# Patient Record
Sex: Female | Born: 1955 | Hispanic: No | Marital: Single | Smoking: Never smoker
Health system: Southern US, Community
[De-identification: ages and names within clinical notes are randomized; demographics above are authoritative.]

## PROBLEM LIST (undated history)

## (undated) DIAGNOSIS — E785 Hyperlipidemia, unspecified: Secondary | ICD-10-CM

## (undated) DIAGNOSIS — C189 Malignant neoplasm of colon, unspecified: Secondary | ICD-10-CM

## (undated) DIAGNOSIS — I639 Cerebral infarction, unspecified: Secondary | ICD-10-CM

## (undated) DIAGNOSIS — K219 Gastro-esophageal reflux disease without esophagitis: Secondary | ICD-10-CM

## (undated) DIAGNOSIS — A159 Respiratory tuberculosis unspecified: Secondary | ICD-10-CM

## (undated) DIAGNOSIS — D649 Anemia, unspecified: Secondary | ICD-10-CM

## (undated) HISTORY — DX: Respiratory tuberculosis unspecified: A15.9

## (undated) HISTORY — DX: Anemia, unspecified: D64.9

## (undated) HISTORY — PX: COLONOSCOPY: SHX174

## (undated) HISTORY — PX: CHOLECYSTECTOMY: SHX55

## (undated) HISTORY — PX: COLON SURGERY: SHX602

## (undated) HISTORY — DX: Gastro-esophageal reflux disease without esophagitis: K21.9

## (undated) HISTORY — PX: PARTIAL HYSTERECTOMY: SHX80

## (undated) HISTORY — PX: UPPER GASTROINTESTINAL ENDOSCOPY: SHX188

## (undated) HISTORY — DX: Hyperlipidemia, unspecified: E78.5

---

## 1997-11-30 ENCOUNTER — Inpatient Hospital Stay (HOSPITAL_COMMUNITY): Admission: AD | Admit: 1997-11-30 | Discharge: 1997-11-30 | Payer: Self-pay | Admitting: Emergency Medicine

## 1997-12-07 ENCOUNTER — Other Ambulatory Visit: Admission: RE | Admit: 1997-12-07 | Discharge: 1997-12-07 | Payer: Self-pay | Admitting: Obstetrics

## 1997-12-07 ENCOUNTER — Ambulatory Visit (HOSPITAL_COMMUNITY): Admission: RE | Admit: 1997-12-07 | Discharge: 1997-12-07 | Payer: Self-pay | Admitting: Obstetrics

## 1998-08-20 ENCOUNTER — Inpatient Hospital Stay (HOSPITAL_COMMUNITY): Admission: AD | Admit: 1998-08-20 | Discharge: 1998-08-20 | Payer: Self-pay | Admitting: Obstetrics

## 1999-01-29 ENCOUNTER — Emergency Department (HOSPITAL_COMMUNITY): Admission: EM | Admit: 1999-01-29 | Discharge: 1999-01-29 | Payer: Self-pay | Admitting: Emergency Medicine

## 1999-03-25 ENCOUNTER — Encounter: Payer: Self-pay | Admitting: Emergency Medicine

## 1999-03-25 ENCOUNTER — Emergency Department (HOSPITAL_COMMUNITY): Admission: EM | Admit: 1999-03-25 | Discharge: 1999-03-25 | Payer: Self-pay | Admitting: Emergency Medicine

## 1999-09-01 DIAGNOSIS — E1165 Type 2 diabetes mellitus with hyperglycemia: Secondary | ICD-10-CM

## 1999-10-21 ENCOUNTER — Emergency Department (HOSPITAL_COMMUNITY): Admission: EM | Admit: 1999-10-21 | Discharge: 1999-10-22 | Payer: Self-pay | Admitting: *Deleted

## 1999-12-12 ENCOUNTER — Emergency Department (HOSPITAL_COMMUNITY): Admission: EM | Admit: 1999-12-12 | Discharge: 1999-12-12 | Payer: Self-pay | Admitting: Emergency Medicine

## 1999-12-26 ENCOUNTER — Emergency Department (HOSPITAL_COMMUNITY): Admission: EM | Admit: 1999-12-26 | Discharge: 1999-12-26 | Payer: Self-pay | Admitting: Emergency Medicine

## 1999-12-31 ENCOUNTER — Emergency Department (HOSPITAL_COMMUNITY): Admission: EM | Admit: 1999-12-31 | Discharge: 1999-12-31 | Payer: Self-pay | Admitting: Emergency Medicine

## 2000-02-06 ENCOUNTER — Encounter: Admission: RE | Admit: 2000-02-06 | Discharge: 2000-02-06 | Payer: Self-pay | Admitting: Family Medicine

## 2000-02-13 ENCOUNTER — Encounter: Payer: Self-pay | Admitting: *Deleted

## 2000-02-13 ENCOUNTER — Inpatient Hospital Stay (HOSPITAL_COMMUNITY): Admission: EM | Admit: 2000-02-13 | Discharge: 2000-02-17 | Payer: Self-pay | Admitting: *Deleted

## 2000-02-13 ENCOUNTER — Encounter: Payer: Self-pay | Admitting: Neurology

## 2000-02-14 ENCOUNTER — Encounter: Payer: Self-pay | Admitting: Neurology

## 2000-02-21 ENCOUNTER — Encounter: Admission: RE | Admit: 2000-02-21 | Discharge: 2000-05-21 | Payer: Self-pay | Admitting: Neurology

## 2000-04-13 ENCOUNTER — Encounter: Admission: RE | Admit: 2000-04-13 | Discharge: 2000-04-13 | Payer: Self-pay | Admitting: Family Medicine

## 2000-05-11 ENCOUNTER — Encounter: Admission: RE | Admit: 2000-05-11 | Discharge: 2000-08-09 | Payer: Self-pay | Admitting: *Deleted

## 2000-05-12 ENCOUNTER — Encounter: Admission: RE | Admit: 2000-05-12 | Discharge: 2000-05-12 | Payer: Self-pay | Admitting: Family Medicine

## 2000-05-26 ENCOUNTER — Emergency Department (HOSPITAL_COMMUNITY): Admission: EM | Admit: 2000-05-26 | Discharge: 2000-05-26 | Payer: Self-pay | Admitting: Emergency Medicine

## 2000-07-08 ENCOUNTER — Encounter: Payer: Self-pay | Admitting: Neurology

## 2000-07-08 ENCOUNTER — Inpatient Hospital Stay (HOSPITAL_COMMUNITY): Admission: EM | Admit: 2000-07-08 | Discharge: 2000-07-13 | Payer: Self-pay | Admitting: *Deleted

## 2000-07-08 ENCOUNTER — Encounter: Payer: Self-pay | Admitting: *Deleted

## 2000-07-13 ENCOUNTER — Encounter: Payer: Self-pay | Admitting: Physical Medicine & Rehabilitation

## 2000-07-13 ENCOUNTER — Inpatient Hospital Stay (HOSPITAL_COMMUNITY)
Admission: RE | Admit: 2000-07-13 | Discharge: 2000-07-29 | Payer: Self-pay | Admitting: Physical Medicine & Rehabilitation

## 2000-07-31 ENCOUNTER — Encounter: Admission: RE | Admit: 2000-07-31 | Discharge: 2000-07-31 | Payer: Self-pay | Admitting: Family Medicine

## 2000-08-03 ENCOUNTER — Encounter
Admission: RE | Admit: 2000-08-03 | Discharge: 2000-10-07 | Payer: Self-pay | Admitting: Physical Medicine & Rehabilitation

## 2000-08-07 ENCOUNTER — Encounter: Admission: RE | Admit: 2000-08-07 | Discharge: 2000-08-07 | Payer: Self-pay | Admitting: Family Medicine

## 2000-09-30 ENCOUNTER — Encounter: Admission: RE | Admit: 2000-09-30 | Discharge: 2000-09-30 | Payer: Self-pay | Admitting: Family Medicine

## 2000-10-08 ENCOUNTER — Encounter: Admission: RE | Admit: 2000-10-08 | Discharge: 2000-10-08 | Payer: Self-pay | Admitting: Family Medicine

## 2000-11-05 ENCOUNTER — Encounter: Admission: RE | Admit: 2000-11-05 | Discharge: 2000-11-05 | Payer: Self-pay | Admitting: Family Medicine

## 2000-11-06 ENCOUNTER — Encounter: Admission: RE | Admit: 2000-11-06 | Discharge: 2000-11-06 | Payer: Self-pay | Admitting: Family Medicine

## 2000-11-13 ENCOUNTER — Encounter: Admission: RE | Admit: 2000-11-13 | Discharge: 2000-11-13 | Payer: Self-pay | Admitting: Family Medicine

## 2000-11-20 ENCOUNTER — Encounter: Admission: RE | Admit: 2000-11-20 | Discharge: 2000-11-20 | Payer: Self-pay | Admitting: Family Medicine

## 2000-11-24 ENCOUNTER — Encounter: Admission: RE | Admit: 2000-11-24 | Discharge: 2000-11-24 | Payer: Self-pay | Admitting: Family Medicine

## 2000-12-02 ENCOUNTER — Encounter: Admission: RE | Admit: 2000-12-02 | Discharge: 2000-12-02 | Payer: Self-pay | Admitting: Family Medicine

## 2000-12-16 ENCOUNTER — Encounter: Admission: RE | Admit: 2000-12-16 | Discharge: 2000-12-16 | Payer: Self-pay | Admitting: Family Medicine

## 2000-12-29 ENCOUNTER — Encounter: Admission: RE | Admit: 2000-12-29 | Discharge: 2000-12-29 | Payer: Self-pay | Admitting: Family Medicine

## 2001-01-13 ENCOUNTER — Encounter: Admission: RE | Admit: 2001-01-13 | Discharge: 2001-03-29 | Payer: Self-pay | Admitting: Sports Medicine

## 2001-01-20 ENCOUNTER — Encounter: Admission: RE | Admit: 2001-01-20 | Discharge: 2001-01-20 | Payer: Self-pay | Admitting: Family Medicine

## 2001-02-04 ENCOUNTER — Encounter: Admission: RE | Admit: 2001-02-04 | Discharge: 2001-02-04 | Payer: Self-pay | Admitting: Family Medicine

## 2001-03-04 ENCOUNTER — Encounter: Admission: RE | Admit: 2001-03-04 | Discharge: 2001-03-04 | Payer: Self-pay | Admitting: Family Medicine

## 2001-03-09 ENCOUNTER — Encounter: Admission: RE | Admit: 2001-03-09 | Discharge: 2001-03-09 | Payer: Self-pay | Admitting: Family Medicine

## 2001-04-09 ENCOUNTER — Encounter: Admission: RE | Admit: 2001-04-09 | Discharge: 2001-04-09 | Payer: Self-pay | Admitting: Family Medicine

## 2001-04-20 ENCOUNTER — Encounter: Admission: RE | Admit: 2001-04-20 | Discharge: 2001-04-20 | Payer: Self-pay | Admitting: Family Medicine

## 2001-04-21 ENCOUNTER — Inpatient Hospital Stay (HOSPITAL_COMMUNITY): Admission: AD | Admit: 2001-04-21 | Discharge: 2001-05-09 | Payer: Self-pay | Admitting: Family Medicine

## 2001-04-21 ENCOUNTER — Encounter: Admission: RE | Admit: 2001-04-21 | Discharge: 2001-04-21 | Payer: Self-pay | Admitting: Family Medicine

## 2001-04-21 ENCOUNTER — Encounter (INDEPENDENT_AMBULATORY_CARE_PROVIDER_SITE_OTHER): Payer: Self-pay | Admitting: *Deleted

## 2001-04-21 ENCOUNTER — Encounter (INDEPENDENT_AMBULATORY_CARE_PROVIDER_SITE_OTHER): Payer: Self-pay | Admitting: Specialist

## 2001-04-21 DIAGNOSIS — Q211 Atrial septal defect: Secondary | ICD-10-CM

## 2001-04-26 ENCOUNTER — Encounter: Payer: Self-pay | Admitting: Internal Medicine

## 2001-04-27 ENCOUNTER — Encounter (INDEPENDENT_AMBULATORY_CARE_PROVIDER_SITE_OTHER): Payer: Self-pay | Admitting: *Deleted

## 2001-04-27 ENCOUNTER — Encounter: Payer: Self-pay | Admitting: Family Medicine

## 2001-04-28 ENCOUNTER — Encounter: Payer: Self-pay | Admitting: Internal Medicine

## 2001-04-30 ENCOUNTER — Encounter (INDEPENDENT_AMBULATORY_CARE_PROVIDER_SITE_OTHER): Payer: Self-pay | Admitting: *Deleted

## 2001-05-04 ENCOUNTER — Encounter: Payer: Self-pay | Admitting: Family Medicine

## 2001-05-05 ENCOUNTER — Encounter (INDEPENDENT_AMBULATORY_CARE_PROVIDER_SITE_OTHER): Payer: Self-pay | Admitting: *Deleted

## 2001-05-07 ENCOUNTER — Encounter: Payer: Self-pay | Admitting: General Surgery

## 2001-05-07 ENCOUNTER — Encounter: Payer: Self-pay | Admitting: Family Medicine

## 2001-05-10 ENCOUNTER — Encounter: Admission: RE | Admit: 2001-05-10 | Discharge: 2001-05-10 | Payer: Self-pay | Admitting: Family Medicine

## 2001-05-14 ENCOUNTER — Encounter: Admission: RE | Admit: 2001-05-14 | Discharge: 2001-05-14 | Payer: Self-pay | Admitting: Family Medicine

## 2001-06-02 DIAGNOSIS — I6350 Cerebral infarction due to unspecified occlusion or stenosis of unspecified cerebral artery: Secondary | ICD-10-CM | POA: Insufficient documentation

## 2001-06-18 ENCOUNTER — Encounter: Admission: RE | Admit: 2001-06-18 | Discharge: 2001-06-18 | Payer: Self-pay | Admitting: Family Medicine

## 2001-07-09 ENCOUNTER — Encounter: Admission: RE | Admit: 2001-07-09 | Discharge: 2001-07-09 | Payer: Self-pay | Admitting: Family Medicine

## 2001-08-03 ENCOUNTER — Encounter: Admission: RE | Admit: 2001-08-03 | Discharge: 2001-08-03 | Payer: Self-pay | Admitting: Sports Medicine

## 2001-08-10 ENCOUNTER — Encounter: Admission: RE | Admit: 2001-08-10 | Discharge: 2001-08-10 | Payer: Self-pay | Admitting: Sports Medicine

## 2001-08-11 ENCOUNTER — Encounter: Admission: RE | Admit: 2001-08-11 | Discharge: 2001-08-11 | Payer: Self-pay | Admitting: Family Medicine

## 2001-08-13 ENCOUNTER — Encounter: Admission: RE | Admit: 2001-08-13 | Discharge: 2001-08-13 | Payer: Self-pay | Admitting: Family Medicine

## 2001-08-16 ENCOUNTER — Encounter: Payer: Self-pay | Admitting: Family Medicine

## 2001-08-16 ENCOUNTER — Encounter: Admission: RE | Admit: 2001-08-16 | Discharge: 2001-08-16 | Payer: Self-pay | Admitting: Family Medicine

## 2001-08-16 ENCOUNTER — Encounter (INDEPENDENT_AMBULATORY_CARE_PROVIDER_SITE_OTHER): Payer: Self-pay | Admitting: Specialist

## 2001-08-17 ENCOUNTER — Encounter (INDEPENDENT_AMBULATORY_CARE_PROVIDER_SITE_OTHER): Payer: Self-pay | Admitting: *Deleted

## 2001-08-17 ENCOUNTER — Encounter: Payer: Self-pay | Admitting: Family Medicine

## 2001-08-17 ENCOUNTER — Inpatient Hospital Stay (HOSPITAL_COMMUNITY): Admission: AD | Admit: 2001-08-17 | Discharge: 2001-08-25 | Payer: Self-pay | Admitting: Emergency Medicine

## 2001-08-20 ENCOUNTER — Encounter: Payer: Self-pay | Admitting: Family Medicine

## 2001-08-20 ENCOUNTER — Encounter (INDEPENDENT_AMBULATORY_CARE_PROVIDER_SITE_OTHER): Payer: Self-pay | Admitting: *Deleted

## 2001-08-20 ENCOUNTER — Encounter: Admission: RE | Admit: 2001-08-20 | Discharge: 2001-08-20 | Payer: Self-pay | Admitting: Family Medicine

## 2001-08-21 ENCOUNTER — Encounter: Payer: Self-pay | Admitting: Family Medicine

## 2001-08-24 ENCOUNTER — Encounter: Payer: Self-pay | Admitting: Family Medicine

## 2001-09-01 ENCOUNTER — Encounter: Admission: RE | Admit: 2001-09-01 | Discharge: 2001-09-01 | Payer: Self-pay | Admitting: Family Medicine

## 2001-09-08 ENCOUNTER — Encounter: Admission: RE | Admit: 2001-09-08 | Discharge: 2001-09-08 | Payer: Self-pay | Admitting: Family Medicine

## 2001-10-29 ENCOUNTER — Encounter: Admission: RE | Admit: 2001-10-29 | Discharge: 2001-10-29 | Payer: Self-pay | Admitting: Family Medicine

## 2001-11-08 ENCOUNTER — Encounter: Admission: RE | Admit: 2001-11-08 | Discharge: 2001-11-08 | Payer: Self-pay | Admitting: Family Medicine

## 2001-11-18 ENCOUNTER — Encounter: Admission: RE | Admit: 2001-11-18 | Discharge: 2001-11-18 | Payer: Self-pay | Admitting: Family Medicine

## 2001-11-29 ENCOUNTER — Encounter: Admission: RE | Admit: 2001-11-29 | Discharge: 2001-11-29 | Payer: Self-pay | Admitting: Family Medicine

## 2001-12-23 ENCOUNTER — Emergency Department (HOSPITAL_COMMUNITY): Admission: EM | Admit: 2001-12-23 | Discharge: 2001-12-24 | Payer: Self-pay | Admitting: Emergency Medicine

## 2001-12-24 ENCOUNTER — Encounter: Payer: Self-pay | Admitting: Emergency Medicine

## 2002-02-02 ENCOUNTER — Encounter: Payer: Self-pay | Admitting: Internal Medicine

## 2002-04-21 ENCOUNTER — Observation Stay (HOSPITAL_COMMUNITY): Admission: EM | Admit: 2002-04-21 | Discharge: 2002-04-22 | Payer: Self-pay | Admitting: Emergency Medicine

## 2002-12-01 ENCOUNTER — Emergency Department (HOSPITAL_COMMUNITY): Admission: EM | Admit: 2002-12-01 | Discharge: 2002-12-01 | Payer: Self-pay | Admitting: Emergency Medicine

## 2002-12-02 ENCOUNTER — Encounter (INDEPENDENT_AMBULATORY_CARE_PROVIDER_SITE_OTHER): Payer: Self-pay | Admitting: *Deleted

## 2002-12-02 ENCOUNTER — Encounter: Payer: Self-pay | Admitting: Emergency Medicine

## 2003-02-17 ENCOUNTER — Emergency Department (HOSPITAL_COMMUNITY): Admission: EM | Admit: 2003-02-17 | Discharge: 2003-02-18 | Payer: Self-pay | Admitting: Emergency Medicine

## 2003-02-18 ENCOUNTER — Encounter: Payer: Self-pay | Admitting: Emergency Medicine

## 2003-10-03 ENCOUNTER — Other Ambulatory Visit: Admission: RE | Admit: 2003-10-03 | Discharge: 2003-10-03 | Payer: Self-pay | Admitting: Obstetrics and Gynecology

## 2003-10-07 ENCOUNTER — Emergency Department (HOSPITAL_COMMUNITY): Admission: EM | Admit: 2003-10-07 | Discharge: 2003-10-07 | Payer: Self-pay | Admitting: Emergency Medicine

## 2004-02-26 ENCOUNTER — Emergency Department (HOSPITAL_COMMUNITY): Admission: EM | Admit: 2004-02-26 | Discharge: 2004-02-27 | Payer: Self-pay | Admitting: Emergency Medicine

## 2004-06-28 ENCOUNTER — Ambulatory Visit: Payer: Self-pay | Admitting: Family Medicine

## 2004-07-10 ENCOUNTER — Encounter: Admission: RE | Admit: 2004-07-10 | Discharge: 2004-10-08 | Payer: Self-pay | Admitting: Family Medicine

## 2004-07-12 ENCOUNTER — Ambulatory Visit: Payer: Self-pay | Admitting: Family Medicine

## 2004-08-10 ENCOUNTER — Emergency Department (HOSPITAL_COMMUNITY): Admission: EM | Admit: 2004-08-10 | Discharge: 2004-08-10 | Payer: Self-pay | Admitting: Emergency Medicine

## 2004-08-13 ENCOUNTER — Ambulatory Visit: Payer: Self-pay | Admitting: Family Medicine

## 2004-09-04 ENCOUNTER — Ambulatory Visit: Payer: Self-pay | Admitting: Family Medicine

## 2004-09-20 ENCOUNTER — Emergency Department (HOSPITAL_COMMUNITY): Admission: EM | Admit: 2004-09-20 | Discharge: 2004-09-20 | Payer: Self-pay | Admitting: Emergency Medicine

## 2004-10-14 ENCOUNTER — Ambulatory Visit: Payer: Self-pay | Admitting: Family Medicine

## 2004-10-16 ENCOUNTER — Ambulatory Visit: Payer: Self-pay | Admitting: Family Medicine

## 2004-11-08 ENCOUNTER — Ambulatory Visit: Payer: Self-pay | Admitting: Family Medicine

## 2004-11-12 ENCOUNTER — Ambulatory Visit: Payer: Self-pay | Admitting: Family Medicine

## 2004-11-26 ENCOUNTER — Ambulatory Visit: Payer: Self-pay | Admitting: Family Medicine

## 2004-11-28 ENCOUNTER — Encounter (INDEPENDENT_AMBULATORY_CARE_PROVIDER_SITE_OTHER): Payer: Self-pay | Admitting: *Deleted

## 2004-11-28 ENCOUNTER — Observation Stay (HOSPITAL_COMMUNITY): Admission: RE | Admit: 2004-11-28 | Discharge: 2004-11-29 | Payer: Self-pay | Admitting: Obstetrics and Gynecology

## 2004-12-09 ENCOUNTER — Emergency Department (HOSPITAL_COMMUNITY): Admission: EM | Admit: 2004-12-09 | Discharge: 2004-12-09 | Payer: Self-pay | Admitting: Emergency Medicine

## 2004-12-27 ENCOUNTER — Ambulatory Visit: Payer: Self-pay | Admitting: Family Medicine

## 2005-01-17 ENCOUNTER — Ambulatory Visit: Payer: Self-pay | Admitting: Family Medicine

## 2005-02-12 ENCOUNTER — Ambulatory Visit: Payer: Self-pay | Admitting: Family Medicine

## 2005-03-06 ENCOUNTER — Ambulatory Visit: Payer: Self-pay | Admitting: Family Medicine

## 2005-03-21 ENCOUNTER — Ambulatory Visit: Payer: Self-pay | Admitting: Family Medicine

## 2005-04-11 ENCOUNTER — Ambulatory Visit: Payer: Self-pay | Admitting: Family Medicine

## 2005-04-21 ENCOUNTER — Ambulatory Visit: Payer: Self-pay | Admitting: Family Medicine

## 2005-05-16 ENCOUNTER — Ambulatory Visit: Payer: Self-pay | Admitting: Family Medicine

## 2005-06-06 ENCOUNTER — Ambulatory Visit: Payer: Self-pay | Admitting: Family Medicine

## 2005-06-13 ENCOUNTER — Ambulatory Visit: Payer: Self-pay | Admitting: Family Medicine

## 2005-06-18 ENCOUNTER — Emergency Department (HOSPITAL_COMMUNITY): Admission: EM | Admit: 2005-06-18 | Discharge: 2005-06-18 | Payer: Self-pay | Admitting: Emergency Medicine

## 2005-06-30 ENCOUNTER — Ambulatory Visit: Payer: Self-pay | Admitting: Family Medicine

## 2005-07-02 ENCOUNTER — Ambulatory Visit: Payer: Self-pay | Admitting: Family Medicine

## 2005-08-14 ENCOUNTER — Emergency Department (HOSPITAL_COMMUNITY): Admission: EM | Admit: 2005-08-14 | Discharge: 2005-08-14 | Payer: Self-pay | Admitting: Emergency Medicine

## 2005-08-15 ENCOUNTER — Ambulatory Visit: Payer: Self-pay | Admitting: Internal Medicine

## 2005-09-16 ENCOUNTER — Ambulatory Visit: Payer: Self-pay | Admitting: Family Medicine

## 2005-10-10 ENCOUNTER — Ambulatory Visit: Payer: Self-pay | Admitting: Family Medicine

## 2005-11-17 ENCOUNTER — Ambulatory Visit: Payer: Self-pay | Admitting: Family Medicine

## 2005-12-02 ENCOUNTER — Ambulatory Visit: Payer: Self-pay | Admitting: Family Medicine

## 2005-12-04 ENCOUNTER — Ambulatory Visit: Payer: Self-pay | Admitting: Internal Medicine

## 2005-12-27 ENCOUNTER — Emergency Department (HOSPITAL_COMMUNITY): Admission: EM | Admit: 2005-12-27 | Discharge: 2005-12-27 | Payer: Self-pay | Admitting: Emergency Medicine

## 2005-12-29 ENCOUNTER — Ambulatory Visit: Payer: Self-pay | Admitting: Family Medicine

## 2006-02-06 ENCOUNTER — Ambulatory Visit: Payer: Self-pay | Admitting: Family Medicine

## 2006-02-13 ENCOUNTER — Ambulatory Visit: Payer: Self-pay | Admitting: Family Medicine

## 2006-02-27 ENCOUNTER — Ambulatory Visit: Payer: Self-pay | Admitting: Family Medicine

## 2006-03-09 ENCOUNTER — Ambulatory Visit: Payer: Self-pay | Admitting: Family Medicine

## 2006-03-20 ENCOUNTER — Ambulatory Visit: Payer: Self-pay | Admitting: Family Medicine

## 2006-04-10 ENCOUNTER — Ambulatory Visit: Payer: Self-pay | Admitting: Family Medicine

## 2006-05-08 ENCOUNTER — Ambulatory Visit: Payer: Self-pay | Admitting: Family Medicine

## 2006-05-20 ENCOUNTER — Ambulatory Visit: Payer: Self-pay | Admitting: Family Medicine

## 2006-06-03 ENCOUNTER — Ambulatory Visit: Payer: Self-pay | Admitting: Family Medicine

## 2006-06-09 ENCOUNTER — Ambulatory Visit: Payer: Self-pay | Admitting: Family Medicine

## 2006-07-01 ENCOUNTER — Ambulatory Visit: Payer: Self-pay | Admitting: Family Medicine

## 2006-07-03 ENCOUNTER — Ambulatory Visit: Payer: Self-pay | Admitting: Family Medicine

## 2006-07-20 ENCOUNTER — Emergency Department (HOSPITAL_COMMUNITY): Admission: EM | Admit: 2006-07-20 | Discharge: 2006-07-20 | Payer: Self-pay | Admitting: Emergency Medicine

## 2006-07-22 ENCOUNTER — Inpatient Hospital Stay (HOSPITAL_COMMUNITY): Admission: EM | Admit: 2006-07-22 | Discharge: 2006-07-24 | Payer: Self-pay | Admitting: Emergency Medicine

## 2006-07-22 ENCOUNTER — Ambulatory Visit: Payer: Self-pay | Admitting: Family Medicine

## 2006-07-23 ENCOUNTER — Ambulatory Visit: Payer: Self-pay | Admitting: Internal Medicine

## 2006-07-28 ENCOUNTER — Ambulatory Visit: Payer: Self-pay | Admitting: Family Medicine

## 2006-08-11 ENCOUNTER — Ambulatory Visit: Payer: Self-pay | Admitting: Family Medicine

## 2006-08-11 ENCOUNTER — Ambulatory Visit: Payer: Self-pay | Admitting: Endocrinology

## 2006-08-12 ENCOUNTER — Emergency Department (HOSPITAL_COMMUNITY): Admission: EM | Admit: 2006-08-12 | Discharge: 2006-08-12 | Payer: Self-pay | Admitting: Emergency Medicine

## 2006-09-03 ENCOUNTER — Encounter: Admission: RE | Admit: 2006-09-03 | Discharge: 2006-09-03 | Payer: Self-pay | Admitting: Occupational Medicine

## 2006-11-10 ENCOUNTER — Ambulatory Visit: Payer: Self-pay | Admitting: Family Medicine

## 2006-11-10 DIAGNOSIS — E785 Hyperlipidemia, unspecified: Secondary | ICD-10-CM

## 2006-11-10 DIAGNOSIS — I10 Essential (primary) hypertension: Secondary | ICD-10-CM | POA: Insufficient documentation

## 2006-11-25 ENCOUNTER — Ambulatory Visit: Payer: Self-pay | Admitting: Family Medicine

## 2006-12-27 ENCOUNTER — Emergency Department (HOSPITAL_COMMUNITY): Admission: EM | Admit: 2006-12-27 | Discharge: 2006-12-27 | Payer: Self-pay | Admitting: Emergency Medicine

## 2007-01-19 ENCOUNTER — Ambulatory Visit: Payer: Self-pay | Admitting: Family Medicine

## 2007-01-19 DIAGNOSIS — J019 Acute sinusitis, unspecified: Secondary | ICD-10-CM

## 2007-01-19 DIAGNOSIS — D518 Other vitamin B12 deficiency anemias: Secondary | ICD-10-CM

## 2007-02-09 ENCOUNTER — Encounter: Payer: Self-pay | Admitting: Endocrinology

## 2007-02-09 DIAGNOSIS — Z860100 Personal history of colon polyps, unspecified: Secondary | ICD-10-CM | POA: Insufficient documentation

## 2007-04-02 ENCOUNTER — Ambulatory Visit: Payer: Self-pay | Admitting: Family Medicine

## 2007-04-02 LAB — CONVERTED CEMR LAB

## 2007-05-07 ENCOUNTER — Ambulatory Visit: Payer: Self-pay | Admitting: Family Medicine

## 2007-05-07 LAB — CONVERTED CEMR LAB: Prothrombin Time: 14.3 s

## 2007-06-09 ENCOUNTER — Ambulatory Visit: Payer: Self-pay | Admitting: Family Medicine

## 2007-06-09 LAB — CONVERTED CEMR LAB
Bilirubin Urine: NEGATIVE
INR: 3.9
Prothrombin Time: 23.8 s
Specific Gravity, Urine: 1.01
WBC Urine, dipstick: NEGATIVE
pH: 5.5

## 2007-06-14 ENCOUNTER — Telehealth: Payer: Self-pay | Admitting: Family Medicine

## 2007-06-14 LAB — CONVERTED CEMR LAB
ALT: 16 units/L (ref 0–35)
AST: 19 units/L (ref 0–37)
Alkaline Phosphatase: 101 units/L (ref 39–117)
BUN: 13 mg/dL (ref 6–23)
Basophils Relative: 0 % (ref 0.0–1.0)
Bilirubin, Direct: 0.1 mg/dL (ref 0.0–0.3)
CO2: 23 meq/L (ref 19–32)
Calcium: 8.6 mg/dL (ref 8.4–10.5)
Chloride: 98 meq/L (ref 96–112)
Eosinophils Relative: 2.3 % (ref 0.0–5.0)
GFR calc Af Amer: 98 mL/min
GFR calc non Af Amer: 81 mL/min
Glucose, Bld: 367 mg/dL — ABNORMAL HIGH (ref 70–99)
Monocytes Relative: 6.6 % (ref 3.0–11.0)
Platelets: 227 10*3/uL (ref 150–400)
RBC: 4.95 M/uL (ref 3.87–5.11)
WBC: 5.8 10*3/uL (ref 4.5–10.5)

## 2007-06-23 ENCOUNTER — Telehealth: Payer: Self-pay | Admitting: Family Medicine

## 2007-06-24 ENCOUNTER — Ambulatory Visit: Payer: Self-pay | Admitting: Family Medicine

## 2007-07-01 ENCOUNTER — Telehealth: Payer: Self-pay | Admitting: Family Medicine

## 2007-07-04 ENCOUNTER — Emergency Department (HOSPITAL_COMMUNITY): Admission: EM | Admit: 2007-07-04 | Discharge: 2007-07-05 | Payer: Self-pay | Admitting: Emergency Medicine

## 2007-08-05 ENCOUNTER — Telehealth: Payer: Self-pay | Admitting: Family Medicine

## 2007-08-16 ENCOUNTER — Ambulatory Visit: Payer: Self-pay | Admitting: Family Medicine

## 2007-08-17 ENCOUNTER — Encounter: Payer: Self-pay | Admitting: Family Medicine

## 2007-08-20 ENCOUNTER — Ambulatory Visit: Payer: Self-pay | Admitting: Family Medicine

## 2007-08-20 LAB — CONVERTED CEMR LAB
INR: 1.1
Prothrombin Time: 12.7 s

## 2007-09-20 ENCOUNTER — Ambulatory Visit: Payer: Self-pay | Admitting: Family Medicine

## 2007-09-20 LAB — CONVERTED CEMR LAB: INR: 4.1

## 2007-09-27 ENCOUNTER — Telehealth: Payer: Self-pay | Admitting: Family Medicine

## 2007-10-07 ENCOUNTER — Ambulatory Visit: Payer: Self-pay | Admitting: Family Medicine

## 2007-10-07 DIAGNOSIS — R040 Epistaxis: Secondary | ICD-10-CM | POA: Insufficient documentation

## 2007-10-07 LAB — CONVERTED CEMR LAB
INR: 5
Prothrombin Time: 27.3 s

## 2007-11-19 ENCOUNTER — Ambulatory Visit: Payer: Self-pay | Admitting: Family Medicine

## 2007-11-19 LAB — CONVERTED CEMR LAB: Prothrombin Time: 15.1 s

## 2007-12-06 ENCOUNTER — Encounter: Payer: Self-pay | Admitting: Family Medicine

## 2007-12-06 ENCOUNTER — Encounter: Admission: RE | Admit: 2007-12-06 | Discharge: 2007-12-06 | Payer: Self-pay | Admitting: Family Medicine

## 2008-01-26 ENCOUNTER — Telehealth: Payer: Self-pay | Admitting: Family Medicine

## 2008-01-28 ENCOUNTER — Ambulatory Visit: Payer: Self-pay | Admitting: Family Medicine

## 2008-01-31 ENCOUNTER — Encounter: Payer: Self-pay | Admitting: Family Medicine

## 2008-04-30 ENCOUNTER — Emergency Department (HOSPITAL_COMMUNITY): Admission: EM | Admit: 2008-04-30 | Discharge: 2008-04-30 | Payer: Self-pay | Admitting: Emergency Medicine

## 2008-04-30 ENCOUNTER — Encounter (INDEPENDENT_AMBULATORY_CARE_PROVIDER_SITE_OTHER): Payer: Self-pay | Admitting: *Deleted

## 2008-05-05 ENCOUNTER — Ambulatory Visit: Payer: Self-pay | Admitting: Family Medicine

## 2008-05-05 DIAGNOSIS — R079 Chest pain, unspecified: Secondary | ICD-10-CM

## 2008-05-05 LAB — CONVERTED CEMR LAB
INR: 0.9
Prothrombin Time: 11.8 s

## 2008-06-19 ENCOUNTER — Ambulatory Visit: Payer: Self-pay | Admitting: Family Medicine

## 2008-06-19 LAB — CONVERTED CEMR LAB
INR: 1.5
Prothrombin Time: 15.1 s

## 2008-07-13 ENCOUNTER — Ambulatory Visit: Payer: Self-pay | Admitting: Internal Medicine

## 2008-07-13 LAB — CONVERTED CEMR LAB
Blood Glucose, Fingerstick: 188
Hgb A1c MFr Bld: 9.1 %

## 2008-07-14 ENCOUNTER — Telehealth (INDEPENDENT_AMBULATORY_CARE_PROVIDER_SITE_OTHER): Payer: Self-pay | Admitting: Internal Medicine

## 2008-07-15 LAB — CONVERTED CEMR LAB
LDL Cholesterol: 109 mg/dL — ABNORMAL HIGH (ref 0–99)
Microalb, Ur: 0.55 mg/dL (ref 0.00–1.89)
Total CHOL/HDL Ratio: 3.3
VLDL: 36 mg/dL (ref 0–40)

## 2008-07-20 ENCOUNTER — Ambulatory Visit: Payer: Self-pay | Admitting: *Deleted

## 2008-07-20 ENCOUNTER — Ambulatory Visit: Payer: Self-pay | Admitting: Internal Medicine

## 2008-07-26 ENCOUNTER — Ambulatory Visit: Payer: Self-pay | Admitting: Family Medicine

## 2008-07-26 DIAGNOSIS — H60399 Other infective otitis externa, unspecified ear: Secondary | ICD-10-CM | POA: Insufficient documentation

## 2008-08-08 ENCOUNTER — Ambulatory Visit: Payer: Self-pay | Admitting: Internal Medicine

## 2008-08-10 ENCOUNTER — Ambulatory Visit: Payer: Self-pay | Admitting: Internal Medicine

## 2008-08-24 ENCOUNTER — Ambulatory Visit: Payer: Self-pay | Admitting: Internal Medicine

## 2008-08-24 LAB — CONVERTED CEMR LAB: Blood Glucose, Fingerstick: 156

## 2008-08-31 LAB — CONVERTED CEMR LAB: Prothrombin Time: 17.3 s — ABNORMAL HIGH (ref 11.6–15.2)

## 2008-10-06 ENCOUNTER — Ambulatory Visit: Payer: Self-pay | Admitting: Internal Medicine

## 2008-10-16 LAB — CONVERTED CEMR LAB
AST: 14 units/L (ref 0–37)
Albumin: 3.9 g/dL (ref 3.5–5.2)
Bilirubin, Direct: 0.1 mg/dL (ref 0.0–0.3)
HDL: 49 mg/dL (ref >39)
INR: 1.8 — ABNORMAL HIGH (ref 0.0–1.5)
LDL Cholesterol: 37 mg/dL (ref 0–99)
Total Bilirubin: 0.3 mg/dL (ref 0.3–1.2)
Total CHOL/HDL Ratio: 2.1
VLDL: 19 mg/dL (ref 0–40)

## 2008-11-06 ENCOUNTER — Ambulatory Visit: Payer: Self-pay | Admitting: Family Medicine

## 2008-11-06 DIAGNOSIS — B373 Candidiasis of vulva and vagina: Secondary | ICD-10-CM

## 2008-11-06 LAB — CONVERTED CEMR LAB
Blood Glucose, Fingerstick: 254
Hgb A1c MFr Bld: 8.5 %
Whiff Test: NEGATIVE

## 2008-12-08 ENCOUNTER — Ambulatory Visit: Payer: Self-pay | Admitting: Internal Medicine

## 2008-12-17 LAB — CONVERTED CEMR LAB: Vitamin B-12: >2000 pg/mL — ABNORMAL HIGH (ref 211–911)

## 2008-12-19 ENCOUNTER — Encounter (INDEPENDENT_AMBULATORY_CARE_PROVIDER_SITE_OTHER): Payer: Self-pay | Admitting: Internal Medicine

## 2008-12-25 ENCOUNTER — Encounter (INDEPENDENT_AMBULATORY_CARE_PROVIDER_SITE_OTHER): Payer: Self-pay | Admitting: Internal Medicine

## 2009-01-21 LAB — CONVERTED CEMR LAB: Prothrombin Time: 32.7 s — ABNORMAL HIGH (ref 11.6–15.2)

## 2009-01-25 ENCOUNTER — Ambulatory Visit: Payer: Self-pay | Admitting: Internal Medicine

## 2009-01-25 DIAGNOSIS — R32 Unspecified urinary incontinence: Secondary | ICD-10-CM

## 2009-01-25 DIAGNOSIS — L293 Anogenital pruritus, unspecified: Secondary | ICD-10-CM | POA: Insufficient documentation

## 2009-01-30 ENCOUNTER — Ambulatory Visit (HOSPITAL_COMMUNITY): Admission: RE | Admit: 2009-01-30 | Discharge: 2009-01-30 | Payer: Self-pay | Admitting: Internal Medicine

## 2009-02-09 ENCOUNTER — Encounter (INDEPENDENT_AMBULATORY_CARE_PROVIDER_SITE_OTHER): Payer: Self-pay | Admitting: Internal Medicine

## 2009-03-05 ENCOUNTER — Emergency Department (HOSPITAL_COMMUNITY): Admission: EM | Admit: 2009-03-05 | Discharge: 2009-03-05 | Payer: Self-pay | Admitting: Emergency Medicine

## 2009-03-08 LAB — CONVERTED CEMR LAB
BUN: 13 mg/dL (ref 6–23)
Calcium: 8.7 mg/dL (ref 8.4–10.5)
Chlamydia, DNA Probe: NEGATIVE
GC Probe Amp, Genital: NEGATIVE
Glucose, Bld: 219 mg/dL — ABNORMAL HIGH (ref 70–99)
Prothrombin Time: 25.8 s — ABNORMAL HIGH (ref 11.6–15.2)
Sodium: 141 meq/L (ref 135–145)

## 2009-03-09 ENCOUNTER — Telehealth (INDEPENDENT_AMBULATORY_CARE_PROVIDER_SITE_OTHER): Payer: Self-pay | Admitting: Internal Medicine

## 2009-03-09 ENCOUNTER — Ambulatory Visit: Payer: Self-pay | Admitting: Internal Medicine

## 2009-03-09 LAB — CONVERTED CEMR LAB
Glucose, Urine, Semiquant: >=1000
Ketones, urine, test strip: NEGATIVE
Nitrite: NEGATIVE
WBC Urine, dipstick: NEGATIVE
pH: 5.5

## 2009-03-19 LAB — CONVERTED CEMR LAB
INR: 2 — ABNORMAL HIGH (ref 0.0–1.5)
Prothrombin Time: 22.2 s — ABNORMAL HIGH (ref 11.6–15.2)

## 2009-03-20 ENCOUNTER — Encounter (INDEPENDENT_AMBULATORY_CARE_PROVIDER_SITE_OTHER): Payer: Self-pay | Admitting: Internal Medicine

## 2009-03-21 ENCOUNTER — Encounter (INDEPENDENT_AMBULATORY_CARE_PROVIDER_SITE_OTHER): Payer: Self-pay | Admitting: Internal Medicine

## 2009-04-03 ENCOUNTER — Encounter (INDEPENDENT_AMBULATORY_CARE_PROVIDER_SITE_OTHER): Payer: Self-pay | Admitting: Internal Medicine

## 2009-06-19 ENCOUNTER — Ambulatory Visit: Payer: Self-pay | Admitting: Internal Medicine

## 2009-06-20 ENCOUNTER — Telehealth (INDEPENDENT_AMBULATORY_CARE_PROVIDER_SITE_OTHER): Payer: Self-pay | Admitting: Internal Medicine

## 2009-06-26 ENCOUNTER — Ambulatory Visit: Payer: Self-pay | Admitting: Internal Medicine

## 2009-07-05 LAB — CONVERTED CEMR LAB: INR: 2.43 — ABNORMAL HIGH (ref <1.50)

## 2009-07-06 ENCOUNTER — Ambulatory Visit: Payer: Self-pay | Admitting: Physician Assistant

## 2009-07-06 DIAGNOSIS — J069 Acute upper respiratory infection, unspecified: Secondary | ICD-10-CM | POA: Insufficient documentation

## 2009-07-06 DIAGNOSIS — B86 Scabies: Secondary | ICD-10-CM

## 2009-07-06 LAB — CONVERTED CEMR LAB: Blood Glucose, Fingerstick: 182

## 2009-08-07 ENCOUNTER — Ambulatory Visit: Payer: Self-pay | Admitting: Internal Medicine

## 2009-08-08 LAB — CONVERTED CEMR LAB
INR: 2.19 — ABNORMAL HIGH (ref <1.50)
Prothrombin Time: 24.2 s — ABNORMAL HIGH (ref 11.6–15.2)

## 2009-09-12 ENCOUNTER — Ambulatory Visit: Payer: Self-pay | Admitting: Internal Medicine

## 2009-09-17 LAB — CONVERTED CEMR LAB: Prothrombin Time: 28.3 s — ABNORMAL HIGH (ref 11.6–15.2)

## 2009-12-08 ENCOUNTER — Telehealth (INDEPENDENT_AMBULATORY_CARE_PROVIDER_SITE_OTHER): Payer: Self-pay | Admitting: Internal Medicine

## 2009-12-19 ENCOUNTER — Telehealth: Payer: Self-pay | Admitting: Internal Medicine

## 2010-01-17 ENCOUNTER — Encounter (INDEPENDENT_AMBULATORY_CARE_PROVIDER_SITE_OTHER): Payer: Self-pay | Admitting: Internal Medicine

## 2010-01-24 ENCOUNTER — Ambulatory Visit: Payer: Self-pay | Admitting: Internal Medicine

## 2010-02-13 ENCOUNTER — Encounter (INDEPENDENT_AMBULATORY_CARE_PROVIDER_SITE_OTHER): Payer: Self-pay | Admitting: *Deleted

## 2010-02-20 ENCOUNTER — Ambulatory Visit: Payer: Self-pay | Admitting: Internal Medicine

## 2010-02-28 LAB — CONVERTED CEMR LAB
AST: 17 units/L (ref 0–37)
BUN: 13 mg/dL (ref 6–23)
Basophils Absolute: 0 10*3/uL (ref 0.0–0.1)
Basophils Relative: 0 % (ref 0–1)
Calcium: 9.1 mg/dL (ref 8.4–10.5)
Chloride: 108 meq/L (ref 96–112)
Creatinine, Ser: 0.67 mg/dL (ref 0.40–1.20)
Eosinophils Absolute: 0.2 10*3/uL (ref 0.0–0.7)
Eosinophils Relative: 4 % (ref 0–5)
HCT: 35 % — ABNORMAL LOW (ref 36.0–46.0)
HDL: 60 mg/dL (ref >39)
Lymphs Abs: 1.6 10*3/uL (ref 0.7–4.0)
MCHC: 29.7 g/dL — ABNORMAL LOW (ref 30.0–36.0)
MCV: 78.3 fL (ref 78.0–100.0)
Platelets: 200 10*3/uL (ref 150–400)
Prothrombin Time: 27.7 s — ABNORMAL HIGH (ref 11.6–15.2)
RDW: 16.5 % — ABNORMAL HIGH (ref 11.5–15.5)
Total CHOL/HDL Ratio: 1.8

## 2010-03-04 ENCOUNTER — Telehealth (INDEPENDENT_AMBULATORY_CARE_PROVIDER_SITE_OTHER): Payer: Self-pay | Admitting: Internal Medicine

## 2010-03-11 ENCOUNTER — Ambulatory Visit: Payer: Self-pay | Admitting: Internal Medicine

## 2010-03-11 LAB — CONVERTED CEMR LAB
Folate: 15.8 ng/mL
Iron: 94 ug/dL (ref 42–145)
Vitamin B-12: 784 pg/mL (ref 211–911)

## 2010-03-21 ENCOUNTER — Encounter (INDEPENDENT_AMBULATORY_CARE_PROVIDER_SITE_OTHER): Payer: Self-pay | Admitting: *Deleted

## 2010-04-02 ENCOUNTER — Ambulatory Visit: Payer: Self-pay | Admitting: Internal Medicine

## 2010-04-02 ENCOUNTER — Encounter: Payer: Self-pay | Admitting: Internal Medicine

## 2010-04-02 ENCOUNTER — Telehealth: Payer: Self-pay | Admitting: Internal Medicine

## 2010-04-02 DIAGNOSIS — D649 Anemia, unspecified: Secondary | ICD-10-CM | POA: Insufficient documentation

## 2010-04-02 LAB — CONVERTED CEMR LAB
Bilirubin Urine: NEGATIVE
Glucose, Urine, Semiquant: NEGATIVE
Protein, U semiquant: NEGATIVE
Rapid HIV Screen: NEGATIVE
Specific Gravity, Urine: >=1.03
pH: 5

## 2010-04-05 ENCOUNTER — Telehealth (INDEPENDENT_AMBULATORY_CARE_PROVIDER_SITE_OTHER): Payer: Self-pay | Admitting: *Deleted

## 2010-04-05 DIAGNOSIS — K633 Ulcer of intestine: Secondary | ICD-10-CM

## 2010-04-06 LAB — CONVERTED CEMR LAB
Basophils Absolute: 0 10*3/uL (ref 0.0–0.1)
Chlamydia, Swab/Urine, PCR: NEGATIVE
GC Probe Amp, Urine: NEGATIVE
Hgb A1c MFr Bld: 6.5 % — ABNORMAL HIGH (ref <5.7)
Lymphocytes Relative: 25 % (ref 12–46)
Neutro Abs: 4.3 10*3/uL (ref 1.7–7.7)
Neutrophils Relative %: 66 % (ref 43–77)
Platelets: 240 10*3/uL (ref 150–400)
Prothrombin Time: 23.1 s — ABNORMAL HIGH (ref 11.6–15.2)
RDW: 17.4 % — ABNORMAL HIGH (ref 11.5–15.5)

## 2010-04-17 ENCOUNTER — Ambulatory Visit: Payer: Self-pay | Admitting: Internal Medicine

## 2010-04-24 ENCOUNTER — Ambulatory Visit (HOSPITAL_COMMUNITY): Admission: RE | Admit: 2010-04-24 | Discharge: 2010-04-24 | Payer: Self-pay | Admitting: Internal Medicine

## 2010-05-02 ENCOUNTER — Ambulatory Visit: Payer: Self-pay | Admitting: Internal Medicine

## 2010-05-17 LAB — CONVERTED CEMR LAB
INR: 2.74 — ABNORMAL HIGH (ref <1.50)
Prothrombin Time: 29.1 s — ABNORMAL HIGH (ref 11.6–15.2)

## 2010-06-10 ENCOUNTER — Encounter: Payer: Self-pay | Admitting: Internal Medicine

## 2010-06-10 ENCOUNTER — Other Ambulatory Visit: Payer: Self-pay | Admitting: Internal Medicine

## 2010-06-10 ENCOUNTER — Ambulatory Visit
Admission: RE | Admit: 2010-06-10 | Discharge: 2010-06-10 | Payer: Self-pay | Source: Home / Self Care | Attending: Internal Medicine | Admitting: Internal Medicine

## 2010-06-10 LAB — CBC WITH DIFFERENTIAL/PLATELET
Basophils Absolute: 0 10*3/uL (ref 0.0–0.1)
Basophils Relative: 0.1 % (ref 0.0–3.0)
Eosinophils Absolute: 0.2 10*3/uL (ref 0.0–0.7)
Eosinophils Relative: 3 % (ref 0.0–5.0)
HCT: 35.8 % — ABNORMAL LOW (ref 36.0–46.0)
Hemoglobin: 11.4 g/dL — ABNORMAL LOW (ref 12.0–15.0)
Lymphocytes Relative: 29.4 % (ref 12.0–46.0)
Lymphs Abs: 1.9 10*3/uL (ref 0.7–4.0)
MCHC: 31.9 g/dL (ref 30.0–36.0)
MCV: 77.7 fl — ABNORMAL LOW (ref 78.0–100.0)
Monocytes Absolute: 0.4 10*3/uL (ref 0.1–1.0)
Monocytes Relative: 6.6 % (ref 3.0–12.0)
Neutro Abs: 3.9 10*3/uL (ref 1.4–7.7)
Neutrophils Relative %: 60.9 % (ref 43.0–77.0)
Platelets: 215 10*3/uL (ref 150.0–400.0)
RBC: 4.61 Mil/uL (ref 3.87–5.11)
RDW: 15.4 % — ABNORMAL HIGH (ref 11.5–14.6)
WBC: 6.5 10*3/uL (ref 4.5–10.5)

## 2010-06-10 LAB — IRON: Iron: 187 ug/dL — ABNORMAL HIGH (ref 42–145)

## 2010-06-10 LAB — IBC PANEL
Iron: 187 ug/dL — ABNORMAL HIGH (ref 42–145)
Saturation Ratios: 38.4 % (ref 20.0–50.0)
Transferrin: 348 mg/dL (ref 212.0–360.0)

## 2010-06-10 LAB — VITAMIN B12: Vitamin B-12: 640 pg/mL (ref 211–911)

## 2010-06-10 LAB — FOLATE: Folate: 7.9 ng/mL

## 2010-06-10 LAB — PROTIME-INR
INR: 1.1 ratio — ABNORMAL HIGH (ref 0.8–1.0)
Prothrombin Time: 12.5 s — ABNORMAL HIGH (ref 10.2–12.4)

## 2010-06-10 LAB — FERRITIN: Ferritin: 6.8 ng/mL — ABNORMAL LOW (ref 10.0–291.0)

## 2010-06-11 LAB — CONVERTED CEMR LAB: CEA: 3.7 ng/mL (ref 0.0–5.0)

## 2010-06-13 ENCOUNTER — Telehealth (INDEPENDENT_AMBULATORY_CARE_PROVIDER_SITE_OTHER): Payer: Self-pay | Admitting: *Deleted

## 2010-06-14 ENCOUNTER — Other Ambulatory Visit: Payer: Self-pay | Admitting: Internal Medicine

## 2010-06-14 ENCOUNTER — Ambulatory Visit
Admission: RE | Admit: 2010-06-14 | Discharge: 2010-06-14 | Payer: Self-pay | Source: Home / Self Care | Attending: Internal Medicine | Admitting: Internal Medicine

## 2010-06-14 LAB — BUN: BUN: 10 mg/dL (ref 6–23)

## 2010-06-14 LAB — CREATININE, SERUM: Creatinine, Ser: 0.6 mg/dL (ref 0.4–1.2)

## 2010-06-17 ENCOUNTER — Telehealth (INDEPENDENT_AMBULATORY_CARE_PROVIDER_SITE_OTHER): Payer: Self-pay

## 2010-06-17 LAB — GLUCOSE, CAPILLARY
Glucose-Capillary: 137 mg/dL — ABNORMAL HIGH (ref 70–99)
Glucose-Capillary: 122 mg/dL — ABNORMAL HIGH (ref 70–99)

## 2010-06-18 ENCOUNTER — Encounter: Payer: Self-pay | Admitting: Internal Medicine

## 2010-06-19 ENCOUNTER — Ambulatory Visit: Payer: Self-pay | Admitting: Internal Medicine

## 2010-06-20 ENCOUNTER — Encounter: Payer: Self-pay | Admitting: Nurse Practitioner

## 2010-06-20 ENCOUNTER — Telehealth (INDEPENDENT_AMBULATORY_CARE_PROVIDER_SITE_OTHER): Payer: Self-pay | Admitting: *Deleted

## 2010-06-20 ENCOUNTER — Emergency Department (HOSPITAL_COMMUNITY)
Admission: EM | Admit: 2010-06-20 | Discharge: 2010-06-21 | Payer: Self-pay | Source: Home / Self Care | Admitting: Emergency Medicine

## 2010-06-20 ENCOUNTER — Ambulatory Visit
Admission: RE | Admit: 2010-06-20 | Discharge: 2010-06-20 | Payer: Self-pay | Source: Home / Self Care | Attending: Internal Medicine | Admitting: Internal Medicine

## 2010-06-20 ENCOUNTER — Telehealth: Payer: Self-pay | Admitting: Internal Medicine

## 2010-06-20 ENCOUNTER — Encounter (INDEPENDENT_AMBULATORY_CARE_PROVIDER_SITE_OTHER): Payer: Self-pay | Admitting: Internal Medicine

## 2010-06-20 DIAGNOSIS — T782XXA Anaphylactic shock, unspecified, initial encounter: Secondary | ICD-10-CM | POA: Insufficient documentation

## 2010-06-20 LAB — CONVERTED CEMR LAB: Blood Glucose, Fingerstick: 126

## 2010-06-23 ENCOUNTER — Encounter: Payer: Self-pay | Admitting: Family Medicine

## 2010-06-23 ENCOUNTER — Inpatient Hospital Stay (HOSPITAL_COMMUNITY)
Admission: EM | Admit: 2010-06-23 | Discharge: 2010-06-24 | Payer: Self-pay | Source: Home / Self Care | Attending: Internal Medicine | Admitting: Internal Medicine

## 2010-06-24 LAB — GLUCOSE, CAPILLARY

## 2010-06-25 LAB — COMPREHENSIVE METABOLIC PANEL
Albumin: 3.2 g/dL — ABNORMAL LOW (ref 3.5–5.2)
Alkaline Phosphatase: 68 U/L (ref 39–117)
CO2: 24 mEq/L (ref 19–32)
Chloride: 105 mEq/L (ref 96–112)
Creatinine, Ser: 0.69 mg/dL (ref 0.4–1.2)
GFR calc non Af Amer: >60 mL/min (ref >60)

## 2010-06-25 LAB — GLUCOSE, CAPILLARY
Glucose-Capillary: 330 mg/dL — ABNORMAL HIGH (ref 70–99)
Glucose-Capillary: 224 mg/dL — ABNORMAL HIGH (ref 70–99)
Glucose-Capillary: 240 mg/dL — ABNORMAL HIGH (ref 70–99)
Glucose-Capillary: 259 mg/dL — ABNORMAL HIGH (ref 70–99)
Glucose-Capillary: 312 mg/dL — ABNORMAL HIGH (ref 70–99)
Glucose-Capillary: 344 mg/dL — ABNORMAL HIGH (ref 70–99)

## 2010-06-25 LAB — BASIC METABOLIC PANEL
BUN: 18 mg/dL (ref 6–23)
Chloride: 111 mEq/L (ref 96–112)
Potassium: 4.1 mEq/L (ref 3.5–5.1)
Sodium: 142 mEq/L (ref 135–145)

## 2010-06-25 LAB — CBC
HCT: 37.5 % (ref 36.0–46.0)
Hemoglobin: 12.2 g/dL (ref 12.0–15.0)
Hemoglobin: 10.5 g/dL — ABNORMAL LOW (ref 12.0–15.0)
MCH: 24.2 pg — ABNORMAL LOW (ref 26.0–34.0)
MCH: 24.2 pg — ABNORMAL LOW (ref 26.0–34.0)
MCHC: 32.5 g/dL (ref 30.0–36.0)
MCHC: 32.5 g/dL (ref 30.0–36.0)
Platelets: 165 10*3/uL (ref 150–400)
Platelets: 177 10*3/uL (ref 150–400)
RBC: 5.05 MIL/uL (ref 3.87–5.11)
RBC: 4.33 MIL/uL (ref 3.87–5.11)
RDW: 15.1 % (ref 11.5–15.5)
RDW: 14.7 % (ref 11.5–15.5)

## 2010-06-25 LAB — DIFFERENTIAL
Lymphocytes Relative: 7 % — ABNORMAL LOW (ref 12–46)
Monocytes Relative: 5 % (ref 3–12)
Neutrophils Relative %: 85 % — ABNORMAL HIGH (ref 43–77)

## 2010-06-25 LAB — SEDIMENTATION RATE: Sed Rate: 19 mm/hr (ref 0–22)

## 2010-06-25 LAB — PROTIME-INR: Prothrombin Time: 21.9 seconds — ABNORMAL HIGH (ref 11.6–15.2)

## 2010-06-28 ENCOUNTER — Ambulatory Visit: Admit: 2010-06-28 | Payer: Self-pay | Admitting: Internal Medicine

## 2010-07-01 NOTE — Discharge Summary (Signed)
NAME:  [name], DRUM NO.:  [id number]  MEDICAL RECORD NO.:  [id number]          PATIENT TYPE:  INP  LOCATION:  1310                         FACILITY:  [facility]  PHYSICIAN:  Isidor Holts, M.D.  DATE OF BIRTH:  29-Apr-1956  DATE OF ADMISSION:  06/23/2010 DATE OF DISCHARGE:  06/24/2010                              DISCHARGE SUMMARY   PRIMARY PHYSICIAN:  Marcene Duos, M.D.  PRIMARY ONCOLOGIST:  Valentino Hue. Magrinat, M.D.  DISCHARGE DIAGNOSES: 1. Allergic rash, secondary to intravenous contrast medium. 2. History of cerebrovascular disease, status post cerebrovascular     accident with left hemiparesis, in 2003. 3. Chronic anticoagulation. 4. Type 2 diabetes mellitus. 5. Dyslipidemia. 6. History of colon Lawrence, status post resection and chemotherapy. 7. Status post cholecystectomy.  DISCHARGE MEDICATIONS: 1. Hydroxyzine 25 mg p.o. p.r.n. t.i.d. for itching for 2 weeks. 2. Pepcid 20 mg p.o. q.12 h. for 7 days only. 3. Prednisone taper as follows:  20 mg p.o. t.i.d. for 5 days, then 25     mg p.o. daily for 2 days, then 30 mg p.o. daily for 2 days, then 25     mg p.o. daily for 2 days, then 20 mg p.o. daily for 2 days, then 15     mg p.o. daily for 2 days, then 10 mg p.o. daily for 2 days, then 5     mg p.o. daily for 2 days, and then stop. 4. Triamcinolone lotion 0.1% with menthol 1% applied topically t.i.d.     for 7 days. 5. Aspirin 81 mg p.o. daily. 6. Coumadin 5 mg p.o. daily. 7. Crestor 10 mg p.o. daily. 8. Iron 65 mg 1 capsule p.o. b.i.d. 9. Metformin 1000 mg p.o. b.i.d.Marland Kitchen 10.NovoLog insulin 20 units subcutaneously q.a.m. and 10 units     subcutaneously nightly. 11.Vitamin B12 OTC 1 tablet p.o. daily.  PROCEDURES:  None.  CONSULTATIONS:  Lissa Merlin, M.D., dermatologist.  ADMISSION HISTORY:  As in H and P notes of June 23, 2010, dictated by Dr. Della Lawrence. However, in brief, this is a 55 year old native [place] female,  with known history of previous CVA with left hemiparesis, 2003; chronic anticoagulation; type 2 diabetes mellitus; dyslipidemia; history of colon Lawrence, status post colon resection and chemotherapy, follows up with Dr. Ruthann Lawrence, oncologist; status post cholecystectomy, presenting with a rash affecting both lower extremities, trunk, and chest.  The patient reportedly underwent abdominal/pelvic CT scan on June 19, 2010, which showed no evidence of recurrent disease.  Later in the same evening, she developed a rash on the lower extremities, which then began extending, was raised, macropapular, and had spread to her trunk, chest and  up to her neck. She then presented to the emergency department and was subsequently admitted for further evaluation, investigation, and management.  CLINICAL COURSE: 1. Allergic rash.  The patient was started on intravenous steroids,     H2RA, and antihistamines.  Clinical response was satisfactory.     Dermatology consultation was kindly provided by Dr. Dorinda Hill     of [facility], who confirmed that this was not a     vasculitis and recommended oral prednisone taper as well as  a     topical combination of triamcinolone and menthol. By June 24, 2010, the rash had significantly subsided.  The patient had no     further pruritus and was very keen to be discharged.  2. Chronic anticoagulation.  This was managed by clinical     pharmacologist during the course of this hospitalization.  3. Type 2 diabetes mellitus.  This was understandably exacerbated by     steroid treatment, however, was managed with sliding scale insulin     coverage, as well as appropriate diet.  4. Dyslipidemia.  The patient continues on preadmission dose of     statin.  5. History of colon Lawrence.  The patient showed no evidence of disease     recurrence on abdominal/pelvic CT scan of June 19, 2010, and     will continue to follow up with her  primary oncologist.  DISPOSITION:  The patient was on June 24, 2010, sufficiently clinically recovered to be discharged and was therefore discharged accordingly.  ACTIVITY:  No restrictions.  DIET:  Heart healthy/carbohydrate modified.  FOLLOWUP INSTRUCTIONS:  The patient is to follow up with her primary physician, Dr. Julieanne Lawrence at [facility], telephone number [phone], an appointment has been scheduled for June 28, 2010, at 2:30 p.m.     Isidor Holts, M.D.     CO/MEDQ  D:  06/24/2010  T:  06/25/2010  Job:  [id number]  cc:   Marcene Duos, M.D.  Electronically Signed by Isidor Holts M.D. on 07/01/2010 14:78:29 PM

## 2010-07-02 NOTE — Consult Note (Signed)
Summary: Colon Cancer                    [place]  Patient:    [name], [name] Visit Number: [id number] MRN: [id number]          Service Type: MED Location: [id number] Attending Physician:  Sanjuana Letters Dictated by:   Lorette Ang, N.P. Proc. Date: 05/04/01 Admit Date:  04/21/2001   CC:         Adolph Pollack, M.D.  Hedwig Morton. Juanda Chance, M.D. [facility]   Consultation Report  DATE OF BIRTH: 1956/02/19  REASON FOR CONSULTATION: Newly diagnosed colon cancer.  REFERRING PHYSICIAN: Emelda Fear, M.D.  HISTORY OF PRESENT ILLNESS: [name] is a 55 year old woman, with a history of CVA (September 2001, February 2002), patent foramen ovale, and diabetes mellitus, who was admitted on April 21, 2001 with painless rectal bleeding. She underwent a colonoscopy on April 26, 2001 by Dr. Lina Sar, with findings of two ulcerated colon lesions, one at 25 cm and the other at 35 cm. Pathology [phone]) favored active Crohns disease with no dysplasia identified.  A repeat flexible sigmoidoscopy was done on April 28, 2001 with repeat biopsy of the ulcer at 35 cm, with pathology ([id number]) confirming invasive colonic adenocarcinoma.  The patient was evaluated by surgery and underwent a sigmoid colectomy by Dr. Avel Peace on April 30, 2001.  The final pathology report is pending.  Abdominal/pelvic CTs were without significant abnormalities.  An oncology consultation was requested for further evaluation of this patient with newly diagnosed colon cancer.  PAST MEDICAL HISTORY:  1. CVA (September 2001, February 2002).  2. Patent foramen ovale.  3. Diabetes mellitus, type 2.  4. Status post cholecystectomy in 1982.  CURRENT MEDICATIONS:  1. Altace 2.5 mg q.d.  2. Folic acid 1 mg q.d.  3. Procrit 40,000 units weekly.  4. Ferrous sulfate 325 mg t.i.d.  5. Guaifenesin 1200 mg b.i.d.  6. Pepcid 20 mg IV q.12h.  7. Heparin  drip.  8. Sliding-scale insulin.  ALLERGIES: IBUPROFEN.  FAMILY HISTORY: Mother deceased at age 71 with colon cancer.  Father deceased at age 37 with cirrhosis.  Sister, age 35, is healthy.  A brother, age 17, is healthy.  SOCIAL HISTORY: [name]  She is separated.  She has four sons (ages 24, 44, 11, and nine), all reported to be healthy.  She is a [place] and is originally from [place].  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  HEALTH MAINTENANCE: Primary care Ashutosh Dieguez is Dr. Janey Greaser at the outpatient clinic.  Her emergency contact person is her sister, [name] (phone number [phone]).  The patient has never had a mammogram.  She thinks that her last Pap smear/pelvic was approximately one year ago.  She reports that her cholesterol is normal.  She does not receive the flu vaccine or the Pneumovax. She denies any history of EtOH or tobacco use.  She does not have a Living Will or health care power of attorney.  REVIEW OF SYSTEMS: The patient reports weight loss over the past year.  She recently has been experiencing some anorexia.  She denies any fever or sweats. She denies any pain.  She has had no unusual headaches of vision changes.  She denies any mouth sores or throat pain.  She has had no shortness of breath or cough.  She denies any chest pain.  She has had no lower extremity edema.  She denies any change in her bowel habits.  She does report rectal bleeding over the past year, which worsened the day of admission.  She denies any melena. She has had no abdominal pain.  She denies any hematuria or dysuria.  She has had no falls or balance problems.  She does report some left ankle weakness since her second stroke.  She reports that she has had a rash since being admitted.  PHYSICAL EXAMINATION:  VITAL SIGNS: Temperature 99.4 degrees, heart rate 101, respirations 18, blood pressure  95/59.  Oxygen saturation 90% on room air.  GENERAL: Pleasant, Native American woman in no acute distress.  HEENT: Normocephalic, atraumatic.  PERRL.  EOMI.  Sclerae anicteric.  Tongue dry.  Mouth otherwise clear.  LYMPHATICS: No palpable cervical, supraclavicular, axillary, or inguinal lymph nodes.  CHEST: Rhonchi throughout lung fields bilaterally.  CARDIOVASCULAR: Regular rate and rhythm.  ABDOMEN: Midline incision, intact with staples.  EXTREMITIES: No clubbing, cyanosis, or edema.  NEUROLOGIC: Alert and oriented x 3.  Follows commands.  Slight weakness on left plantar flexion.  LABORATORY DATA: Hemoglobin 9.2, WBC 7.5, platelets 321,000; MCV 62.7.  Sodium 134, potassium 3.6, BUN less than 2, creatinine 0.6, glucose 169.  Calcium 7.8.  Ferritin 22, iron 12, TIBC 306, per cent saturation 4; B-12 208. Preoperative CEA 2.7.  Admission hemoglobin 10.5, WBC 7.9, platelets 339,000; MCV 63.  Sodium 139, potassium 3.3, BUN 14, creatinine 0.7, glucose 177.  Total bilirubin 0.6, alkaline phosphatase 79.  SGOT 19, SGPT 12.  Total protein 2.9, calcium 8.5.  RADIOLOGY:  1. CT of the abdomen, normal.  2. CT of the pelvis, no significant abnormalities.  Mildly prominent     uterine contour.  IMPRESSION/PLAN: [name] is a 55 year old [place] woman, admitted with painless hematochezia, found to have sigmoid colon lesions, with biopsy confirming invasive adenocarcinoma.  She is status post sigmoid colectomy on April 30, 2001, with final pathology pending.  She does have a positive family history of colon cancer.  CT of the abdomen and pelvis were negative for evidence of metastatic disease.  Preoperative CEA 3.1.  The patient seems to have a poor understanding of the current situation.  Dr. Darnelle Catalan will meet with the patient and her family on Thursday morning to  discuss the diagnosis, prognosis, and any needed treatment.  Unless she is a B1, she will need  adjuvant chemotherapy and will benefit from port placement.  If she has not had a chest x-ray in the past two months, would also recommend obtaining this.  The patients brother and sister need to be made aware of the need for colonoscopy.  The patient was seen and examined by Dr. Darnelle Catalan. Dictated by:   Lorette Ang, N.P. Attending Physician:  Sanjuana Letters DD:  05/05/01 TD:  05/05/01 Job: [id number]

## 2010-07-02 NOTE — Letter (Signed)
[facility]   Imported By: Arta Bruce 01/17/2010 10:06:30  _____________________________________________________________________  External Attachment:    Type:   Image     Comment:   External Document

## 2010-07-02 NOTE — Assessment & Plan Note (Signed)
Summary: CPP / NS   Vital Signs:  Patient profile:   55 year old female Menstrual status:  hysterectomy Height:      63.5 inches Weight:      170.8 pounds Temp:     96.6 degrees F oral Pulse rate:   84 / minute Pulse rhythm:   regular Resp:     20 per minute BP sitting:   120 / 70  (left arm) Cuff size:   large  Vitals Entered By: [place] Shannon (April 02, 2010 10:59 AM)  History of Present Illness: 55 yo female here for CPP.  Concerns:  1.  Anemia with history of colon cancer:  Have been unable to get pt. in for colonoscopy.  Today, she states she has not even read the letter sent out from Dr. Regino Schultze office on 02/13/10.  Pt. now states she has had some blood in her stool x2 in past month.  Pt. has also lost weight, but has really been working on exercise and eating less.  No abdominal pain.  Stools are formed.  No melena.  Does have dark stools with her iron supplement.  Pt. is also taking Coumadin.  Pt. now states she may be able to get a free colonoscopy at [facility] reservation.  2.  DM:  Pt. not taking any Novolog mix generally  in morning and 30 units at night after eating.  Takes about 20 units in morning twice weekly.  Taking 25-30 units every night--but again, after eating. Sugars in morning ranging 90-130.  Before dinner, running mid 100s.  Only once this month has her sugar been to 180. Pt. stopped using morning insulin as she would drop too low during day.  Often skips breakfast.  Allergies: 1)  ! Ibuprofen (Ibuprofen) 2)  Sulfamethoxazole (Sulfamethoxazole) 3)  Avandamet (Rosiglitazone-Metformin) 4)  Glucotrol (Glipizide) 5)  Morphine 6)  Vicodin (Hydrocodone-Acetaminophen)  Family History: Mother, died 61:  colon CA,  DM Father, died 55:  pulmonary TB--did not seek treatment, CAD, hypertension. 8 brothers and 3 sisters:  Older sister with DM. 4 sons:  44, 36, 51, 31:  all healthy  Social History: Originally from [place] Changing last name  back to [employer]. Recently divorced. 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   Tobacco Use:  none Alcohol use: none Drug use:  none  Review of Systems General:  Energy is generally fine.  Around 2 p.m., gets very tired--wants to sleep.  Not sure if snores.  Feels well rested when awakens in morning.. Eyes:  Glasses--last eyecheck was [place], thinks was last year.. ENT:  Denies decreased hearing; rare tinnitus in left ear.. CV:  Denies chest pain or discomfort and palpitations. Resp:  Denies shortness of breath. GI:  Complains of bloody stools and gas; denies abdominal pain and dark tarry stools; Rare constipation and diarrhea. GU:  Denies discharge and dysuria. MS:  Denies joint pain, joint redness, and joint swelling. Derm:  Denies lesion(s) and rash; IV port for chemotherapy still in--has been unable to afford to get out.Marland Kitchen Neuro:  Denies weakness; Numbness and tinling in right great toe.Marland Kitchen Psych:  Denies anxiety, depression, and suicidal thoughts/plans.  Physical Exam  General:  Well-developed,well-nourished,in no acute distress; alert,appropriate and cooperative throughout examination Head:  Normocephalic and atraumatic without obvious abnormalities. No apparent alopecia or balding. Eyes:  No corneal or conjunctival inflammation noted. EOMI. Perrla. Funduscopic  exam benign, without hemorrhages, exudates or papilledema. Vision grossly normal. Ears:  External ear exam shows no significant lesions or deformities.  Otoscopic examination reveals clear canals, tympanic membranes are intact bilaterally without bulging, retraction, inflammation or discharge. Hearing is grossly normal bilaterally. Nose:  External nasal examination shows no deformity or inflammation. Nasal mucosa are pink and moist without lesions or exudates. Mouth:  Oral mucosa and oropharynx without  lesions or exudates.  Teeth in good repair. Neck:  No deformities, masses, or tenderness noted. Breasts:  No mass, nodules, thickening, tenderness, bulging, retraction, inflamation, nipple discharge or skin changes noted.   Lungs:  Normal respiratory effort, chest expands symmetrically. Lungs are clear to auscultation, no crackles or wheezes. Heart:  Normal rate and regular rhythm. S1 and S2 normal without gallop, murmur, click, rub or other extra sounds. Abdomen:  Bowel sounds positive,abdomen soft and non-tender without masses, organomegaly or hernias noted.  Well healed oblique right upper quadrant scar. Rectal:  No external abnormalities noted. Normal sphincter tone. No rectal masses or tenderness.  Heme positive brown stool Genitalia:  Pelvic Exam:        External: normal female genitalia without lesions or masses        Vagina: normal without lesions or masses        Cervix: absent        Adnexa: normal bimanual exam without masses or fullness        Uterus: absent        Pap smear: not performed Msk:  No deformity or scoliosis noted of thoracic or lumbar spine.   Pulses:  R and L carotid,radial,femoral,dorsalis pedis and posterior tibial pulses are full and equal bilaterally Extremities:  No clubbing, cyanosis, edema, or deformity noted with normal full range of motion of all joints.   Neurologic:  No cranial nerve deficits noted. Station and gait are normal. Plantar reflexes are down-going bilaterally. DTRs are symmetrical throughout. Sensory, motor and coordinative functions appear intact. Skin:  tattoo left lateral calf Cervical Nodes:  No lymphadenopathy noted Axillary Nodes:  No palpable lymphadenopathy Inguinal Nodes:  No significant adenopathy Psych:  Cognition and judgment appear intact. Alert and cooperative with normal attention span and concentration. No apparent delusions, illusions, hallucinations   Impression & Recommendations:  Problem # 1:  ROUTINE GYNECOLOGICAL  EXAMINATION (ICD-V72.31)  Mammogram scheduled  Orders: UA Dipstick w/o Micro (manual) (82956) T-Chlamydia  Probe, urine ([id number]) T-GC Probe, urine [id number]) T-HIV Antibody  (Reflex) [id number]) T-Syphilis Test (RPR) (53664-40347)  Problem # 2:  ANEMIA (ICD-285.9)  New onset--end of September. Iron studies nondiagnostic, but with 2 episodes of hematochezia in past month and heme positive stool today, and the fact that pt. has not followed up with GI and Oncology for some time, .very concerned that she has a recurrence. She has not replied to Dr. Regino Schultze office letter that was sent out in September. We are currently speaking with Dr Regino Schultze office to try and get her in there ASAP--she will need to hold Coumadin prior to colonoscopy Her updated medication list for this problem includes:    Ferrous Sulfate 325 (65 Fe) Mg Tabs (Ferrous sulfate) .Marland Kitchen... Take 1 tablet by mouth twice a  day    Vitamin B-12 Cr 1000 Mcg Cr-tabs (Cyanocobalamin) .Marland Kitchen... 1 tab by mouth daily  Orders: T-CBC w/Diff ([id number])  Complete Medication List: 1)  Adult Aspirin Low Strength 81 Mg Chew (Aspirin) .... Take 1 tablet by mouth once a  day 2)  Ferrous Sulfate 325 (65 Fe) Mg  Tabs (Ferrous sulfate) .... Take 1 tablet by mouth twice a  day 3)  Metformin Hcl 1000 Mg Tabs (Metformin hcl) .... Two times a day 4)  Warfarin Sodium 5 Mg Tabs (Warfarin sodium) .Marland Kitchen.. 1 tab by mouth daily 5)  Novolog Mix 70/30 Flexpen 70-30 % Susp (Insulin aspart prot & aspart) .Marland Kitchen.. 10 units subcutaneously before breakfast and 15 units subcutaneously before dinner. 6)  Crestor 10 Mg Tabs (Rosuvastatin calcium) .Marland Kitchen.. 1 tab by mouth daily with evening meal 7)  Vitamin B-12 Cr 1000 Mcg Cr-tabs (Cyanocobalamin) .Marland Kitchen.. 1 tab by mouth daily 8)  Glucose Meter Test Strips  .... Two times a day testing 9)  Permethrin 5 % Crea (Permethrin) .... After showering or bathing, apply from base of neck all over body to the tips of your toes and  leave on for 8-14 hours and then wash off.  may repeat in 1 week if needed.  Other Orders: T-Urine Microalbumin w/creat. ratio [phone]) T- Hemoglobin A1C (19147-82956) T-Protime, Auto ([id number]) Flu Vaccine 37yrs + (69629) Admin 1st Vaccine (52841)  Patient Instructions: 1)  Lab appt. for protime in 1 month 2)  Follow up with Dr. Delrae Alfred in 4 months  Prescriptions: WARFARIN SODIUM 5 MG TABS (WARFARIN SODIUM) 1 tab by mouth daily  #30 x 6   Entered and Authorized by:   [name] MD   Signed by:   [name] MD on 04/10/2010   Method used:   Faxed to ...       [facility] (retail)       [address] [place], [place]  [address]       Ph: [phone]       Fax: [phone]   RxID:   [id number] GLUCOSE METER TEST STRIPS two times a day testing  #100 x 11   Entered and Authorized by:   [name] MD   Signed by:   [name] MD on 04/10/2010   Method used:   Faxed to ...       [facility] - [facility] (retail)       [address] [place], [place]  [address]       Ph: [phone]       Fax: [phone]   RxID:   [id number] VITAMIN B-12 CR 1000 MCG CR-TABS (CYANOCOBALAMIN) 1 tab by mouth daily  #30 x 11   Entered and Authorized by:   [name] MD   Signed by:   [name] MD on 04/10/2010   Method used:   Faxed to ...       [facility] - [facility] (retail)       [address] [place], [place]  [address]       Ph: [phone]       Fax: [phone]   RxID:   [id number] CRESTOR 10 MG TABS (ROSUVASTATIN CALCIUM) 1 tab by mouth daily with evening meal  #30 x 11   Entered and Authorized by:   [name] MD   Signed by:   [name] MD on 04/10/2010   Method used:   Faxed to ...       [facility] - [facility] (retail)       [address] [place], [place]  [address]       Ph: [phone]  Fax: [phone]   RxID:   [id number] METFORMIN HCL 1000 MG  TABS (METFORMIN HCL) two times a day  #60 x 11   Entered and Authorized by:   [name] MD   Signed by:   [name] MD on 04/10/2010   Method used:   Faxed to ...       [facility] - [facility] (retail)       [address] [place], [place]  [address]       Ph: [phone]       Fax: [phone]   RxID:   [id number] FERROUS SULFATE 325 (65 FE) MG TABS (FERROUS SULFATE) Take 1 tablet by mouth twice a  day  #60 x 3   Entered and Authorized by:   [name] MD   Signed by:   [name] MD on 04/10/2010   Method used:   Faxed to ...       [facility] - [facility] (retail)       [address] [place], [place]  [address]       Ph: [phone]       Fax: [phone]   RxID:   [id number] NOVOLOG MIX 70/30 FLEXPEN 70-30 % SUSP (INSULIN ASPART PROT & ASPART) 10 units subcutaneously before breakfast and 15 units subcutaneously before dinner.  #1 month x 11   Entered and Authorized by:   [name] MD   Signed by:   [name] MD on 04/02/2010   Method used:   Faxed to ...       [facility] - [facility] (retail)       [address] [place], [place]  [address]       Ph: [phone]       Fax: [phone]   RxID:   [id number]    Orders Added: 1)  Est. Patient age 74-64 [phone] 2)  UA Dipstick w/o Micro (manual) [81002] 3)  T-CBC w/Diff [18299-37169] 4)  T-Chlamydia  Probe, urine [id number] 5)  T-GC Probe, urine [id number] 6)  T-HIV Antibody  (Reflex) [[id number]] 7)  T-Syphilis Test (RPR) [43154-00867] 8)  T-Urine Microalbumin w/creat. ratio [[id number]] 9)  T- Hemoglobin A1C [83036-23375] 10)  T-Protime, Auto [61950-93267] 11)  Flu Vaccine 37yrs + [90658] 12)  Admin 1st Vaccine  [12458]   Immunizations Administered:  Influenza Vaccine # 1:    Vaccine Type: Fluvax 3+    Site: left deltoid    Mfr: GlaxoSmithKline    Dose: 0.5 ml    Route: IM    Given by: Hale Drone    Exp. Date: 11/30/2010    Lot #: [id number]    VIS given: 12/25/09 version given April 02, 2010.  Flu Vaccine Consent Questions:    Do you have a history of severe allergic reactions to this vaccine? no    Any prior history of allergic reactions to egg and/or gelatin? no    Do you have a sensitivity to the preservative Thimersol? no    Do you have a past history of Guillan-Barre Syndrome? no    Do you currently have an acute febrile illness? no    Have you ever had a severe reaction to latex? no    Vaccine information given and explained to patient? yes    Are you currently pregnant? no   Immunizations Administered:  Influenza Vaccine # 1:  Vaccine Type: Fluvax 3+    Site: left deltoid    Mfr: GlaxoSmithKline    Dose: 0.5 ml    Route: IM    Given by: Hale Drone    Exp. Date: 11/30/2010    Lot #: [id number]    VIS given: 12/25/09 version given April 02, 2010.   Preventive Care Screening  Prior Values:    Mammogram:  ASSESSMENT: Negative - BI-RADS 1^MM DIGITAL SCREENING (01/30/2009)    Colonoscopy:  2 colon lesions at 25 and 35 cm, ulcerated, rule out neoplasm--Dr. Juanda Chance (04/26/2001)    Last Tetanus Booster:  Tdap (07/13/2008)    Last Flu Shot:  Fluvax 3+ (06/19/2009)    Last Pneumovax:  Pneumovax (07/13/2008)     Status post vaginal hysterectomy.  Ovaries left.  No hot flashes or night sweats. Osteoprevention:  Not much in way of dairy intake daily.  Does eat 1 serving daily.  Exercising a lot currently--walks in mall --4 big laps daily. Colonoscopy:  has not had a colonoscopy since her colon cancer diagnosis in 2002.  Have not been able to get her to go.  Laboratory Results   Urine Tests    Routine Urinalysis   Glucose: negative   (Normal Range:  Negative) Bilirubin: negative   (Normal Range: Negative) Ketone: negative   (Normal Range: Negative) Spec. Gravity: >=1.030   (Normal Range: 1.003-1.035) Blood: negative   (Normal Range: Negative) pH: 5.0   (Normal Range: 5.0-8.0) Protein: negative   (Normal Range: Negative) Urobilinogen: 0.2   (Normal Range: 0-1) Nitrite: negative   (Normal Range: Negative) Leukocyte Esterace: trace   (Normal Range: Negative)    Date/Time Received: April 02, 2010 1:19 PM   Other Tests  Rapid HIV: negative

## 2010-07-02 NOTE — Letter (Signed)
Summary: Office Visit Letter  [facility]  [address] [place], [place] [address]   Phone: [phone]  Fax: [phone]      February 13, 2010 MRN: [id number]   [name] [address] [place], [place]  [address]   Dear [name],   According to our records, it is time for you to schedule a follow-up office visit with [place] in November.   At your convenience, please call [phone] (option #2)to schedule an office visit. If you have any questions, concerns, or feel that this letter is in error, we would appreciate your call.   Sincerely,  Hedwig Morton. Juanda Chance, M.D.  [facility] [phone]

## 2010-07-02 NOTE — Progress Notes (Signed)
Summary: GI (DR BRODIE )   Phone Note Outgoing Call   Summary of Call: Tiffany--could you call Dr. Regino Schultze office (GI) and ask for pt's last colonscopy--was it prior to her colon cancer surgery?  I am unable to find any follow up in the [facility] system and did not receive any info from releases last winter. Also, please see if pt. went for surgical referral for broviac removal. Also, she needs a monthly protime. Initial call taken by: [name] MD,  December 08, 2009 3:21 PM  Follow-up for Phone Call        Dr. Delia Chimes office to fax over colonoscopy from 2002.  Left msg with her nurse to find out if before or after colon cancer surgery and left msg on pt's voicemail to call with regards to other msg. Follow-up by: Vesta Mixer CMA,  December 19, 2009 11:40 AM  Additional Follow-up for Phone Call Additional follow up Details #1::        Call back from Dr. Delia Chimes office.  They have only done one colonoscopy on pt and that was on Nov. 20, 2002.  They did the resection of her colon on the same day.  They have not seen her since. Additional Follow-up by: Vesta Mixer CMA,  December 19, 2009 4:22 PM    Additional Follow-up for Phone Call Additional follow up Details #2::    Spoke with pt.--she did not go for surgical removal of indwelling venous catheter as she states she could not afford the charges. Encouraged her to get in for a protime and OV  Nora--could you set her back up with Dr. Juanda Chance for surveillance colonoscopy?  Order for referral written.  Also, Tiffany, could you call Dr. Darrall Dears office and see when she was last seen there and if they recommend she still be followed by them  [name] MD  December 29, 2009 4:34 PM   Additional Follow-up for Phone Call Additional follow up Details #3:: Details for Additional Follow-up Action Taken: [facility] [phone] they were unable to find a listing for [employer]. I contacted the pt. she states she last saw Dr. Darnelle Catalan  2003-04. She was to follow-up but had gotten busy & did not go back. 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 Called [facility] again &  left message on answer machine for them to check last name Awolowo & call us back. Gaylyn Cheers RN  January 01, 2010 10:14 AM  Call back from Dr. Darrall Dears office, they have purged pt from there system for multiple no shows.  If you feel like she still needs to be seen by them then she can return as a new pt.  They will fax over her old records.......... Elmarie Shiley McCoy CMA  January 02, 2010 10:03 AM   Saw records--let's just start with getting her back to Dr. Rubye Oaks, has that been initiated?  [name] MD  January 17, 2010 4:17 AM   I call [facility]  Dr Patterson Hammersmith office they said that she is out of town and the next available appt is until November but they don't have the schedule . They will call the pt for a F/U and Iwill call in the middle of October for an appt as well.Cheryll Dessert  January 17, 2010 9:50 AM

## 2010-07-02 NOTE — Progress Notes (Signed)
Summary: HAS UNEMPLOYMENT HEARING 10/12  Phone Note Call from Patient   Summary of Call: pt is requesting a phone call. pt does not wish to schedule an appt at this time as she does not have the co pay. 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 Follow-up by: Leodis Rains,  March 06, 2010 12:13 PM  Additional Follow-up for Phone Call Additional follow up Details #1::        PT IS CALLING AGAIN TO TRY TO GET THAT LETTER FOR UNEMPLOYMENT SHE SAID IT IS VERY IMPORTANT THAT SHE GETS IT FOR COURT AND IT NEEDS TO STATE WHAT HER CONDITION IS IS, HOW LONG SHE HAS HAD THE CONDITION, WHAT ARE THE SYMPTOMS, WHAT ARE THE CAUSES-SLEEPY TIRED ETC,MEDICATION GIVEN, AND PREFESSIONAL OPINION Additional Follow-up by: Oscar La,  March 08, 2010 11:35 AM    Additional Follow-up for Phone Call Additional follow up Details #2::    forward to Jolisa Intriago... [place] Shannon  March 08, 2010 1:49 PM  Letter written.   PLease print off from the office today.  She may pick up.  Her anemia is mild to moderate.  I am more concerned with why she has it rather than the level of its severity at this point--with her history of colon cancer and we really need to get her back to have another colonoscopy ASAP--her gastroenterologist is waiting to hear from her.  [name] MD  March 11, 2010 9:43 AM   Please send this on to [facility] subsequently--I am trying to get her back to her GI physician for a colonoscopy--Dr. Lina Sar.  Arna Medici, could you please see  what the charge would be for her to undergo a colonoscopy with Dr. Juanda Chance?  If it is something pt. cannot afford, can you see if Dr. Corinda Gubler can fit her in his schedule?  They need to know she will need to come off Coumadin for a time before the procedure.  [name] MD  March 11, 2010 9:58 AM   Follow-up by: [name] MD,  March 11, 2010 9:58 AM  Additional Follow-up for Phone Call Additional follow up Details #3:: Details for Additional Follow-up Action Taken: Left message on answering machine for pt to call back.Marland KitchenMarland KitchenMarland KitchenArmenia Shannon  March 11, 2010 3:18 PM spoke with pt and she is aware of letter...pt is aware of scheduling for GI just need the appt date and time.... [place] Shannon  March 11, 2010 3:34 PM  I callDr Lina Sar on 01-17-10 @ 9;45am she is out of town and her next available schedule is in November but her schedule is not set up jet. I call 03-13-10 @ 9:27am and they told me to call back in a couple of weeks to shedule her an appt .Marland KitchenCheryll Dessert  March 13, 2010 1:20 PM

## 2010-07-02 NOTE — Discharge Summary (Signed)
Summary: Colon Adenocarcinoma                    [place]. [facility]  Patient:    [name], [name] Visit Number: [id number] MRN: [id number]          Service Type: MED Location: [id number] Attending Physician:  Sanjuana Letters Dictated by:   Emelda Fear, M.D. Admit Date:  04/21/2001 Discharge Date: 05/09/2001   CC:         Lyndee Leo. Janey Greaser, M.D., [facility]             Adolph Pollack, M.D., general surgery             Valentino Hue. Magrinat, M.D., hematology/oncology             Hedwig Morton. Juanda Chance, M.D. [facility], gastroenterology                           Discharge Summary  DATE OF BIRTH:  07-21-1955.  CONSULTS: 1. Gastroenterology. 2. General surgery. 3. Hematology/oncology.  PROCEDURES: 1. Colonoscopy x2. 2. Sigmoid colectomy with end colostomy. 3. Port-A-Cath placement.  DISCHARGE DIAGNOSES: 1. 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 2. Anemia secondary to acute blood loss and iron deficiency. 3. Diabetes mellitus type 2. 4. Patent foramen ovale. 5. History of cerebrovascular accidents x2 requiring lifelong Coumadin    therapy. 6. Postoperative pneumonia versus atelectasis. 7. Allergic reaction with pyritic maculopapular rash secondary to morphine    therapy. 8. Diarrhea. 9. Status post Port-A-Cath placement.  DISCHARGE MEDICATIONS:  1. Iron sulfate 325 mg p.o. t.i.d.  2. Colace 200 mg p.o. b.i.d.  3. Folvite one p.o. q.d.  4. Altace 2.5 mg p.o. q.d.  5. Coumadin 5 mg p.o. q.d.  6. Lovenox 80 mg subcu b.i.d. x2 days.  7. Phenergan 12.5 mg p.o. q.6h. p.r.n. nausea.  8. Glucophage 1000 mg p.o. b.i.d.  9. Aspirin 81 mg p.o. q.d. 10. Celebrex 100 mg p.o. b.i.d.  HISTORY OF PRESENT ILLNESS:  [name] is a 55 year old Native American woman presenting secondary to bright red blood per rectum.  Patient with a history of colonic polyps that were not followed up in the  past suspicious for colon cancer.  GI consult was obtained and the patient underwent colonoscopy which revealed mucosal colitis with granuloma tissue and fibrinous exudate with abscesses suggestive of Crohns disease.  Due to high suspicion of malignancy, gastroenterology performed a second colonoscopy for additional biopsies. Repeat biopsies were consistent with sigmoid colonic adenocarcinoma. Subsequently, general surgery was consulted for resection of malignancy in sigmoid colon.  The patient underwent sigmoid colectomy with end colostomy. With final diagnoses, a tissue resection, the patient was diagnosed with stage III adenocarcinoma.  Hematology/oncology consult was obtained secondary to malignancy.  Per recommendations of heme/onc, the patient underwent Port-A-Cath placement, chemotherapy initiation in the upcoming two weeks upon discharge.  The patient was discharged to home in stable condition.  The patient will follow up in two days for evaluation of PT and INR at the family practice center.  Chemotherapy will be initiated on May 19, 2001.  PROBLEM LIST:  #1 - STAGE III COLONIC ADENOCARCINOMA:  The patient is status post sigmoid colectomy with end colostomy. The patient underwent two colonoscopies as well as surgical resection of lesion.  The patient will initiate chemotherapy two weeks after discharge.  #2 -  ANEMIA SECONDARY TO ACUTE BLOOD LOSS AND IRON DEFICIENCY:  The patient presented with bright red blood per rectum.  The patient was Hemoccult positive on presentation.  Iron studies were performed during her admission which revealed iron deficiency.  The patient will be discharged on iron therapy for the upcoming three months. The patient did not require blood transfusions during her admission.  Discharge hemoglobin was 8.4.  #3 -  DIABETES MELLITUS TYPE 2:  The patient was discharged on glucophage therapy.  CBGs remained stable throughout her admission.  #4 -  PATENT FORAMEN OVALE:  Remained stable throughout admission.  The patient was continued on Coumadin therapy as appropriate.  #5 - HISTORY OF CEREBROVASCULAR ACCIDENTS X2:  The patient was continued on Coumadin therapy and aspirin as appropriate.  The patient is a lifelong Coumadin candidate secondary to patent foramen ovale as well as recurrent CVAs.  #6 - POSTOPERATIVE PNEUMONIA VERSUS ATELECTASIS:  On postoperative day #2, the patient started complaining of cough with sputum production. The patient spiked fever during admission.  The patient was initiated on antibiotic therapy of Maxipime for total of five days of therapy.  The patient was afebrile on discharge and was not discharged home on any antibiotic therapy.  #7 - ALLERGIC REACTION WITH MACULOPAPULAR RASH SECONDARY TO MORPHINE THERAPY: Recommended to the patient that she no longer take morphine or other narcotic therapy secondary to allergic reaction. The patient was in agreement with this.  #8 - DIARRHEA:  The patient developed diarrhea last two days prior to discharge.  Emphasized to the patient that diarrhea is common status post GI surgery.  C. diff. and studies were pending upon discharge. The patient recommended to use Imodium p.r.n. for diarrhea.  #9 - STATUS POST PORT-A-CATH PLACEMENT:  Chemotherapy will be initiated two weeks after discharge approximately May 19, 2001, by Valentino Hue. Magrinat, M.D.  DISCHARGE LABS:  WBC 6.4, hemoglobin 8.4, hematocrit 26.1, platelets 300.  PT 16.8, INR 1.5.  C. difficile pending.  Sodium 142, potassium 3.2, chloride 111, CO2 25, BUN 5, creatinine 0.7, glucose 157, calcium 7.9.  Folate 919. MCV low at 62. Total bilirubin 0.6, alkaline phosphatase 79, SGOT 19, SGPT 12, total protein 6.6, albumin 2.9.  Ferritin 22, total iron binding capacity 30%, percent saturation 4, vitamin B12 208.  Colonic polyp biopsies, invasive colonic adenocarcinoma.  Chest x-ray revealed right middle  lobe and right upper lobe infiltrate consistent with postoperative atelectasis versus pneumonia.   DISCHARGE INSTRUCTIONS: 1. Activity:  No heavy lifting for the next four to six weeks. 2. Diet:  Low salt, low sugar diet. 3. Wound care:  Keep wound clean and dry.  Do not allow water to run over    incision yet.  No washing. 4. Special instructions:  The patient is to call the family practice office    for development of fever, vomiting, or other concerns. 5. Follow-up:  The patient has an appointment at the [facility] on Monday, May 10, 2001, at 8:30 for blood work. 6. The patient has an appointment with [facility] on    Wednesday, May 12, 2001, at 2:10, with her primary care physician,    Lyndee Leo. Janey Greaser, M.D. Dictated by:   Emelda Fear, M.D. Attending Physician:  Sanjuana Letters DD:  06/18/01 TD:  06/21/01 Job: [id number]

## 2010-07-02 NOTE — Letter (Signed)
Summary: New Patient letter  [name]  [address] [place], [place] [address]   Phone: [phone]  Fax: [phone]       03/21/2010 MRN: [id number]  [name] [address] [place], [place]  [address]  Dear [name],  Welcome to the Gastroenterology Division at [facility].    You are scheduled to see Dr. Juanda Chance on 05/08/2010 at 10:30AM on the 3rd floor at [facility], [address]  We ask that you try to arrive at our office 15 minutes prior to your appointment time to allow for check-in.  We would like you to complete the enclosed self-administered evaluation form prior to your visit and bring it with you on the day of your appointment.  We will review it with you.  Also, please bring a complete list of all your medications or, if you prefer, bring the medication bottles and we will list them.  Please bring your insurance card so that we may make a copy of it.  If your insurance requires a referral to see a specialist, please bring your referral form from your primary care physician.  Co-payments are due at the time of your visit and may be paid by cash, check or credit card.     Your office visit will consist of a consult with your physician (includes a physical exam), any laboratory testing he/she may order, scheduling of any necessary diagnostic testing (e.g. x-ray, ultrasound, CT-scan), and scheduling of a procedure (e.g. Endoscopy, Colonoscopy) if required.  Please allow enough time on your schedule to allow for any/all of these possibilities.    If you cannot keep your appointment, please call [phone] to cancel or reschedule prior to your appointment date.  This allows [place] the opportunity to schedule an appointment for another patient in need of care.  If you do not cancel or reschedule by 5 p.m. the business day prior to your appointment date, you will be charged a $50.00 late cancellation/no-show fee.    Thank you  for choosing [facility] for your medical needs.  We appreciate the opportunity to care for you.  Please visit [place] at our website  to learn more about our practice.                     Sincerely,                                                             The Gastroenterology Division

## 2010-07-02 NOTE — Letter (Signed)
Summary: Letter to Ardine Bjork MD/Dora Juanda Chance MD  Letter to Ardine Bjork MD/Dora Juanda Chance MD   Imported By: Sherian Rein 04/09/2010 09:52:21  _____________________________________________________________________  External Attachment:    Type:   Image     Comment:   External Document

## 2010-07-02 NOTE — Op Note (Signed)
Summary: Sigmoid Colon Cancer                    [place]. [facility]  Patient:    [name], [name] Visit Number: [id number] MRN: [id number]          Service Type: MED Location: 5000 [id number] Attending Physician:  Sanjuana Letters Dictated by:   Adolph Pollack, M.D. Proc. Date: 04/30/01 Admit Date:  04/21/2001   CC:         Hedwig Morton. Juanda Chance, M.D. [facility]   Operative Report  PREOPERATIVE DIAGNOSIS:  Sigmoid colon cancer.  POSTOPERATIVE DIAGNOSIS:  Sigmoid colon cancer.  PROCEDURE:  Sigmoid colectomy.  SURGEON:  Adolph Pollack, M.D.  ASSISTANT:  Zigmund Daniel, M.D.  ANESTHESIA:  General.  INDICATION:  This is a 55 year old female who has been having intermittent rectal bleeding for many years but more severe here lately.  She is on chronic Coumadin for a patent foramen ovale and multiple pontine strokes.  She was noted to have lesions in her colon in the sigmoid region.  Initial biopsies were inconclusive; however, biopsy of one lesion at 37 cm was positive for cancer.  The entire lesion was unable to be removed.  She is currently off heparin and has undergone a bowel prep, and now she is brought to the operating room for elective colectomy.  DESCRIPTION OF PROCEDURE:  She was placed supine on the operating table, where a general anesthetic was administered.  The abdomen and perineum were sterilely prepped and draped after she was placed in the lithotomy position and a Foley catheter was inserted.  A lower midline incision was made, incising the skin and subcutaneous tissue down to the level of the fascia. The fascia was incised, as was the peritoneum.  The peritoneal cavity was entered under direct vision.  A Balfour retractor was used for visualization. Preoperatively she had had a CT scan, which did not demonstrate any liver lesions.  The sigmoid colon was quite redundant.  I was able to palpate the lesion and mark it with a suture.  The  lateral attachments of the sigmoid colon were incised with the cautery and the sigmoid colon was medialized.  I then divided the sigmoid colon approximately 10-15 proximal to the lesion.  I then divided the rectosigmoid junction, which was approximately 5-6 cm distal to the lesion.  The mesentery in between, including the inferior mesenteric artery, was divided and ligated.  I took the specimen to the back table, opened it up, and noted the lesion.  I then marked the distal end of the specimen with a suture.  I changed my gloves.  Next, because of some redundancy in some of the proximal sigmoid and descending colon, I was able to place the rectosigmoid junction in a side-to-side fashion with the distal descending colon, and a stapled anastomosis was performed.  I closed the remaining enterotomy with a linear noncutting stapler.  The anastomosis was patent, viable, and under no tension. I then reapproximated the mesentery to close the mesenteric defect with interrupted sutures.  All gloves were changed at this time.  The pelvis was irrigated.  No bleeding was noted.  The sponge count was correct at this time.  The omentum was placed back over the viscera and the fascia closed with a running #1 PDS suture.  The subcutaneous tissue was irrigated and the skin was closed with staples.  Perioperatively she has been covered with ampicillin and gentamicin.  She  will also have her heparin started tomorrow at 6 a.m.  She did tolerate the procedure well without any apparent complications and was taken to the recovery room in satisfactory condition. Dictated by:   Adolph Pollack, M.D. Attending Physician:  Sanjuana Letters DD:  04/30/01 TD:  04/30/01 Job: [id number]

## 2010-07-02 NOTE — Op Note (Signed)
Summary: COLON                     [place]. [facility]  Patient:    [name], [name] Visit Number: [id number] MRN: [id number]          Service Type: MED Location: [id number] Attending Physician:  Sanjuana Letters Dictated by:   Hedwig Morton. Juanda Chance, M.D. [facility] Admit Date:  04/21/2001   CC:         William A. Leveda Anna, M.D.   Procedure Report  NAME OF PROCEDURE:  Colonoscopy.  INDICATION:  This 55 year old native [place] female was admitted with rectal bleeding, anemia, hemoccult positive stools.  She has had intermittent painless bleeding for the past year.  She also has been anticoagulated long-term because of series of embolic CVAs secondary to a patent foramen ovale.  She was prepped for colonoscopy then was discontinued and heparin held five hours prior to the procedure.  ENDOSCOPE:  Fujinon single-channel videoscope.  SEDATION:  Versed 5 mg IV, Fentanyl 50 mcg IV.  FINDINGS:  Fujinon single-channel videoscope passed under direct vision through rectum to the sigmoid colon.  Patient was monitored by pulse oximeter. Her prep was excellent.  Anal canal was normal.  There were some hypertrophic papillae in the anal canal.  At the level of 25 cm from the rectum was a circular ulcerated area with raised edges, somewhat infiltrating, with a depressed center suggestive of malignancy.  It was nonobstructing.  Video photographs were obtained.  Also, multiple biopsies were obtained.  The lesion measured about 2-3 cm in diameter.  There were some streaks of bright red blood in the sigmoid and descending colon.  At the level of 35 cm from the rectum was another lesion which appeared to be ulcerated with nodularity, some necrotic material in the ulcerated area.  The center of the lesion as well as the edges were biopsied.  There was some bleeding from the lesion after it was biopsied.  Again, the lesion was nonobstructing and covering only one part of the wall; also about  2-3 cm diameter.  The rest of the colon was unremarkable with normal splenic flexure, transverse colon, hepatic flexure, ascending colon to the cecum.  Cecal pouch, ileocecal unremarkable.  Colonoscope was then retracted.  Video photographs of the both lesions were obtained.  IMPRESSION:  Two colon lesions at 25 and 35 cm, ulcerated, rule out neoplasm, status post multiple biopsies.  These lesions are most likely causing patients bleeding.  PLAN 1. Await results of the biopsies. 2. Hold heparin and Coumadin for now. 3. Obtain CEA tumor marker.  Depending on the histology, patient may need resection of these lesions. Dictated by:   Hedwig Morton. Juanda Chance, M.D. [facility] Attending Physician:  Sanjuana Letters DD:  04/26/01 TD:  04/26/01 Job: [id number]   FINAL DIAGNOSIS    ***MICROSCOPIC EXAMINATION AND DIAGNOSIS***    1. COLON, 35 CM, BIOPSY: ACTIVE CHRONIC MUCOSAL COLITIS WITH   ULCERATION, SURFACE EXUDATE AND INFLAMED GRANULATION TISSUE. NO   DYSPLASIA IDENTIFIED, SEE COMMENT.   2. COLON, 25 CM, BIOPSY: FRAGMENTS OF POLYPOID COLONIC MUCOSA .   ONE FRAGMENT WITH FOCAL ACTIVE MUCOSAL COLITIS AND HEMORRHAGE.   ASSOCIATED REACTIVE/REGENERATIVE GLANDULAR EPITHELIAL CHANGES.    COMMENT   1-2. The colon biopsy at 35 cm shows active chronic mucosal   colitis in several biopsy fragments. There is associated   inflamed granulation tissue and fibrinous exudate with   collections of neutrophils. A small  granuloma and crypt abscesses   are identified. There are a few biopsy fragments that are   relatively spared and show no significant crypt architectural   distortion or active mucosal inflammation. Given the endoscopic   appearance of ulceration at 35 cm and 25 cm and the pattern   suggesting inflammatory bowel disease, I would favor active   Crohn' s disease over ulcerative colitis. However, endoscopic   correlation would be helpful.   Dr. Almyra Free has also reviewed this material and  concurs. (EAA:kcv   11-26)    jn   Date Reported: 04/27/2001 Alden Server A. Delila Spence, MD   *** Electronically Signed Out By EAA ***    Clinical information   R/o cancer, ischemic colitis. Rectal bleeding. Ulcerated area at   35cm and infiltrating ulcer at 25cm (jc)    specimen(s) obtained   1: Colon, biopsy, 35cm   2: Colon, biopsy, 25cm    Gross Description   1. Received in formalin are tan, soft tissue fragments that are   submitted in toto. Number: 6   Size: 0.1 to 0.2 cm. Toto one cassette.    2. Received in formalin are tan, soft tissue fragments that are   submitted in toto. Number: 4   Size: 0.2 to 0.3 cm. Toto one cassette. (JBM:caf 04/26/01)    cf/

## 2010-07-02 NOTE — Progress Notes (Signed)
Summary: ? re surgery   Phone Note From Other Clinic Call back at [phone]  x [phone]   Caller: Tiffany from [facility] office Call For: Juanda Chance Summary of Call: Patient is wanting to know if she had her colonoscopy before or after surgery colon cancer surgery. Initial call taken by: Tawni Levy,  December 19, 2009 10:58 AM  Follow-up for Phone Call        per records seen in e-chart patient had intraoperative colon 04/21/2001, and colon resection on the same day.  No follow up appointment's or communication with patient to the best of my knowledge. Follow-up by: Darcey Nora RN, CGRN,  December 19, 2009 3:23 PM

## 2010-07-02 NOTE — Assessment & Plan Note (Signed)
Summary: PROBABLY RASH BACK/ STOMACH CONSTANTLY ITCHING//GK   Vital Signs:  Patient profile:   55 year old female Menstrual status:  hysterectomy Height:      64 inches Weight:      186 pounds BMI:     32.04 Temp:     97.9 degrees F oral Pulse rate:   106 / minute Pulse rhythm:   regular Resp:     18 per minute BP sitting:   126 / 84  (left arm) Cuff size:   large  Vitals Entered By: [place] Shannon (July 06, 2009 11:46 AM) CC: pt says she has a rash that  itches alot....  CBG Result 182  Does patient need assistance? Functional Status Self care Ambulation Normal   CC:  pt says she has a rash that  itches alot.... .  History of Present Illness: 55 year old female presents with possible rash.  This started a couple of weeks ago.  He notes it's worse at night.  She has several lesions scattered on her chest, abdomen, hands, feet, buttocks and axilla.  She denies fevers or chills.  She has had some URI symptoms recently.  She has a scratchy throat and some head congestion.  She denies chest pain or shortness of breath.  She notes that her son has the same thing.  She has not tried any medication for it.  She's concerned about bed bugs.  Current Medications (verified): 1)  Adult Aspirin Low Strength 81 Mg Chew (Aspirin) .... Take 1 Tablet By Mouth Once A  Day 2)  Ferrous Sulfate 325 (65 Fe) Mg Tabs (Ferrous Sulfate) .... Take 1 Tablet By Mouth Twice A  Day 3)  Metformin Hcl 1000 Mg  Tabs (Metformin Hcl) .... Two Times A Day 4)  Warfarin Sodium 5 Mg Tabs (Warfarin Sodium) .Marland Kitchen.. 1 Tab By Mouth Daily 5)  Novolog Mix 70/30 Flexpen 70-30 % Susp (Insulin Aspart Prot & Aspart) .... 52units Subcutaneously Before Breakfast and 20 Units Subcutaneously Before Dinner. 6)  Crestor 10 Mg Tabs (Rosuvastatin Calcium) .Marland Kitchen.. 1 Tab By Mouth Daily With Evening Meal 7)  Vitamin B-12 Cr 1000 Mcg Cr-Tabs (Cyanocobalamin) .Marland Kitchen.. 1 Tab By Mouth Daily 8)  Glucose Meter Test Strips .... Two Times A Day  Testing  Allergies (verified): 1)  ! Ibuprofen (Ibuprofen) 2)  Sulfamethoxazole (Sulfamethoxazole) 3)  Avandamet (Rosiglitazone-Metformin) 4)  Glucotrol (Glipizide) 5)  Morphine 6)  Vicodin (Hydrocodone-Acetaminophen)  Physical Exam  General:  alert, well-developed, and well-nourished.   Head:  normocephalic and atraumatic.   Eyes:  pupils equal, pupils round, and pupils reactive to light.   Ears:  R ear normal and L ear normal.   Nose:  no external deformity.   Mouth:  pharynx pink and moist, no erythema, and no exudates.   Neck:  supple and no cervical lymphadenopathy.   Lungs:  normal breath sounds, no crackles, and no wheezes.   Heart:  normal rate, regular rhythm, and no murmur.   Neurologic:  alert & oriented X3 and cranial nerves II-XII intact.   Skin:  several scatterd papular lesions with excoriations distributed in axilla, hands, feet, buttocks no burrows noted Psych:  normally interactive.    Diabetes Management Exam:    Foot Exam (with socks and/or shoes not present):       Sensory-Monofilament:          Left foot: normal          Right foot: normal   Impression & Recommendations:  Problem # 1:  SCABIES (ICD-133.0) Given the appearance of the lesions in the distribution, I feel that this is more than likely scabies.  She can use Benadryl p.r.n. She's been given a prescription for permethrin.  She's also been given instructions on washing her clothes and linens.  She's been instructed to have the rest of her family treated as well.  She is to follow up as needed  Problem # 2:  UPPER RESPIRATORY INFECTION (ICD-465.9) conservative management  Her updated medication list for this problem includes:    Adult Aspirin Low Strength 81 Mg Chew (Aspirin) .Marland Kitchen... Take 1 tablet by mouth once a  day  Complete Medication List: 1)  Adult Aspirin Low Strength 81 Mg Chew (Aspirin) .... Take 1 tablet by mouth once a  day 2)  Ferrous Sulfate 325 (65 Fe) Mg Tabs (Ferrous sulfate)  .... Take 1 tablet by mouth twice a  day 3)  Metformin Hcl 1000 Mg Tabs (Metformin hcl) .... Two times a day 4)  Warfarin Sodium 5 Mg Tabs (Warfarin sodium) .Marland Kitchen.. 1 tab by mouth daily 5)  Novolog Mix 70/30 Flexpen 70-30 % Susp (Insulin aspart prot & aspart) .... 52units subcutaneously before breakfast and 20 units subcutaneously before dinner. 6)  Crestor 10 Mg Tabs (Rosuvastatin calcium) .Marland Kitchen.. 1 tab by mouth daily with evening meal 7)  Vitamin B-12 Cr 1000 Mcg Cr-tabs (Cyanocobalamin) .Marland Kitchen.. 1 tab by mouth daily 8)  Glucose Meter Test Strips  .... Two times a day testing 9)  Permethrin 5 % Crea (Permethrin) .... After showering or bathing, apply from base of neck all over body to the tips of your toes and leave on for 8-14 hours and then wash off.  may repeat in 1 week if needed.  Patient Instructions: 1)  Wash all clothes, towels or items that come into contact that have been used within the last 4 days with your body in warm water or have them dry cleaned.   Items that cannot be washed or dry cleaned should be sealed in plastic for 3-5 days.  You should be ok if you just wash your sheets, unless you have been laying on the bed without sheets. 2)  Get benadryl and take 25 mg every 6-8 hours as needed for itching. Prescriptions: PERMETHRIN 5 % CREA (PERMETHRIN) After showering or bathing, apply from base of neck all over body to the tips of your toes and leave on for 8-14 hours and then wash off.  May repeat in 1 week if needed.  #30 grams x 1   Entered and Authorized by:   Tereso Newcomer PA-C   Signed by:   Tereso Newcomer PA-C on 07/06/2009   Method used:   Print then Give to Patient   RxID:   [id number]   Last LDL:                                                 37 (10/06/2008 9:31:00 PM)        Diabetic Foot Exam Last Podiatry Exam Date: 07/06/2009  Foot Inspection Is there a history of a foot ulcer?              No Is there a foot ulcer now?              No Is there swelling or an  abnormal foot shape?  No Are the toenails long?                No Are the toenails thick?                No Are the toenails ingrown?              No Is there heavy callous build-up?              No Is there a claw toe deformity?                          No Is there elevated skin temperature?            No Is there limited ankle dorsiflexion?            No Is there foot or ankle muscle weakness?            No Do you have pain in calf while walking?           No         10-g (5.07) Semmes-Weinstein Monofilament Test Performed by: [place] Shannon          Right Foot          Left Foot Visual Inspection               Test Control      normal         normal Site 1         normal         normal Site 2         normal         normal Site 3         normal         normal Site 4         normal         normal Site 5         normal         normal Site 6         normal         normal Site 7         normal         normal Site 8         normal         normal Site 9         normal         normal Site 10         normal         normal  Impression      normal         normal

## 2010-07-02 NOTE — Letter (Signed)
Summary: New Patient letter  [facility]  [address] [place], [place] [address]   Phone: [phone]  Fax: [phone]       04/02/2010 MRN: [id number]  [name] [address] [place], [place]  [address]  Dear [name],  Welcome to the Gastroenterology Division at [facility].    You are scheduled to see Dr.  Lina Sar on 04/08/10  at  9:30 AM on the 3rd floor at [facility], [address]  We ask that you try to arrive at our office 15 minutes prior to your appointment time to allow for check-in.  We would like you to complete the enclosed self-administered evaluation form prior to your visit and bring it with you on the day of your appointment.  We will review it with you.  Also, please bring a complete list of all your medications or, if you prefer, bring the medication bottles and we will list them.  Please bring your insurance card so that we may make a copy of it.  If your insurance requires a referral to see a specialist, please bring your referral form from your primary care physician.  Co-payments are due at the time of your visit and may be paid by cash, check or credit card.     Your office visit will consist of a consult with your physician (includes a physical exam), any laboratory testing he/she may order, scheduling of any necessary diagnostic testing (e.g. x-ray, ultrasound, CT-scan), and scheduling of a procedure (e.g. Endoscopy, Colonoscopy) if required.  Please allow enough time on your schedule to allow for any/all of these possibilities.    If you cannot keep your appointment, please call [phone] to cancel or reschedule prior to your appointment date.  This allows [place] the opportunity to schedule an appointment for another patient in need of care.  If you do not cancel or reschedule by 5 p.m. the business day prior to your appointment date, you will be charged a $50.00 late cancellation/no-show fee.    Thank  you for choosing  Gastroenterology for your medical needs.  We appreciate the opportunity to care for you.  Please visit [place] at our website  to learn more about our practice.                     Sincerely,                                                             The Gastroenterology Division

## 2010-07-02 NOTE — Op Note (Signed)
Summary: Flexible Sigmoidoscopy/bx                    [place] [place] [facility]  Patient:    [name], [name] Visit Number: [id number] MRN: [id number]          Service Type: Attending:  Hedwig Morton. Juanda Chance, M.D. [facility] Dictated by:   Hedwig Morton. Juanda Chance, M.D. [facility]                             Procedure Report  DATE OF BIRTH:  06/23/2055  NAME OF PROCEDURE:  Flexible sigmoidoscopy.  INDICATIONS:  This 55 year old Native [place] female has been evaluated for hematochezia.  She had a full colonoscopy two days ago with findings of a lesion in the sigmoid colon which was biopsied.  It was a malignant-appearing, infiltrating, ulcerative lesion in the sigmoid colon, but the biopsies were negative for malignancy.  She was at that time in a heparin window.  She was restarted on heparin and stopped again this morning until we biopsy the lesion and then I will look at it.  ENDOSCOPE:  Fujinon single-channel video endoscope.  SEDATION:  Versed 5 mg IV and Demerol 25 mg IV.  FINDINGS:  The Fujinon single-channel video endoscope was passed under direct vision through the rectum to the sigmoid colon.  The patient was monitored by pulse oximetry.  Oxygen saturations were satisfactory.  At the level of 5 cm from the rectum was a pedunculated polyp which was multilobulated.  It was easily removed with snare and sent to pathology.  At the level of 30-35 cm from the rectum was a depressed ulcerated lesion with raised edges.  It was rather firm, having a necrotic center.  It was difficult to biopsy because it was rather slight rather than exophytic.  It bed on passage of the scope. Multiple biopsies were obtained from the center of the lesion.  The tissue broke off the base of the ulcer, indicating some fibrotic element.  The patient tolerated the procedure well.  IMPRESSION: 1. Malignant-appearing ulcer/mass of the sigmoid colon at 35 cm, status post    repeat biopsies. 2. A left colon polyp at 5 cm,  status post polypectomy.  PLAN: 1. Stay off heparin. 2. Surgical consultation for possible resection. 3. Evaluate the results of the histology. 4. Resume diet. Dictated by:   Hedwig Morton. Juanda Chance, M.D. [facility] Attending:  Hedwig Morton. Juanda Chance, M.D. [facility] DD:  04/28/01 TD:  04/28/01 Job: [id number]    FINAL DIAGNOSIS    ***MICROSCOPIC EXAMINATION AND DIAGNOSIS***    1. COLON, 35 CM, MASS, BIOPSIES: INVASIVE COLONIC   ADENOCARCINOMA.    2. COLON, 5 CM, POLYP, BIOPSIES: FRAGMENTS OF SERRATED ADENOMA    COMMENT   The biopsies at 35 cm in the current material show an invasive   colonic adenocarcinoma which is not seen in the previous biopsy   material. Part 2 shows a serrated adenoma. Dr. Clelia Croft has   reviewed this material and concurs.    These findings were discussed with Dr. Lina Sar at 9:20 a.m.   no 04/30/01.    kv   Date Reported: 04/30/2001 Alden Server A. Delila Spence, MD   *** Electronically Signed Out By EAA ***    Clinical information   Repeat biospy of the colon lesion, rectal bleeding ca yes, 11/25   (wf)    specimen(s) obtained   1: Colon, biopsy, 35cm mass   2: Polyp, 5cm  Gross Description   1. Received in formalin are .1 to .3 cm fragments of soft whitish   grey to red tan tissue that measure 1 x .8 x .2 cm in aggregate.   Submitted in toto in one cassette.    2. Received in formalin is a polypoid portion of soft red tan   tissue that measures 1 x .8 x .3 cm. Submitted in toto in one   cassette. BM:mw 04-28-01    mw/

## 2010-07-02 NOTE — Progress Notes (Signed)
Summary: Appt sooner than 12-8   Phone Note From Other Clinic   Caller: [phone] [facility] @ Dr [facility] office Call For: Dr Juanda Chance Reason for Call: Schedule Patient Appt Summary of Call: Wants pt seen sooner than 12-8-1. Pt is at the office now ans will hold her there until we call back because it is very difficult to get a hold of her on the telephone. Initial call taken by: Leanor Kail [facility],  April 02, 2010 12:18 PM  Follow-up for Phone Call        Scheduled patient for 04/08/10 @ 9:30 AM. Called and spoke with patient at her home number re: this visit. New patient information mailed to patient. Appointment date and time given to [place] at Dr. Renne Crigler office also.  Follow-up by: Jesse Fall RN,  April 02, 2010 1:48 PM

## 2010-07-02 NOTE — Assessment & Plan Note (Signed)
Summary: 4 MONTH FU///KT   Vital Signs:  Patient profile:   55 year old female Menstrual status:  hysterectomy Weight:      188 pounds Temp:     98 degrees F oral Pulse rate:   80 / minute Pulse rhythm:   regular Resp:     18 per minute BP sitting:   132 / 81  (left arm) Cuff size:   large  Vitals Entered By: Geanie Cooley  (June 19, 2009 3:18 PM) CC: pt for followup on bp and hypertension. Pt states she is doing fine no issues or concerns. Is Patient Diabetic? Yes Pain Assessment Patient in pain? no      CBG Result 153  Does patient need assistance? Functional Status Self care Ambulation Normal   CC:  pt for followup on bp and hypertension. Pt states she is doing fine no issues or concerns..  History of Present Illness: 1.  Anticoagulation:  taking Coumadin 5 mg daily.  Has not been in for Protime since 10/10.  2.  Hx Stroke:  no new neurologic symptoms.  Is to be moved to a different apt. that will put her at decreased risk for falls.  No openings currently.  She is apparently first in line.  The manager told her they could not put up the railing.  She does not have his name or number currently.  3.  [place]:  Went to [facility]:  had to pay $170 up front and could not afford.  4.  DM:  sugars running 129-189 fasting.  A1C not at goal.    5.  Hyperlipidemia:  at goal last spring.  Have not received records from Drs. Magod or Warehouse manager.  Allergies (verified): 1)  ! Ibuprofen (Ibuprofen) 2)  Sulfamethoxazole (Sulfamethoxazole) 3)  Avandamet (Rosiglitazone-Metformin) 4)  Glucotrol (Glipizide) 5)  Morphine 6)  Vicodin (Hydrocodone-Acetaminophen)  Physical Exam  Lungs:  Normal respiratory effort, chest expands symmetrically. Lungs are clear to auscultation, no crackles or wheezes. Heart:  Normal rate and regular rhythm. S1 and S2 normal without gallop, murmur, click, rub or other extra sounds. Radial pulses normal and  equal   Impression & Recommendations:  Problem # 1:  URINARY INCONTINENCE (ICD-788.30) Resolved per pt.  Problem # 2:  COUMADIN THERAPY (ICD-V58.61)  Orders: T-Protime, Auto ([address])  Problem # 3:  CVA (ICD-434.91) Trying to optimize health concerns--discussed pt. needs to follow up regularly, come in monthly for PT.  Work on diet and exercise. Pt. will get back to me with name and number for her landlord to see if can get a railing for her front steps--until she is moved Her updated medication list for this problem includes:    Adult Aspirin Low Strength 81 Mg Chew (Aspirin) .Marland Kitchen... Take 1 tablet by mouth once a  day    Warfarin Sodium 5 Mg Tabs (Warfarin sodium) .Marland Kitchen... 1 tab by mouth daily  Orders: T-Protime, Auto ([id number])  Problem # 4:  HYPERTENSION (ICD-401.9) Controlled Orders: Nutrition Referral (Nutrition)  Problem # 5:  HYPERLIPIDEMIA (ICD-272.4) Controlled Her updated medication list for this problem includes:    Crestor 10 Mg Tabs (Rosuvastatin calcium) .Marland Kitchen... 1 tab by mouth daily with evening meal  Orders: Nutrition Referral (Nutrition)  Problem # 6:  DIABETES MELLITUS, TYPE II, UNCONTROLLED (ICD-250.02) Remains uncontrolled--increase morning insulin to 52 units Back to Diabetic education Her updated medication list for this problem includes:    Adult Aspirin Low Strength 81 Mg Chew (Aspirin) .Marland Kitchen... Take  1 tablet by mouth once a  day    Metformin Hcl 1000 Mg Tabs (Metformin hcl) .Marland Kitchen..Marland Kitchen Two times a day    Novolog Mix 70/30 Flexpen 70-30 % Susp (Insulin aspart prot & aspart) .Marland Kitchen... 52units subcutaneously before breakfast and 20 units subcutaneously before dinner.  Orders: Nutrition Referral (Nutrition)  Complete Medication List: 1)  Adult Aspirin Low Strength 81 Mg Chew (Aspirin) .... Take 1 tablet by mouth once a  day 2)  Ferrous Sulfate 325 (65 Fe) Mg Tabs (Ferrous sulfate) .... Take 1 tablet by mouth twice a  day 3)  Metformin Hcl 1000 Mg Tabs  (Metformin hcl) .... Two times a day 4)  Warfarin Sodium 5 Mg Tabs (Warfarin sodium) .Marland Kitchen.. 1 tab by mouth daily 5)  Novolog Mix 70/30 Flexpen 70-30 % Susp (Insulin aspart prot & aspart) .... 52units subcutaneously before breakfast and 20 units subcutaneously before dinner. 6)  Crestor 10 Mg Tabs (Rosuvastatin calcium) .Marland Kitchen.. 1 tab by mouth daily with evening meal 7)  Vitamin B-12 Cr 1000 Mcg Cr-tabs (Cyanocobalamin) .Marland Kitchen.. 1 tab by mouth daily 8)  Glucose Meter Test Strips  .... Two times a day testing  Other Orders: Capillary Blood Glucose/CBG ([address]) Flu Vaccine 50yrs + ([address]) Admin 1st Vaccine ([address]) Admin 1st Vaccine [facility]) [phone])  Patient Instructions: 1)  Follow up with Dr. Delrae Alfred in 3 months --DM Prescriptions: NOVOLOG MIX 70/30 FLEXPEN 70-30 % SUSP (INSULIN ASPART PROT & ASPART) 52units subcutaneously before breakfast and 20 units subcutaneously before dinner.  #1 month x 11   Entered and Authorized by:   [name] MD   Signed by:   [name] MD on 06/19/2009   Method used:   Faxed to ...       [facility] (retail)       [address] [place], [place]  [address]       Ph: [phone]       Fax: [phone]   RxID:   [id number]    Vital Signs:  Patient profile:   55 year old female Menstrual status:  hysterectomy Weight:      188 pounds Temp:     98 degrees F oral Pulse rate:   80 / minute Pulse rhythm:   regular Resp:     18 per minute BP sitting:   132 / 81  (left arm) Cuff size:   large  Vitals Entered By: Geanie Cooley  (June 19, 2009 3:18 PM)   Laboratory Results   Blood Tests   Date/Time Received: June 19, 2009 3:31 PM   HGBA1C: 8.1%   (Normal Range: Non-Diabetic - 3-6%   Control Diabetic - 6-8%) CBG Random:: 153mg /dL      Influenza Vaccine    Vaccine Type: Fluvax 3+    Site: left deltoid    Mfr: Sanofi Pasteur    Dose: 0.5 ml    Route: IM    Given by: Vesta Mixer CMA    Lot #: [id number]    VIS given: 12/24/06 version given June 19, 2009.  Flu Vaccine Consent Questions    Do you have a history of severe allergic reactions to this vaccine? no    Any prior history of allergic reactions to egg and/or gelatin? no    Do you have a sensitivity to the preservative Thimersol? no    Do you have a past history of Guillan-Barre Syndrome? no    Do you currently have an acute  febrile illness? no    Have you ever had a severe reaction to latex? no    Vaccine information given and explained to patient? yes    Are you currently pregnant? no

## 2010-07-02 NOTE — Progress Notes (Signed)
   Phone Note Outgoing Call   Call placed by: Jesse Fall RN,  April 05, 2010 3:29 PM Call placed to: Patient Summary of Call: Patient notified of schedule change on 04/08/10 to 3PM.. Patient states she will be here.

## 2010-07-02 NOTE — Consult Note (Signed)
Summary: Bleeding Ulcer of Sigmoid Colon                    [place]. [facility]  Patient:    [name], [name] Visit Number: [id number] MRN: [id number]          Service Type: MED Location: [id number] Attending Physician:  Sanjuana Letters Dictated by:   Angelia Mould. Derrell Lolling, M.D. Proc. Date: 04/28/01 Admit Date:  04/21/2001   CC:         William A. Leveda Anna, M.D.  Hedwig Morton. Juanda Chance, M.D. [facility]   Consultation Report  REASON FOR CONSULTATION:  Evaluate bleeding ulcer of sigmoid colon.  HISTORY OF PRESENT ILLNESS:  This is a 55 year old Native American woman who was admitted on April 21, 2001, with painless hematochezia. Her hemoglobin is stable at 9.7 to 10.3. She states that she has had some intermittent bleeding off and on for more than one year, but it was much more severe on the day of admission. She has been stable during her hospitalization. She was anticoagulated, so she was taken off of her Coumadin and placed on heparin. Colonoscopy was performed on April 26, 2000. That revealed an ulcerated mass at 25 cm. Biopsies, unfortunately, were not non diagnostic. Flexible sigmoidoscopy was repeated today, and Dontee Jaso M. Juanda Chance, M.D. reconfirmed that there is a 3.0 cm diameter ulcer at about 30 cm, thought to be in the mid sigmoid. This was rebiopsied. Also noted was a small polyp which was completely removed from the distal sigmoid colon. Hedwig Morton. Juanda Chance, M.D. feels that the ulcerated mass is carcinoma and is the source of bleeding, and we have been asked to see the patient to consider resection. The patient is doing well following that procedure, but it should be noted that she ate a regular diet yesterday and today.  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 She has a patent foramen ovale and is on lifelong Coumadin. She has type 2 diabetes. She has had a cholecystectomy in  1982. She had a barium enema in 1992 which showed "two sigmoid lesions." She states that Dr. Leonette Most ______ referred her to a gastroenterologist, but she never followed up. She is perimenopausal.  CURRENT MEDICATIONS:  (At home)  Aspirin, Coumadin, Glucophage, ramipril, Folvite.  DRUG ALLERGIES:  IBUPROFEN.  SOCIAL HISTORY:  The patient denies the use of alcohol, tobacco, or drugs. She works at the [employer]. She has four sons. She is separated. The father supports her financially and emotionally for the child. The husband has been helping her recover from strokes.  FAMILY HISTORY:  Severe alcoholism in the family. No history of breast or uterine cancer. The mother had colon cancer and diabetes.  PHYSICAL EXAMINATION:  GENERAL:  Pleasant, somewhat overweight, middle-aged, [place] woman in no distress. She is alert, cooperative, oriented.  VITAL SIGNS:  Temperature 96.9, heart rate 86, blood pressure 110/88.  HEENT:  Sclerae clear. Extraocular movements intact.  NECK:  Supple. Nontender. No mass. No bruits.  LUNGS:  Clear to auscultation. No CVA tenderness.  HEART:  Regular rate and rhythm. No murmur or click.  ABDOMEN:  Slightly obese, soft, nontender. No mass. There is a well-healed right subcostal scar.  LABORATORY DATA:  CEA 3.1. Liver function tests normal. Hemoglobin 9.9, platelet count 313. BUN 8, creatinine 0.6, glucose 170.  IMPRESSION: 1. 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 2. Distal sigmoid polyp, status post complete colonoscopic resection.    Pathology pending. 3. Patent foramen ovale, needs SBE prophylaxis for surgical procedures. 4. Status post pontine strokes, needs lifelong anticoagulation. 5. Diabetes mellitus, type 2.  PLAN: 1. We will place the patient on a liquid diet and complete her bowel prep    tomorrow and proceed with sigmoid colon resection on Friday, April 30, 2001. 2. We will hold her heparin until the postop period. 3. She will be given ampicillin and gentamicin as SBE prophylaxis    perioperatively. 4. The procedure, its risks, and recovery have been discussed with the patient    and her daughters in great detail. They understand all of the issues. At    this time, all of their questions are answered. They are all in agreement    with the plan. I will ask one of my partners to assume her care as I will    be out of town for the next four days. This is acceptable with her and her    family. Dictated by:   Angelia Mould. Derrell Lolling, M.D. Attending Physician:  Sanjuana Letters DD:  04/28/01 TD:  04/29/01 Job: [id number]

## 2010-07-02 NOTE — Progress Notes (Signed)
   Phone Note Outgoing Call   Summary of Call: Called pt regarding protime--held Warfarin for at least 2 days recently secondary to bleed from a cut.  Is taking 5 mg daily now.  Please call her and set up appt. for repeat protime in 1 week.  She is to give the name and phone number of the gentleman to call regarding safety issues at her living space. Initial call taken by: [name] MD,  June 20, 2009 10:55 AM  Follow-up for Phone Call        Lab appt made for 06/26/09 at 2:30.  Will get name of man from her then. Follow-up by: Vesta Mixer CMA,  June 20, 2009 4:55 PM

## 2010-07-02 NOTE — Letter (Signed)
Summary: Generic Letter  [facility]  [address] [place], [place] [address]   Phone: [phone]  Fax: [phone]    03/11/2010     Re:  YAJAIRA DOFFING      [address]      [place], [place]  [address]     To Whom It May Concern  [name] is a patient I follow at [facility].  She has multiple health concerns including:   diabetes mellitus, hypertension, hypercholesterolemia, history of stroke, history of colon cancer, anemia.  She is on chronic anticoagulation.   Her anemia is mild to moderate, but more concerning as she has not had adequate follow up of her colon cancer.  I am very concerned her anemia is secondary to a possible colon cancer recurrence.  I have been unable to get her to proceed with repeat colonoscopy and other important follow up of her health issues due to lack of funds.   She reports to me that she was having difficulties staying awake on her job, which certainly could be related to a health issue we have not had the chance to explore.  I am very concerned for her health at this point.            Sincerely,   [name] MD

## 2010-07-04 NOTE — Letter (Signed)
Summary: [facility] Instructions  [facility]  [address] [place], [place] [address]   Phone: [phone]  Fax: [phone]       [name]    Jun 22, 1955    MRN: [id number]       Procedure Day /Date: Friday 06/14/10     Arrival Time: 7:30 am     Procedure Time: 8:30 am     Location of Procedure:                    _x_  [facility] (4th Floor)  PREPARATION FOR COLONOSCOPY WITH MIRALAX  Starting 5 days prior to your procedure 06/10/10 (TODAY!) do not eat nuts, seeds, popcorn, corn, beans, peas,  salads, or any raw vegetables.  Do not take any fiber supplements (e.g. Metamucil, Citrucel, and Benefiber). ____________________________________________________________________________________________________   THE DAY BEFORE YOUR PROCEDURE         DATE: 06/13/10 DAY: Thursday  1   Drink clear liquids the entire day-NO SOLID FOOD  2   Do not drink anything colored red or purple.  Avoid juices with pulp.  No orange juice.  3   Drink at least 64 oz. (8 glasses) of fluid/clear liquids during the day to prevent dehydration and help the prep work efficiently.  CLEAR LIQUIDS INCLUDE: Water Jello Ice Popsicles Tea (sugar ok, no milk/cream) Powdered fruit flavored drinks Coffee (sugar ok, no milk/cream) Gatorade Juice: apple, white grape, white cranberry  Lemonade Clear bullion, consomm, broth Carbonated beverages (any kind) Strained chicken noodle soup Hard Candy  4   Mix the entire bottle of Miralax with 64 oz. of Gatorade/Powerade in the morning and put in the refrigerator to chill.  5   At 3:00 pm take 2 Dulcolax/Bisacodyl tablets.  6   At 4:30 pm take one Reglan/Metoclopramide tablet.  7  Starting at 5:00 pm drink one 8 oz glass of the Miralax mixture every 15-20 minutes until you have finished drinking the entire 64 oz.  You should finish drinking prep around 7:30 or 8:00 pm.  8   If you are nauseated, you may take the 2nd  Reglan/Metoclopramide tablet at 6:30 pm.        9    At 8:00 pm take 2 more DULCOLAX/Bisacodyl tablets.        THE DAY OF YOUR PROCEDURE      DATE:  06/14/10 DAY: Friday  You may drink clear liquids until 6:30 am  (2 HOURS BEFORE PROCEDURE).   MEDICATION INSTRUCTIONS  Unless otherwise instructed, you should take regular prescription medications with a small sip of water as early as possible the morning of your procedure. Diabetic Patients-Please see separate instructions.  CONTINUE COUMADIN AS PER DR Kittie Krizan'S RECOMMENDATIONS        OTHER INSTRUCTIONS  You will need a responsible adult at least 54 years of age to accompany you and drive you home.   This person must remain in the waiting room during your procedure.  Wear loose fitting clothing that is easily removed.  Leave jewelry and other valuables at home.  However, you may wish to bring a book to read or an iPod/MP3 player to listen to music as you wait for your procedure to start.  Remove all body piercing jewelry and leave at home.  Total time from sign-in until discharge is approximately 2-3 hours.  You should go home directly after your procedure and rest.  You can resume normal activities the day after your procedure.  The day of your procedure you should not:   Drive   Make legal decisions   Operate machinery   Drink alcohol   Return to work  You will receive specific instructions about eating, activities and medications before you leave.   The above instructions have been reviewed and explained to me by   _______________________    I fully understand and can verbalize these instructions _____________________________ Date _______

## 2010-07-04 NOTE — Procedures (Signed)
Summary: Colonoscopy  Patient: [name] Note: All result statuses are Final unless otherwise noted.  Tests: (1) Colonoscopy (COL)   COL Colonoscopy           DONE     [facility]     [address]     [place], [place]  [address]           COLONOSCOPY PROCEDURE REPORT           PATIENT:  [name], [name]  MR#:  [id number]     BIRTHDATE:  09-Apr-1956, 54 yrs. old  GENDER:  female     ENDOSCOPIST:  Hedwig Morton. Juanda Chance, MD     REF. BY:  [name], M.D.     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  Digital rectal exam was performed and     revealed no rectal masses.   The LB [id number] endoscope     was introduced through the anus and advanced to the cecum, which     was identified by both the appendix and ileocecal valve, without     limitations.  The quality of the prep was poor, using MiraLax.     The instrument was then slowly withdrawn as the colon was fully     examined.     <<PROCEDUREIMAGES>>           FINDINGS:  There was evidence of a prior segmental colectomy.     sigmoid anastomosis at 20 cm,, wide open, superficial ulcerations     and granulation tissue, also retained suture With standard     forceps, biopsy was obtained and sent to pathology (see image1,     image7, image8, and image9).  A diminutive polyp was found. 2 mm     polyp at 80 cm The polyp was removed using cold biopsy forceps     (see image6 and image5).  This was otherwise a normal examination     of the colon (see  image2, image3, image4, and image10).     Retroflexed views in the rectum revealed no abnormalities.    The     scope was then withdrawn from the patient and the procedure     completed.           COMPLICATIONS:  None     ENDOSCOPIC IMPRESSION:     1) Prior segmental colectomy     2) Diminutive polyp     3) Otherwise normal examination     no evidence of recurrent cancer     RECOMMENDATIONS:     1) Await pathology results     CT scan of the abdomen and pelvis for follow up of colon cancer,     with oral and IV contrast     REPEAT EXAM:  In 3 year(s) for.  ______________________________     Hedwig Morton. Juanda Chance, MD           CC:  [name], M.D.           n.     eSIGNED:   Hedwig Morton. Noheli Melder at 06/14/2010 09:10 AM           [name], [id number]  Note: An exclamation mark (!) indicates a result that was not dispersed into the flowsheet. Document Creation Date: 06/14/2010 9:10 AM _______________________________________________________________________  (1) Order result status: Final Collection or observation date-time: 06/14/2010 08:58 Requested date-time:  Receipt date-time:  Reported date-time:  Referring Physician:   Ordering Physician: Lina Sar [phone]) Specimen Source:  Source: Launa Grill Order Number: [id number] Lab site:   Appended Document: Colonoscopy     Procedures Next Due Date:    Colonoscopy: 06/2013

## 2010-07-04 NOTE — Letter (Signed)
Summary: Diabetic Instructions  [facility]  [address] [place], [place] [address]   Phone: [phone]  Fax: [phone]    LURLEAN KERNEN 09/01/1955 MRN: [id number]   _x  _   ORAL DIABETIC MEDICATION INSTRUCTIONS         METFORMIN The day before your procedure:   Take your diabetic pill as you do normally  The day of your procedure:   Do not take your diabetic pill    We will check your blood sugar levels during the admission process and again in Recovery before discharging you home  _______________________________________________________________________  _x  _   INSULIN (SHORT ACTING) MEDICATION INSTRUCTIONS (Novolog)   The day before your procedure:   Do not take your evening dose   The day of your procedure:   Do not take your morning dose

## 2010-07-04 NOTE — Progress Notes (Signed)
Summary: Reaction- Post CT   Phone Note From Other Clinic Call back at 258   Caller: Charity--Leb CT Call For: Dr. Juanda Chance Summary of Call: Patient had CT Scan doen yesterday and she is calling this morning to [facility] CT and saying last night her feet started to swell and get red. She also developed red patches all over her face. Charity said that the patient had no reaction yesterday and she has never had a reaction in the past. She would like to know if you would like to see the patient to advise or are you ok just advising to take benadryl over the phone? She also advised pt to call her primary care.  Initial call taken by: Harlow Mares CMA Duncan Dull),  June 20, 2010 8:43 AM  Follow-up for Phone Call        OK to start OTC benadryl 25 mg by mouth q 6 hrs and hava her PCP to see her. Follow-up by: Hart Carwin MD,  June 20, 2010 12:58 PM  Additional Follow-up for Phone Call Additional follow up Details #1::        pt went to her primary this morning and they gave her benadryl and ask her to advise [place] that she is having a allergic reaction to iv dye. I have noted it and advised her of what Dr. Juanda Chance said.  Additional Follow-up by: Harlow Mares CMA (AAMA),  June 20, 2010 1:33 PM   New Allergies: ! * IV DYE New Allergies: ! * IV DYE

## 2010-07-04 NOTE — Assessment & Plan Note (Signed)
Summary: Reaction to contrast    Vital Signs:  Patient profile:   55 year old female Menstrual status:  hysterectomy O2 Sat:      96 % on Room air Temp:     97.7 degrees F oral Pulse rate:   80 / minute Pulse rhythm:   regular Resp:     20 per minute BP sitting:   120 / 68  (right arm) Cuff size:   regular  Vitals Entered By: Dutch Quint RN (June 20, 2010 9:09 AM)  O2 Flow:  Room air   Patient Instructions: 1)  Seen by Jesse Fall 2)  You've had an allergic reaction to the contrast used in your CT yesterday. 3)  You have been given depo-medrol and Benadryl for your symptoms.  The Benadryl might make you sleepy for a while yet, go home and rest until the drowsiness goes away.  Do not drive or operate any equipment until this passes. 4)  Take Prednisone 10 mg. tablets by mouth, 3 tabs daily for two days, then 2 tabs daily for two days, then 1 tab daily for two days.  This has been sent to [facility]. 5)  You can take Benadryl 25 mg. by mouth, one tablet every 6 hours for the rash - it might make you sleepy, so take it at night. 6)  You can use Benadryl cream during the day for the itching.  Both Benadryl products can be bought over the counter. 7)  If your symptoms suddenly worsen or you have trouble breathing, go to the ED. 8)  Call if anything changes or if you have any questions.   Serial Vital Signs/Assessments:  Time      Position  BP       Pulse  Resp  Temp     By 9:47 AM             100/64   76     20    97    F  Theresa Laib RN 10:15 AM            106/80   76     20    96.8  Henriette Combs RN 10:35 AM            112/78   76     20    97.1  Henriette Combs RN 10:48 AM            110/82   76     20    97.0  F  Theresa Laib RN                                PEF    PreRx  PostRx Time      O2 Sat  O2 Type     L/min  L/min  L/min   By 9:47 AM   96  %   Room air                          Dutch Quint RN 10:15 AM  94  %   Room air                          Dutch Quint RN   Physical Exam  Lungs:  normal breath sounds.  no wheezes or rhonchi Heart:  normal  rate and regular rhythm.   Skin:  Face is reddened, edema noted to entire face.  Pinpoint diffuse red rash to BLE, less reddened to both arms. Psych:  Oriented X3.     Impression & Recommendations:  Problem # 1:  OTHER ANAPHYLACTIC REACTION (ICD-995.0) likely reaction to IV contrast given for CT Orders: Depo- Medrol 80mg  (J1040) Admin of Therapeutic Inj  intramuscular or subcutaneous (16109) Benadryl  IM or IV (J1200) D5W 0.45% NS (EMRZERO) Intravenous Infusion Therapy Diag 1 hr (60454) Est. Patient Level II (09811)  Complete Medication List: 1)  Adult Aspirin Low Strength 81 Mg Chew (Aspirin) .... Take 1 tablet by mouth once a  day 2)  Ferrous Sulfate 325 (65 Fe) Mg Tabs (Ferrous sulfate) .... Take 1 tablet by mouth twice a  day 3)  Metformin Hcl 1000 Mg Tabs (Metformin hcl) .... Two times a day 4)  Warfarin Sodium 5 Mg Tabs (Warfarin sodium) .Marland Kitchen.. 1 tab by mouth daily 5)  Novolog Mix 70/30 Flexpen 70-30 % Susp (Insulin aspart prot & aspart) .Marland Kitchen.. 10 units subcutaneously before breakfast and 15 units subcutaneously before dinner. 6)  Crestor 10 Mg Tabs (Rosuvastatin calcium) .Marland Kitchen.. 1 tab by mouth daily with evening meal 7)  Vitamin B-12 Cr 1000 Mcg Cr-tabs (Cyanocobalamin) .Marland Kitchen.. 1 tab by mouth daily 8)  Glucose Meter Test Strips  .... Two times a day testing 9)  Dulcolax 5 Mg Tbec (Bisacodyl) .... Day before procedure take 2 at 3pm and 2 at 8pm. 10)  Reglan 10 Mg Tabs (Metoclopramide hcl) .... As per prep instructions. 11)  Prednisone 10 Mg Tabs (Prednisone) .... 3 tabs by mouth daily for 2 days 2 tabs by mouth daily for 2 days 1 tab by mouth daily for 2 days  Anticoagulation Management Assessment/Plan:      The patient's current anticoagulation dose is Warfarin sodium 5 mg tabs: 1 tab by mouth daily.  The next INR is due 4 weeks.        CC: States allergic reaction to CT  contrast Is Patient Diabetic? Yes Did you bring your meter with you today? No CBG Result 126 CBG Device ID B   Primary Care Provider:  [name], MD   CC:  States allergic reaction to CT contrast.  History of Present Illness: Pt. walked in stating having a reaction to CT contrast medium.  Had CT yesterday at [facility], says they told her to come here this morning.  States itching, rash to BLE, on face, arms, some swelling and some SOB.  Drank oral contrast during CT scan.  Started itching yesterday, c/o numbness ascending from feet, going up body.  Anticoagulation Management History:      Positive risk factors for bleeding include history of CVA/TIA and presence of serious comorbidities.  Negative risk factors for bleeding include an age less than 78 years old.  The bleeding index is 'intermediate risk'.  Positive CHADS2 values include History of HTN, History of Diabetes, and Prior Stroke/CVA/TIA.  Negative CHADS2 values include Age > 42 years old.  Her last INR was 1.1 ratio.        Medication Administration  Injection # 1:    Medication: Depo- Medrol 80mg     Diagnosis: OTHER ANAPHYLACTIC REACTION (ICD-995.0)    Route: SQ    Site: L deltoid    Exp Date: 12/2010    Lot #: [id number]    Mfr: Pharmacia    Comments: NDC [id number]    Patient tolerated injection without complications  Given by: Gaylyn Cheers RN (June 20, 2010 9:42 AM)  Injection # 2:    Medication: Benadryl  IM or IV    Diagnosis: OTHER ANAPHYLACTIC REACTION (ICD-995.0)    Route: IV    Exp Date: 03/2011    Lot #: [id number]    Mfr: [email or web address]    Comments: NDC [id number]    Patient tolerated injection without complications    Given by: Dutch Quint RN (June 20, 2010 9:47 AM)  Infusion # 1:    Diagnosis: OTHER ANAPHYLACTIC REACTION (ICD-995.0)    Started: 9:40 AM    Stopped: 10: 55 AM    Solution: D5W 0.45% NS    Route: IV infusion    Site: L antecubital fossa    Administered by: Aggie Cosier  Laib RN-June 20, 2010 10:14 AM    Patient tolerated infusion without complications  Infusion # 3:    Diagnosis: OTHER ANAPHYLACTIC REACTION (ICD-995.0)  Orders Added: 1)  Depo- Medrol 80mg  [J1040] 2)  Admin of Therapeutic Inj  intramuscular or subcutaneous [96372] 3)  Benadryl  IM or IV [J1200] 4)  D5W 0.45% NS [EMRZERO] 5)  Intravenous Infusion Therapy Diag 1 hr [96360] 6)  Est. Patient Level II [29528]

## 2010-07-04 NOTE — Progress Notes (Signed)
   Phone Note Call from Patient Call back at Home Phone [phone]   Caller: Patient Call For: nurse Summary of Call: Patient calling because her blood sugar is 81 and she feels shakey. She is on clear liquids for procedure in AM. Told patient to drink gatoraide, apple juice or regular soda. She states she has been drinking diet drinks but she will do as suggested. Initial call taken by: Jesse Fall RN,  June 13, 2010 3:46 PM  Follow-up for Phone Call        reviewed and agree. Follow-up by: Hart Carwin MD,  June 13, 2010 9:23 PM

## 2010-07-04 NOTE — Assessment & Plan Note (Signed)
Summary: f/u...as.    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 2 lesions were found. She was lost to followup and has not come back for recommended colonoscopy. She had a CVA several years ago and was put on Coumadin. She has patent foramen ovale and has to stay on Coumadin permanently. There is a history of an open cholecystectomy in 1982 and a hysterectomy around that time as well. She has been losing weight; about 15 pounds recently. She sees bright red blood occasionally but denies any abdominal pain, nausea or vomiting. Her last colonoscopy was before  the  sigmoid resection in 2002.   GI Review of Systems    Reports bloating and  weight loss.      Denies abdominal pain, acid reflux, belching, chest pain, dysphagia with liquids, dysphagia with solids, heartburn, loss of appetite, nausea, vomiting, vomiting blood, and  weight gain.      Reports constipation and  rectal bleeding.     Denies anal fissure, black tarry stools, change in bowel habit, diarrhea, diverticulosis, fecal incontinence, heme positive stool, hemorrhoids, irritable bowel syndrome, jaundice, light color stool, liver problems, and  rectal pain.    Current Medications (verified): 1)  Adult Aspirin Low Strength 81 Mg Chew (Aspirin) .... Take 1 Tablet By Mouth Once A  Day 2)  Ferrous Sulfate 325 (65 Fe) Mg Tabs (Ferrous Sulfate) .... Take 1 Tablet By Mouth Twice A  Day 3)  Metformin Hcl 1000 Mg  Tabs (Metformin Hcl) .... Two Times A Day 4)  Warfarin Sodium 5 Mg Tabs (Warfarin Sodium) .Marland Kitchen.. 1 Tab By Mouth Daily 5)  Novolog Mix  70/30 Flexpen 70-30 % Susp (Insulin Aspart Prot & Aspart) .Marland Kitchen.. 10 Units Subcutaneously Before Breakfast and 15 Units Subcutaneously Before Dinner. 6)  Crestor 10 Mg Tabs (Rosuvastatin Calcium) .Marland Kitchen.. 1 Tab By Mouth Daily With Evening Meal 7)  Vitamin B-12 Cr 1000 Mcg Cr-Tabs (Cyanocobalamin) .Marland Kitchen.. 1 Tab By Mouth Daily 8)  Glucose Meter Test Strips .... Two Times A Day Testing  Allergies (verified): 1)  ! Ibuprofen (Ibuprofen) 2)  Sulfamethoxazole (Sulfamethoxazole) 3)  Avandamet (Rosiglitazone-Metformin) 4)  Glucotrol (Glipizide) 5)  Morphine 6)  Vicodin (Hydrocodone-Acetaminophen)   Past History:  Past Medical History: Reviewed history from 12/17/2008 and no changes required. PATENT FORAMEN OVALE (ICD-745.5) ADENOCARCINOMA, SIGMOID COLON, HX OF (ICD-V10.05) CANDIDIASIS, VAGINAL (ICD-112.1) EXTERNAL OTITIS (ICD-380.10) PULMONARY TUBERCULOSIS, HX OF (ICD-V12.01) CHEST PAIN (ICD-786.50) EPISTAXIS (ICD-784.7) CVA (ICD-434.91) COUMADIN THERAPY (ICD-V58.61) ENCOUNTER FOR THERAPEUTIC DRUG MONITORING (ICD-V58.83) COLONIC POLYPS, HX OF (ICD-V12.72) SINUSITIS, ACUTE NOS (ICD-461.9) ANEMIA, B12 DEFICIENCY (ICD-281.1) STROKE (ICD-434.91) HYPERTENSION (ICD-401.9) HYPERLIPIDEMIA (ICD-272.4) DIABETES MELLITUS, TYPE II, UNCONTROLLED (ICD-250.02)  Past Surgical History: Reviewed history from 04/05/2010 and no changes required. 1.  1982:  Cholecystectomy 2.  04/21/2001:  Sigmoid Colectomy. 3.  11/28/04:  Total Vaginal Hysterectomy:  Dr Kandee Keen to symptomatic Leiomyoma Port A Cath Placment  Family History: Reviewed history from 04/05/2010 and no changes required. Mother, died 62:  colon CA,  DM Father, died 31:  pulmonary TB--did not seek treatment, CAD, hypertension. 8 brothers and 3 sisters:  Older sister with DM. 4 sons:  62, 75,  28, 30:  all healthy Family History of Heart Disease: Father Family History of Cirrhosis: Father  Social History: Reviewed history from 04/05/2010  and no changes required. Originally from [place] Changing last name back to [place]. Recently divorced. 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 Lives at home with 13 yo son.   Tobacco Use:  none Alcohol use: none Drug use:  none  Review of Systems  The patient denies allergy/sinus, anemia, anxiety-new, arthritis/joint pain, back pain, breast changes/lumps, change in vision, confusion, cough, coughing up blood, depression-new, fainting, fatigue, fever, headaches-new, hearing problems, heart murmur, heart rhythm changes, itching, menstrual pain, muscle pains/cramps, night sweats, nosebleeds, pregnancy symptoms, shortness of breath, skin rash, sleeping problems, sore throat, swelling of feet/legs, swollen lymph glands, thirst - excessive , urination - excessive , urination changes/pain, urine leakage, vision changes, and voice change.         Pertinent positive and negative review of systems were noted in the above HPI. All other ROS was otherwise negative.   Vital Signs:  Patient profile:   55 year old female Menstrual status:  hysterectomy Height:      63.5 inches Weight:      168 pounds BMI:     29.40 BSA:     1.81 Pulse rate:   88 / minute Pulse rhythm:   regular BP sitting:   132 / 76  (left arm) Cuff size:   regular  Vitals Entered By: Ok Anis CMA (June 10, 2010 2:52 PM)  Physical Exam  General:  Well developed, well nourished, no acute distress. Eyes:  PERRLA, no icterus. Mouth:  No deformity or lesions, dentition normal. Neck:  Supple; no masses or thyromegaly. Lungs:  Clear throughout to auscultation. Heart:  Regular rate and rhythm; no murmurs, rubs,  or bruits. Abdomen:  soft nontender abdomen with well-healed surgical scars. Liver edge at costal margin. No distention. Rectal:  normal perianal area. Rectal tone is normal. Stool is strongly Hemoccult positive. Extremities:  No clubbing, cyanosis,  edema or deformities noted. Skin:  Intact without significant lesions or rashes. Psych:  Alert and cooperative. Normal mood and affect.   Impression & Recommendations:  Problem # 1:  ADENOCARCINOMA, SIGMOID COLON, HX OF (ICD-V10.05) Patient had stage III adenocarcinoma of the sigmoid colon in 2002 and is status post sigmoid resection with no followup since then. She is now strongly Hemoccult-positive. There has been weight loss and decreased appetite. We need to rule out recurrent carcinoma. We will schedule her for a colonoscopy and obtain a CEA level, CBC and anemia profile. Further decisions will be made after her colonoscopy. Orders: T-CEA ([id number]) Colonoscopy (Colon) TLB-CBC Platelet - w/Differential (85025-CBCD) TLB-B12, Serum-Total ONLY (14782-N56) TLB-Ferritin (82728-FER) TLB-Folic Acid (Folate) (82746-FOL) TLB-Iron, (Fe) Total (83540-FE) TLB-IBC Pnl (Iron/FE;Transferrin) (83550-IBC) TLB-PT (Protime) (85610-PTP)  Problem # 2:  Family Hx of COLON CANCER (ICD-153.9) Colonoscopy pending.  Problem # 3:  ANEMIA (ICD-285.9) We will obtain an anemia panel today. Orders: T-CEA ([id number]) TLB-CBC Platelet - w/Differential (85025-CBCD) TLB-B12, Serum-Total ONLY (69629-B28) TLB-Ferritin (82728-FER) TLB-Folic Acid (Folate) (82746-FOL) TLB-Iron, (Fe) Total (83540-FE) TLB-IBC Pnl (Iron/FE;Transferrin) (83550-IBC) TLB-PT (Protime) (85610-PTP)  Problem # 4:  PATENT FORAMEN OVALE (ICD-745.5) Patient is status post CVA x 2. She is on Coumadin permanently. She will continue on Coumadin during the colonoscopy.  Patient Instructions: 1)   You have been scheduled for a colonoscopy. Please follow written prep instructions that were given to you today at your visit. 2)  You should  remain ON Coumadin throughout your procedure as per Dr Lina Sar. 3)  Please follow diabetic instructions given to you along with your prep instructions for colonoscopy. 4)  Your physician requests that  you go to the basement floor of our office to have the following labwork completed before leaving today: CBC, Anemia Profile, CEA Level and Protime. 5)  Copy sent to : DR Philis Nettle. [place] Prescriptions: REGLAN 10 MG  TABS (METOCLOPRAMIDE HCL) As per prep instructions.  #2 x 0   Entered by:   Lamona Curl CMA (AAMA)   Authorized by:   Hart Carwin MD   Signed by:   Lamona Curl CMA (AAMA) on 06/10/2010   Method used:   Faxed to ...       [facility] - [facility] (retail)       [address] [place], [place]  [address]       Ph: [phone]       Fax: [phone]   RxID:   [id number] DULCOLAX 5 MG  TBEC (BISACODYL) Day before procedure take 2 at 3pm and 2 at 8pm.  #4 x 0   Entered by:   Lamona Curl CMA (AAMA)   Authorized by:   Hart Carwin MD   Signed by:   Lamona Curl CMA (AAMA) on 06/10/2010   Method used:   Faxed to ...       [facility] - [facility] (retail)       [address] [place], [place]  [address]       Ph: [phone]       Fax: [phone]   RxID:   [id number]

## 2010-07-04 NOTE — Progress Notes (Signed)
   Phone Note Outgoing Call   Call placed by: Jesse Fall, RN Call placed to: Patient Summary of Call: Called patient with CT results as per Dr. Juanda Chance. Patient states she is at Dr. Renne Crigler office now due to allergic reaction to CT constrast. C/O rash, SOB and itching. Initial call taken by: Jesse Fall RN,  June 20, 2010 9:14 AM

## 2010-07-04 NOTE — Progress Notes (Signed)
Summary: Schedule CT scan abdomen and pelvis   Phone Note Outgoing Call Call back at [facility] Phone [phone]   Call placed by: Darcey Nora RN, CGRN,  June 17, 2010 10:54 AM Call placed to: Patient Summary of Call: Patient is scheduled for CT scan [place] on 06/19/10 10:30.  Patient is instructed to hold her Metformin on 06/19/10 and can resume it on Saturday 06/22/10.  Patient is also instructed to start drinking her contrast at 8:30 and arrive at the [facility] office at 10:15 Initial call taken by: Darcey Nora RN, CGRN,  June 17, 2010 10:58 AM     Appended Document: Schedule CT scan abdomen and pelvis    Clinical Lists Changes  Orders: Added new Referral order of CT Abdomen/Pelvis with Contrast (CT Abd/Pelvis w/con) - Signed

## 2010-07-04 NOTE — Miscellaneous (Signed)
Summary: Orders Update  Clinical Lists Changes  Orders: Added new Test order of TLB-BUN (Urea Nitrogen) (84520-BUN) - Signed Added new Test order of TLB-Creatinine, Blood (82565-CREA) - Signed 

## 2010-07-04 NOTE — Letter (Addendum)
Summary: Patient Notice- Colon Biospy Results  [facility]  [address] [place], [place] [address]   Phone: [phone]  Fax: [phone]        June 18, 2010 MRN: [id number]    [name] [address] [place], [place]  [address]    Dear [name],  I am pleased to inform you that the biopsies taken during your recent colonoscopy did not show any evidence of cancer upon pathologic examination.  Additional information/recommendations:  __No further action is needed at this time.  Please follow-up with      your primary care physician for your other healthcare needs.  __Please call [phone] to schedule a return visit to review      your condition.  _Keep Your appointment for CT scan.  _x_You should have a repeat colonoscopy examination for this problem           in 3  _ years.  Please call us if you are having persistent problems or have questions about your condition that have not been fully answered at this time.  Sincerely,  Hart Carwin MD   This letter has been electronically signed by your physician.  Appended Document: Patient Notice- Colon Biospy Results Letter mailed

## 2010-07-10 NOTE — Letter (Signed)
Summary: CALL A NURSE  CALL A NURSE   Imported By: Arta Bruce 07/05/2010 10:53:26  _____________________________________________________________________  External Attachment:    Type:   Image     Comment:   External Document

## 2010-08-05 NOTE — H&P (Signed)
NAME:  [name], [name] NO.:  [id number]  MEDICAL RECORD NO.:  [id number]          PATIENT TYPE:  INP  LOCATION:  0101                         FACILITY:  [facility]  PHYSICIAN:  Della Goo, M.D. DATE OF BIRTH:  April 26, 1956  DATE OF ADMISSION:  06/23/2010 DATE OF DISCHARGE:                             HISTORY & PHYSICAL   DATE OF ADMISSION:  06/23/2010  PRIMARY CARE PHYSICIAN:  Marcene Duos, M.D.  CHIEF COMPLAINTS:  Worsening redness of both legs and rash all over.  HISTORY OF PRESENT ILLNESS:  This is a 55 year old female who presents to the emergency department secondary to complaints of a rash on her legs which has been worsening over the past few days.  The pain at rest began on her feet and continued to rise up her legs.  She reports the rash is itchy and is nontender.  The areas on her leg are also warm to touch.  She also reports having eruptions of a rash on her chest and back area.  This area is blotchy and itches as well.  The patient states that on Wednesday 3 days ago, she underwent a CT scan of the abdomen secondary to her history of colon cancer where she was given IV contrast and feels that this may have been a reaction through the IV contrast dye.  The patient reports similar reaction in the past when she had morphine, but the reaction was not as bad.  The patient was seen in the emergency department by the EDP and she was started on therapy of Solu-Medrol and Pepcid therapy and referred for medical admission.  PAST MEDICAL HISTORY: 1. Significant for a previous CVA with left-sided hemiparesis in 2003,     now on Coumadin therapy. 2. Type 2 diabetes mellitus. 3. Dyslipidemia. 4. History of colon cancer status post colon resection and     chemotherapy, the original colon cancer diagnosed in 2002.  She     sees Dr. Darnelle Catalan for oncology.  PAST SURGICAL HISTORY:  Also included the cholecystectomy.  MEDICATIONS: 1. Coumadin 5 mg  p.o. daily. 2. NovoLog insulin 10 units at night and 20 units in the a.m. 3. Crestor. 4. Vitamin B12. 5. Aspirin. 6. Iron. 7. The patient had been on metformin but has not taken metformin for     the past 3 days since her CAT scan.  ALLERGIES:  IBUPROFEN, MORPHINE, SULFA, and ? for CIPRO.  She believes she may be allergic to CIPRO as well.  SOCIAL HISTORY:  The patient is a nonsmoker, nondrinker.  No history of illicit drug usage.  FAMILY HISTORY:  Positive for hypertension in her father.  Positive diabetes in her mother and positive colon cancer in her mother.  REVIEW OF SYSTEMS:  Pertinents as mentioned above.  PHYSICAL EXAMINATION FINDINGS:  GENERAL:  This is a 55 year old well- nourished, well-developed native American female who is in no acute distress. VITAL SIGNS:  Her temperature 98.4, blood pressure 127/75, heart rate 109, respirations 20, O2 saturations 96%. HEENT EXAMINATION:  Normocephalic, atraumatic.  Pupils equally round, reactive to light.  Extraocular movements are intact.  Funduscopic benign.  Nares are patent bilaterally.  Oropharynx is  clear.  Neck is supple.  Full range of motion.  No thyromegaly, adenopathy, or jugular venous distention. CARDIOVASCULAR:  Regular rate and rhythm.  No murmurs, gallops or rubs appreciated. LUNGS:  Clear to auscultation bilaterally.  No rales, rhonchi or wheezes. ABDOMEN:  Positive bowel sounds, soft, nontender, nondistended.  No hepatosplenomegaly. EXTREMITIES:  Reveal a confluent eruption along three-fourth of her distal lower extremities bilaterally.  This area of confluent is symmetric.  It is nontender to palpation, nonblanching.  She also has urticarial areas on her abdomen, chest, and back. NEUROLOGIC EXAMINATION:  Nonfocal.  LABORATORY STUDIES:  White blood cell count 9.3, hemoglobin 12.2, hematocrit 37.5, MCV 74.3, platelets 165,000, neutrophils 85%, lymphocytes 7%.  Sodium 135, potassium 3.5, chloride 125, CO2  24, BUN 14, creatinine 0.69 and glucose 192.  Protime 21.9, INR 1.89, erythrocyte sedimentation rate 19.  ASSESSMENT:  [age]-year-old female being admitted with; 1. Allergic reaction versus vasculitis. 2. Hyperglycemia with type 2 diabetes mellitus. 3. Dyslipidemia. 4. Coagulopathy secondary to Coumadin therapy.  PLAN:  The patient will be admitted and has been started on therapy of IV Solu-Medrol and IV Pepcid. Benadryl therapy will also be ordered. An IgE level will be added to the labs that have been already done as it is possible.  A dermatology verses a rheumatology consultation will be considered.  The patient's regular medications will be further verified and the patient will be placed on sliding-scale insulin therapy. Coumadin therapy will be managed by pharmacy.  The patient is a full code.     Della Goo, M.D.     HJ/MEDQ  D:  06/23/2010  T:  06/23/2010  Job:  [id number]  Electronically Signed by Della Goo M.D. on 08/05/2010 07:02:48 PM

## 2010-08-17 ENCOUNTER — Emergency Department (HOSPITAL_COMMUNITY): Payer: No Typology Code available for payment source

## 2010-08-17 ENCOUNTER — Emergency Department (HOSPITAL_COMMUNITY)
Admission: EM | Admit: 2010-08-17 | Discharge: 2010-08-17 | Disposition: A | Discharge status: Home / Self Care | Payer: No Typology Code available for payment source | Attending: Emergency Medicine | Admitting: Emergency Medicine

## 2010-08-17 DIAGNOSIS — E119 Type 2 diabetes mellitus without complications: Secondary | ICD-10-CM | POA: Insufficient documentation

## 2010-08-17 DIAGNOSIS — Z8673 Personal history of transient ischemic attack (TIA), and cerebral infarction without residual deficits: Secondary | ICD-10-CM | POA: Insufficient documentation

## 2010-08-17 DIAGNOSIS — R079 Chest pain, unspecified: Secondary | ICD-10-CM | POA: Insufficient documentation

## 2010-08-17 DIAGNOSIS — Z79899 Other long term (current) drug therapy: Secondary | ICD-10-CM | POA: Insufficient documentation

## 2010-08-17 DIAGNOSIS — M25559 Pain in unspecified hip: Secondary | ICD-10-CM | POA: Insufficient documentation

## 2010-08-17 DIAGNOSIS — T07XXXA Unspecified multiple injuries, initial encounter: Secondary | ICD-10-CM | POA: Insufficient documentation

## 2010-08-17 DIAGNOSIS — Z7901 Long term (current) use of anticoagulants: Secondary | ICD-10-CM | POA: Insufficient documentation

## 2010-08-17 DIAGNOSIS — Z85038 Personal history of other malignant neoplasm of large intestine: Secondary | ICD-10-CM | POA: Insufficient documentation

## 2010-08-17 DIAGNOSIS — R1011 Right upper quadrant pain: Secondary | ICD-10-CM | POA: Insufficient documentation

## 2010-08-17 LAB — PROTIME-INR
INR: 1.69 — ABNORMAL HIGH (ref 0.00–1.49)
Prothrombin Time: 20.1 seconds — ABNORMAL HIGH (ref 11.6–15.2)

## 2010-08-17 LAB — CBC
MCH: 24.3 pg — ABNORMAL LOW (ref 26.0–34.0)
MCHC: 32 g/dL (ref 30.0–36.0)
MCV: 75.7 fL — ABNORMAL LOW (ref 78.0–100.0)
Platelets: 178 10*3/uL (ref 150–400)

## 2010-08-17 LAB — BASIC METABOLIC PANEL
BUN: 13 mg/dL (ref 6–23)
CO2: 23 mEq/L (ref 19–32)
Calcium: 8.4 mg/dL (ref 8.4–10.5)
Creatinine, Ser: 0.72 mg/dL (ref 0.4–1.2)
GFR calc Af Amer: >60 mL/min (ref >60)

## 2010-08-17 LAB — DIFFERENTIAL
Basophils Relative: 0 % (ref 0–1)
Eosinophils Absolute: 0.2 10*3/uL (ref 0.0–0.7)
Monocytes Absolute: 0.5 10*3/uL (ref 0.1–1.0)
Monocytes Relative: 7 % (ref 3–12)

## 2010-08-27 ENCOUNTER — Encounter (INDEPENDENT_AMBULATORY_CARE_PROVIDER_SITE_OTHER): Payer: Self-pay | Admitting: Internal Medicine

## 2010-08-27 ENCOUNTER — Encounter: Payer: Self-pay | Admitting: Internal Medicine

## 2010-09-03 NOTE — Letter (Signed)
Summary: ANTICOAGULATION DOSING  ANTICOAGULATION DOSING   Imported By: Arta Bruce 08/29/2010 15:45:45  _____________________________________________________________________  External Attachment:    Type:   Image     Comment:   External Document
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
## 2010-10-18 NOTE — Consult Note (Signed)
[place]. [facility]  Patient:    [name], [name] Visit Number: [id number] MRN: [id number]          Service Type: MED Location: [phone] Attending Physician:  Tobin Chad Dictated by:   Charlaine Dalton. Sherene Sires, M.D. [facility] Proc. Date: 08/19/01 Admit Date:  08/16/2001                            Consultation Report  REFERRING PHYSICIAN:  Wayne A. Sheffield Slider, M.D.  REASON FOR CONSULTATION:  Cavitary lung mass.  HISTORY OF PRESENT ILLNESS:  This is a very complicated 56 year old white female on chemotherapy for colon cancer who presented to the hospital initially with a rash that was felt to be a possible drug reaction associated with generalized weakness and mild dyspnea.  She has had a mild dry cough with a chest x-ray that suggested a cavitary mass in the right lung.  She previously was hospitalized in December and actually had residual infiltrates on films in December and denies any real change in her symptoms recently other than that related to the rash with generalized weakness.  She has had subjective fevers and chills but no documented fever in the hospital.  She has not had any sputum production nor any pleuritic pain without any PND.  PAST MEDICAL HISTORY:  Colon cancer stage III, unknown level.  It is not clear to me whether it is the rectosigmoid area or higher in the colon.  Diabetes mellitus type 2.  Status post CVA.  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  Status post cholecystectomy.  ALLERGIES:  IBUPROFEN causes respiratory compromise.  It is not clear what she might have been allergic to recently but has been on Tequin for UTI.  MEDICATIONS:  Coumadin and Glucophage.  SOCIAL HISTORY:  She has never smoked.  She is a native [employer].  She denies any alcohol history.  FAMILY HISTORY:  Significant for alcoholism but negative for respiratory diseases or tuberculosis.  REVIEW OF SYSTEMS:  Taken in detail and also  reviewed from the excellent H&P by the house staff.  Essentially negative except as noted above.  Negative for active sinus complaints, recent dental work, or risk factors for aspiration.  PHYSICAL EXAMINATION:  GENERAL:  This is a native [place] middle aged white female in no acute distress.  VITAL SIGNS:  She has stable vital signs.  In fact, she has had no fever since admission.  HEENT:  Unremarkable.  Dentition is intact.  NECK:  Clear without cervical adenopathy or tenderness.  Trachea is negative.  LUNGS:  Lung fields reveal a few rhonchi bilaterally with nothing localized.  HEART:  Regular rhythm without murmurs, rubs, or gallops.  ABDOMEN:  Soft and benign with no palpable organomegaly or masses.  EXTREMITIES:  Warm without calf tenderness, cyanosis, clubbing, or edema.  SKIN:  Reveals a diffuse erythematous rash that blanches with no ulcerations or breakdown.  LABORATORY DATA:  Chest x-ray and CT scan were compared to previous x-rays in December and show areas of cavitation in an area previously of dense pneumonia with no definite air fluid level.  IMPRESSION:  Persistent infiltrates now with cavitary formation in a patient with documented colon cancer stage III.  No known metastatic disease to the liver.  It would be unusual if this represented metastatic disease.  More likely, it represents opportunistic infection especially semi-invasive aspergillosis needs to be considered along with tuberculosis and other fungal diseases.  Certainly, it is not consistent with an acute bacterial process. The fact that she is not bringing up any sputum is actually against tuberculosis as well.  Since it is so cavitary and so focal, I am very doubtful that this represents an adverse effective chemotherapy but clearly she is tolerating medication poorly.  This makes even more important, therefore, to render a specific diagnosis as soon as possible.  I agreed to proceed with  bronchoscopy with transbronchial biopsy if we can reach the cavity.  If not, we will need to go to the right upper lobe where there is similar chronic inflammation.  To access the cavity, it may be necessary to biopsy the edge of it by percutaneous technique since it is relatively peripheral and at the top of the dorsal aspect of the superior segment by both chest x-ray and CT scanning.  I discussed all the issues with the patient and her family in detail and she agreed to proceed with bronchoscopy on August 20, 2001. Dictated by:   Charlaine Dalton. Sherene Sires, M.D. [facility] Attending Physician:  Tobin Chad DD:  08/20/01 TD:  08/23/01 Job: [id number]

## 2010-10-18 NOTE — Discharge Summary (Signed)
[place]. [facility]  Patient:    [name], [name] Visit Number: [id number] MRN: [id number]          Service Type: MED Location: [phone] Attending Physician:  Tobin Chad Dictated by:   Vear Clock, M.D. Admit Date:  08/16/2001 Discharge Date: 08/25/2001   CC:         Vear Clock, M.D.  Acquanetta Belling, M.D.  Lyndee Leo. Janey Greaser, M.D.  Lacretia Leigh. Ninetta Lights, M.D.  Aliene Altes, M.D.   Discharge Summary  DISCHARGE DIAGNOSES: 1.  Tuberculosis with cavitary lung lesion. 2.  Colon cancer. 3.  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 4.  Type II diabetes. 5.  Anemia. 6.  History of cerebrovascular accident. 7.  Patent foramen ovale. 8.  Hypercoagulable state, secondary to carcinoma.  DISCHARGE MEDICATIONS: 1.  Protonix 40 mg daily. 2.  Pyridoxine 50 mg daily. 3.  Rifampin 600 mg daily. 4.  Ethambutol 1200 mg daily. 5.  Isoniazid 300 mg daily. 6.  Pyrazinamide 1500 mg daily. 7.  FeSO4 325 mg t.i.d. 8.  Coumadin 4 mg daily. 9.  Glucophage 1,000 mg b.i.d.  FOLLOW-UP: 1.  The patient is to see Dr. Darnelle Catalan within 3 weeks of discharge.  The     patient is to call to schedule an appointment. 2.  Dr. Mikel Cella with [facility] on Wednesday, September 01, 2001 at 1:55 pm. 3.  Dr. Nicholas Lose at [facility] on Tuesday, September 07, 2001 at 10:30 am.  PROCEDURES/DIAGNOSTIC STUDIES:  Chest x-ray X4 with final chest x-ray showing confluent right lower lobe infiltrate without significant interval change.  CT of the chest, abdomen, and pelvis on 08/17/2001 shows diffuse infiltration in the right lung with enlarged cavitary lesions in several nodular spaces, most likely some type of atypical infectious process such as fungal.  TB would a consideration.  This is unlikely metastatic disease.  Unremarkable CT of the abdomen.  No evidence for hepatic metastasis.  Enlarged fibroid uterus. Simple appearing  cyst on the right ovary.  No pelvic masses or lymphadenopathy.  Bronchoscopy on 08/20/2001, please dictation by Dr. Sherene Sires for more details.  In short, cavitary mass and chronic infiltrates of undetermined etiology in this immunocompromised patient.  CONSULTANTS: 1.  Infectious Disease, Dr. Ninetta Lights. 2.  Dermatology, Dr. Nicholas Lose. 3.  Oncology, Dr. Darnelle Catalan. 4.  Pulmonology, Dr. Sherene Sires.  ADMISSION HISTORY & PHYSICAL:  Please see dictated H&P for further details.  HISTORY OF PRESENT ILLNESS:  This is a 55 year old native American female with a past medical history of colon cancer, currently receiving chemotherapy who presented to the [facility] with a progressive rash secondary to receiving Tequin for a UTI.  The rash was presumed to be a drug reaction secondary to Tequin.  She was started on steroids and also had a complaint of a cough.  She had a chest x-ray which showed a cavitary lesion.  Follow-up CT showed again the cavitary lesion and the patient was eventually diagnosed with tuberculosis.  HOSPITAL COURSE: #1 -  TUBERCULOSIS:  The patient had a minimal cough throughout her hospitalization but did have significant risk factors in that she is native [place]. [place] and has had a positive PPD in the past for which she did not receive drug therapy.  The patient is immunosuppressed secondary to her chemotherapy.  Consequently, it is believed that her tuberculosis was activated by her immunosuppression.  The patient was  started on Pyrazinamide, Isoniazid, Rifampin, and Ethambutol and will receive this 4 drug treatment for the next 2 months under direct observed therapy by the [facility].  At the end of 2 months, hopefully the specificities will be back and the patient can be started on specific treatment targeting her tuberculosis and will need a total of 6 total months of therapy.  The patient may return to work on 4/14 secondary to her  TB and is expected to remain around the house as much as possible.  #2 -  COLON CANCER:  The tuberculosis is a very unfortunate turn of events for Ms. Awolowo as she will no longer be a candidate for chemotherapy. Therefore, her invasive adenocarcinoma will no longer be able to be treated. The patient will be followed through outpatient for surveillance purposes by Dr. Darnelle Catalan and is to follow-up with him in approximately 3 weeks.  #3 -  VASCULITIS VERSUS ATYPICAL DRUG REACTION:  The patient was started on a steroid upon admission and was treated with this steroid for 3 days.  She did respond well to this steroid and her petechial and papular rash on all of her extremities did resolve.  However, she did have an exacerbation after the steroids were stopped, just prior to discharge and a dermatology consult was requested.  Dermatology requested and CBC and UA prior to her discharge.  This was done.  White count was 6.5, hemoglobin 9.7, platelets 379.  Urinalysis with small bilirubin, greater than 80 ketones, moderate blood, negative nitrites, negative micro, 7-10 RBCs and rare bacteria.  The patient is to follow-up with Dr. Nicholas Lose in 7-10 days and a biopsy was obtained and sent to pathology.  However, the results of that are not yet back.  #4 -  DIABETES TYPE II:  Presumable secondary to the patients requirement for steroids as well as her inability to continue to receive her Glucophage secondary to imaging studies.  The patient did have relatively poorly controlled CBGs while she was an inpatient.  She was started on Lantis and was covered with sliding scale insulin.  Glucophage was resumed prior to  discharge.  Her previous hemoglobin A1C while on Glucophage 1,000 b.i.d. as an outpatient was in the 8.0 range.  Therefore, this is appropriate glycemic control for this patient with tuberculosis and colon cancer and we will recommend continued follow-up for this by Dr. Janey Greaser.  The patient  has a new glucometer and is instructed to check her blood glucose each morning and night and record it and bring it to Dr. Janey Greaser.  #5 -  ANEMIA:  The patient started on Ferrous sulfate tid.  #6 -  HYPERCOAGUABILITY/CVA PATENT OVALE:  The patient restarted on her Coumadin 4 mg po daily and will need follow-up for this as an outpatient as well.  DISCHARGE LABORATORY:  CBC already reported.  UA already reported.  PTT 84, PT 19.5, INR 1.9, phosphorus 2.9, magnesium 1.9, sodium 138, potassium 3.6, chloride 109, CO2 21, glucose 109, BUN 6, creatinine .5, and calcium 8.3. C-anka negative.  Urine  culture from 3/23 is negative.  Blood culture X4 negative.  Blood culture X1 was presumed contaminant.  ANA negative.  Fungal culture negative.  Bronchial washings 1+ acid fast bacilli seen.  The patient did receive 2 units of FFP prior to her bronchoscopy.  CRP 5.2.  Acid fast culture of sputum negative.  Histoplasma antigen negative.  Blood culture for AFB negative to date.  Fibrinogen on admission 565, D-Dimer 3.23.  Sed  rate 45.  DISPOSITION:  The patient was discharged to home without further incident.  CC:   [name], M.D.       Sandrea Hughs, M.D. Dictated by:   Vear Clock, M.D. Attending Physician:  Tobin Chad DD:  08/25/01 TD:  08/26/01 Job: [id number]

## 2010-10-18 NOTE — Procedures (Signed)
[place]. [facility]  Patient:    [name], [name] Visit Number: [id number] MRN: [id number]          Service Type: Attending:  Hedwig Morton. Juanda Chance, M.D. [facility] Dictated by:   Hedwig Morton. Juanda Chance, M.D. [facility]                             Procedure Report  DATE OF BIRTH:  2056/04/23  NAME OF PROCEDURE:  Flexible sigmoidoscopy.  INDICATIONS:  This 55 year old Native [place] female has been evaluated for hematochezia.  She had a full colonoscopy two days ago with findings of a lesion in the sigmoid colon which was biopsied.  It was a malignant-appearing, infiltrating, ulcerative lesion in the sigmoid colon, but the biopsies were negative for malignancy.  She was at that time in a heparin window.  She was restarted on heparin and stopped again this morning until we biopsy the lesion and then I will look at it.  ENDOSCOPE:  Fujinon single-channel video endoscope.  SEDATION:  Versed 5 mg IV and Demerol 25 mg IV.  FINDINGS:  The Fujinon single-channel video endoscope was passed under direct vision through the rectum to the sigmoid colon.  The patient was monitored by pulse oximetry.  Oxygen saturations were satisfactory.  At the level of 5 cm from the rectum was a pedunculated polyp which was multilobulated.  It was easily removed with snare and sent to pathology.  At the level of 30-35 cm from the rectum was a depressed ulcerated lesion with raised edges.  It was rather firm, having a necrotic center.  It was difficult to biopsy because it was rather slight rather than exophytic.  It bed on passage of the scope. Multiple biopsies were obtained from the center of the lesion.  The tissue broke off the base of the ulcer, indicating some fibrotic element.  The patient tolerated the procedure well.  IMPRESSION: 1. Malignant-appearing ulcer/mass of the sigmoid colon at 35 cm, status post    repeat biopsies. 2. A left colon polyp at 5 cm, status post polypectomy.  PLAN: 1. Stay  off heparin. 2. Surgical consultation for possible resection. 3. Evaluate the results of the histology. 4. Resume diet. Dictated by:   Hedwig Morton. Juanda Chance, M.D. [facility] Attending:  Hedwig Morton. Juanda Chance, M.D. [facility] DD:  04/28/01 TD:  04/28/01 Job: [id number]

## 2010-10-18 NOTE — Discharge Summary (Signed)
[name], FILE            ACCOUNT NO.:  [id number]   MEDICAL RECORD NO.:  [id number]          PATIENT TYPE:  INP   LOCATION:  1319                         FACILITY[facility]  [facility]   PHYSICIAN:  Valerie A. Felicity Coyer, MDDATE OF BIRTH:  10-11-1955   DATE OF ADMISSION:  07/22/2006  DATE OF DISCHARGE:                               DISCHARGE SUMMARY   DISCHARGE DIAGNOSIS:  1. Chest congestion, nasal congestion and cough, likely viral upper      respiratory infection; rule-out pneumonia negative.  2. Transient shortness of breath with hemoptysis prior to admission.      No recurrence.  Note remote history of tuberculosis status post      treatment.  See history and physical for details.  3. Type 2 diabetes, poorly controlled, with hemoglobin A1c of 11.1.      Well controlled in hospital on reduced dose of reported home      medications.  See details below.  4. Chronic anticoagulation secondary to history of recurrent      cerebrovascular accident, subtherapeutic INR at time of discharge      due to noncompliance.  Continue home regimen with close outpatient      followup with primary medical doctor.  5. Mild dehydration secondary to acute illness status post intravenous      fluids with resolution.  6. History of dyslipidemia, not currently on medications.  7. History of hypertension, not currently on medications, with normal      pressures.  8. 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  9. History of colon cancer status post partial resection and      chemotherapy.  10.History of remote cholecystectomy.   DISCHARGE MEDICATIONS:  1. Levaquin 750 mg once daily for 5 additional days to complete 7-day      empiric course.  2. Tessalon Perles 100 mg one b.i.d. for cough, dispense #20.  3. Coumadin 5 mg daily except 7.5 mg on Wednesday and Saturday to      continue as prior to admission with outpatient followup.  4.  Glucophage 1000 mg b.i.d.  5. Januvia 100 mg b.i.d.  6. Lantus 30 units q.h.s. only at this time but encouraged compliance      (note previously prescribed 60 mg q.h.s. but CBG is in the mid 100s      on this regimen.  7. Over-the-counter Ferritin D once daily for nasal and chest      congestion and rhinorrhea.   HOSPITAL FOLLOWUP:  Next week with primary care physician, Dr. Shellia Carwin.  The patient is to call and arrange for this appointment.  Also to  consider referral to endocrinology for better management of her diabetes  as the patient notes allergy to Humalog regular insulin and seems to  have poor understanding of her disease and necessary compliance given  hemoglobin A1c of greater than 11.   CONDITION ON DISCHARGE:  Medically improved.   HOSPITAL COURSE BY PROBLEM:  #1 - FEVER, COUGH, SHORTNESS OF BREATH AND  CONGESTION, RULE OUT PNEUMONIA.  The patient is a 55 year old Native  American woman with complicated past medical history including  uncontrolled diabetes, history of stroke with left hemiparalysis, as  well as colon cancer status post resection and chemotherapy.  She has  also had a history of TB reactivation during treatment of chemotherapy  and underwent a complete course of treatment during that time.  Over the  last 3 weeks she has been evaluated numerous times by primary care  physician for viral-type syndrome, initially with cough, then headache,  then nasal congestion.  On this particular day of admission she had had  increasing in fevers with shortness of breath and reports of hemoptysis  and therefore was referred to get evaluation at the hospital for  possible pneumonia.  She was begun on empiric Levaquin as well as  Rocephin pending these results.  Evaluation on chest x-ray showed no  active disease with stable right upper lobe scarring.  Had a normal  white count, normal hemoglobin and good O2 saturations at 94% on room  air, all likely to represent  persistence of viral syndrome with possible  component of sinusitis clinically.  Her antibiotics were narrowed to  Levaquin treatment only and maximize her symptomatic relief with the  addition of Tessalon Perles and Claritin.  She was continued with IV  hydration due to clinical mild dehydration associated with her acute  illness, and also focused on management of her uncontrolled diabetes.  After 48 hours of hospitalization with the above-listed treatment the  patient has felt symptomatically clinical 100% improved, has remained  stable and ready for discharge.  Will continue another 5 days of  Levaquin empirically to complete 1-week course, as well as the above-  listed symptomatic treatments.  Influenza A/B swab nasal was negative  and blood cultures x3 sets have shown no growth to date.  She was felt  stable for discharge home to continue rest and observation with  outpatient followup next week at primary MD.   #2 - UNCONTROLLED TYPE 2 DIABETES.  The patient reports occasional  missing pills as well as uncertainty regarding her Lantus dosing and  says that while she has been sick her sugars have been running in the  400s with her high fever.  Initially the patient was placed only on her  metformin and Januvia with sliding scale and her CBGs were in fact in  the 200s.  We resumed a moderate dose of her Lantus 20 units q.h.s. with  great improvement, her CBGs being 130s to 180s on this regimen, which  also includes sliding scale.  After discussion with the patient  regarding her home regimen she says she has not been taking the 60 units  at bedtime nor has she been taking 30 b.i.d. as previously prescribed.  I have instructed her at this time to continue only 30 units at bedtime  with her oral metformin and Januvia, and to maintain close followup,  checking her CBGs regularly.  If these run higher than 200 before meals or bedtime she should call her primary care physician for further   instructions on her Lantus but also encouraged close followup to ensure  adequate control, explaining that hemoglobin A1c of 11, given her  history of other complications with stroke, was unacceptable for her  management of diabetes in regards to health goals.  The patient agrees  to compliance and monitoring and will defer outpatient followup to  primary care physician with consideration of referral to endocrinology  if further assistance  necessary.   #3 - OTHER MEDICAL ISSUES.  The patient's other medical issues are as  listed.  Of note, her INR was subtherapeutic/undetectable at 1.1 and the  patient admits that she has not been taking her Coumadin as prescribed  due to running out.  However, on day of discharge I have discussed with  the patient who says she does have a refill on her medication and will  continue taking this as before.  She agrees to close monitoring of her  Coumadin and it has been reminded to her the possible interaction with  her antibiotics and need for close maintenance to prevent further  strokes and further outpatient followup, again, per primary MD next  week.      Valerie A. Felicity Coyer, MD  Electronically Signed     VAL/MEDQ  D:  07/24/2006  T:  07/24/2006  Job:  [id number]

## 2010-10-18 NOTE — Consult Note (Signed)
[facility]                          ENDOCRINOLOGY CONSULTATION   [name], [name]                   MRN:          [id number]  DATE:08/11/2006                            DOB:          1955-06-15    REFERRING PHYSICIAN:  Tera Mater. Clent Ridges, MD   REASON FOR REFERRAL:  Diabetes.   HISTORY OF PRESENT ILLNESS:  A 55 year old woman who reports a 5-year  history of diabetes, complicated by several CVAs.  She has been on  insulin for the past 2 years.  She has been found to be chronically  nonadherent with advice and therefore has chronically poor control.  She  has been on insulin since her time of diagnosis.  She currently takes  Lantus 30 units twice a day.  She reports her glucoses are in general  higher as the day goes on.  She describes her diet and exercise as both  poor.  Symptomatically, she has 5 years of left lower extremity weakness  with slight associated numbness of both feet.  Her left lower extremity  weakness is attributed to her CVA.   PAST MEDICAL HISTORY:  1. CVA, 2003.  2. Adenocarcinoma of the sigmoid colon.  3. Hypertension.  4. Dyslipidemia.  5. Chronic anticoagulation due to her CVA.  6. She also reports that Humalog causes her to break out.   SOCIAL HISTORY:  She is divorced.  She works in Clinical biochemist.   FAMILY HISTORY:  Positive for diabetes in her mother.  The patient  reports that she is of [place] ancestry and is originally from [place].   REVIEW OF SYSTEMS:  Denies weight gain and weight loss.  She denies  hypoglycemia.   PHYSICAL EXAMINATION:  VITAL SIGNS:  Blood pressure is 114/75, heart  rate 89, temperature 97.8, the weight is 175.  GENERAL:  No distress.  SKIN:  Not diaphoretic.  No rash.  HEENT:  No proptosis.  No periorbital swelling.  Pharynx is normal.  NECK:  Supple.  No goiter.  CHEST:  Clear to auscultation.  No respiratory distress.  CARDIOVASCULAR:  There is no edema.  Regular rate and  rhythm.  No  murmur.  Pedal pulses are intact.  There is no bruit at the carotid  arteries.  HEENT:  Feet normal color and temperature.  There is no ulcer present on  the feet.  NEUROLOGIC:  Alert and oriented.  Does not appear anxious nor depressed.  Sensation is minimally decreased on the feet.  She has 4/5 muscle  strength on the left upper extremity and the left lower extremity (her  strength is subjectively worse on the left lower extremity, however).  Muscle strength otherwise normal.   Laboratory studies forwarded by Dr. Clent Ridges:  On July 30, 2006,  hemoglobin A1c 11.1.   IMPRESSION:  1. Diabetes of uncertain type.  Based on #2, she appears to need the      simplest possible regimen.  Also, she appears to need a regimen      that gives her more insulin later in the day, when she reports her      glucoses are  higher.  2. Chronic nonadherence with therapy.  3. Slight numbness, probably due to her diabetes.  4. She has multiple cardiovascular risk factors, including her      cerebrovascular accident.  5. Question of an intolerance to Humalog.   RECOMMENDATIONS:  1. We discussed the risk of diabetes and the risk of nonadherence.  2. Change her insulin to NovoLog 70/30, 45 units q.a.m., 30 units      q.p.m.  3. Discontinue oral agents.  4. Return in 2 weeks with a record of your home glucoses.  5. Will check B12 with the next laboratory studies.  6. She requested I refer her to an orthopedic surgeon regarding her      left knee pain, and this is done at her request.     Sean A. Everardo All, MD  Electronically Signed    SAE/MedQ  DD: 08/12/2006  DT: 08/14/2006  Job #: [id number]   cc:   Jeannett Senior A. Clent Ridges, MD

## 2010-10-18 NOTE — H&P (Signed)
[name], [name] NO.:  [id number]   MEDICAL RECORD NO.:  [id number]          PATIENT TYPE:  EMS   LOCATION:  ED                           FACILITY:  [facility]   PHYSICIAN:  Tera Mater. Clent Ridges, MD     DATE OF BIRTH:  1956/05/21   DATE OF ADMISSION:  07/22/2006  DATE OF DISCHARGE:                              HISTORY & PHYSICAL   TIME OF ADMISSION:  5:00 p.m.   CHIEF COMPLAINT:  This is a 55 year old woman complaining of worsening  fever, weakness, coughing up bloody sputum, and out-of-control blood  sugars.   HISTORY OF PRESENT ILLNESS:  The patient first became sick about two  weeks ago.  She went to the emergency room and was diagnosed with  influenza.  She had been using fluids and symptomatic measures until she  became more sick over the past week.  During this time, she has had  steadily worsening fever, as high as 105 degrees, which it was at home  earlier this morning.  She is coughing up bloody sputum.  She is very  fatigued.  She has a burning, diffuse chest pain.  She is very short of  breath and has been quite nauseated.  She has vomited on one or two  occasions.  She has closely monitored her blood glucoses, and they have  been quite high, usually in the 300s to 400s in the past week.  Bowel  movements have been regular.  Urinations have not changed.  No skin  rashes have been seen.  She also complains of intermittent headaches.   Past medical history includes three strokes, the last of which was in  2003.  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  She also has type 2 diabetes mellitus.  She is currently on a  combination of insulin and oral medications.  Typically, this has been  fairly well controlled until recently.  She is status post a partial  colon resection for colon cancer and after surgery went through a full  course of chemotherapy for that.  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  She went  through a full course of triple antibiotic therapy for that, and it  seems to have been in remission since then.  She has hyperlipidemia and  hypertension.  She is status post a cholecystectomy.   ALLERGIES:  MORPHINE, IBUPROFEN, AVANDIA, SULFA, VICODIN, and GLUCOTROL.   CURRENT MEDICATIONS:  1. Coumadin 5 mg per day.  2. Aspirin 81 mg per day.  3. Multivitamin daily.  4. Metformin 1000 mg b.i.d.  5. Lantus 60 units nightly.  6. Jenuvia 100 mg each day.   HABITS:  She does not use tobacco or alcohol.   SOCIAL HISTORY:  She is divorced.  She works as a Corporate investment banker.   FAMILY HISTORY:  Remarkable for alcoholism, arthritis, and diabetes.   PHYSICAL EXAMINATION:  VITAL SIGNS:  Weight 171 pounds.  Temperature  101.6 degrees, pulse 72 and regular, BP 138/92, respirations 20 and  slightly labored.  Room air oxygenation saturation is 97%.  GENERAL:  She is quite ill and quite weak.  SKIN:  Warm and clammy.  HEENT:  Eyes are grossly clear.  She wears glasses.  Ears are clear.  Pharynx clear.  NECK:  Supple without lymphadenopathy or masses.  LUNGS:  Diffuse rhonchi and soft wheezes.  She does have rales in both  bases posteriorly.  CARDIAC:  Rate and rhythm are regular without murmurs, rubs, gallops.  Distal pulses are full.  ABDOMEN:  Soft.  Normal bowel sounds.  Nontender.  No masses.  EXTREMITIES:  No clubbing, cyanosis or edema.  NEUROLOGIC:  Grossly intact except for her chronic weakness in the left  arm and left leg.   ASSESSMENT/PLAN:  1. Probable pneumonia secondary to influenza:  She will be admitted      for laboratories, including CBC and blood cultures.  A chest x-ray,      PA and lateral, will be obtained.  Will begin Rocephin      intravenously as well as Levaquin orally.  2. Dehydration:  We will check her electrolytes status and begin      rehydration with IV fluids.  3. Poorly controlled type 2 diabetes mellitus.   Will check fingerstick      glucoses every 6 hours and begin an insulin sliding scale.  4. Status post strokes:  Will check a PT/PTT, INR, and adjust her      Coumadin accordingly.      Tera Mater. Clent Ridges, MD  Electronically Signed     SAF/MEDQ  D:  07/22/2006  T:  07/22/2006  Job:  [id number]

## 2010-10-18 NOTE — H&P (Signed)
[place]. [facility]  Patient:    [name], [name] Visit Number: [id number] MRN: [id number]          Service Type: OBV Location: 1800 1823 01 Attending Physician:  Ilene Qua Dictated by:   Raynelle Jan, M.D. Admit Date:  08/16/2001                           History and Physical  CHIEF COMPLAINT: Rash.  HISTORY OF PRESENT ILLNESS: [name] is a 55 year old female, with a past medical history of colon cancer, who is currently receiving chemotherapy.  She presents to family practice today secondary to a progressive rash x4 days duration.  The rash began Friday on the ankles and spread superiorly to involve the bilateral lower extremities and lower trunk.  Accompanied with the rash are subjective fevers and chills, as well as joint pains in the ankles and worsening pedal edema.  She had an upper respiratory infection approximately two weeks ago and has had a persistent cough, now with mild dyspnea on exertion.  She notes persistent generalized weakness.  She was also recently seen on August 03, 2001 for dysuria and diagnosed with a staph UTI. She was given seven days of Tequin; however, she finished the Tequin the day the rash began, and continues to have some dysuria.  REVIEW OF SYSTEMS: Per the HPI.  In addition, she notes some vaginal itching.  PAST MEDICAL HISTORY:  1. Significant for colon cancer.  2. Diabetes mellitus, type 2.  3. Cerebrovascular accident.  MEDICATIONS:  1. Coumadin.  2. Iron.  3. Glucophage.  4. Chemotherapy agent.  ALLERGIES: IBUPROFEN causes respiratory compromise.  SOCIAL HISTORY: She works at [employer].  Is separated, with four sons.  Denies alcohol, tobacco, or drug use.  FAMILY HISTORY: Significant for colon cancer and diabetes mellitus in her mother.  PHYSICAL EXAMINATION:  VITAL SIGNS: Pulse 104, blood pressure 130/80.  Saturation 95% on room air. Orthostatics were negative.   TEMP 99.1 degrees.  GENERAL:  This is a middle-aged Native American female, in moderate distress.  HEENT: PERRL.  No conjunctival injection.  Oropharynx shows moist mucous membranes, without petechiae noted.  NECK: Supple without JVD or lymphadenopathy.  CARDIOVASCULAR: She is mildly tachycardic with a regular rate and rhythm.  No murmurs.  LUNGS: Significant for bilateral wheezes and rales throughout, with mild increased work of breathing.  ABDOMEN: Soft, nontender, nondistended.  Positive bowel sounds.  No hepatosplenomegaly or masses.  BACK: Without CVA tenderness.  EXTREMITIES: She has 2+ pedal edema to the mid calves.  The ankle joints are swollen and tender to touch.  SKIN: The patient has petechiae and purpuric rash over the lower half of the trunk and bilateral lower extremities.  No other rash is noted.  NEUROLOGIC: Intact.  LABORATORY DATA: CBC shows a WBC of 5.6 with 73% segs, hemoglobin 11.2; platelets 253,000.  PT is 19.9, INR is 2.81.  Urinalysis shows greater than 1000 glucose, moderate blood; greater than 20 rbc/hpf, 4-8 wbc/hpf; 2+ bacteria; trace yeast.  Wet prep shows moderate hyphii.  Blood and urine cultures are pending.  ASSESSMENT/PLAN:  1. This is a 55 year old female, with what appears to be a vasculitic rash of     uncertain etiology.  Likely immunologic in nature due to either the Tequin     or possibly her recent infections.  Due to the patients immunocompromised     state, and recent Staphylococcus bacteriuria  cannot rule out early sepsis.     Furthermore, concerned that vasculitis process may be involving the     kidneys and lungs.  Will admit for observation.  Will check CBC and     electrolytes in addition to sedimentation rate and DIC panel.  Will hold     on antibiotics unless the patient becomes febrile or unless an infectious     process manifests during work-up.  Would consider addition of steroids to     regimen once infection has been  ruled out.  2. Diabetes.  Will hold Glucophage and place on sliding-scale insulin.  3. Cerebrovascular accident.  Will currently continue patient cautiously on     her Coumadin as the patient does have patent foramen ovale, and has     malignant state and therefore is at risk for coagulopathy.Dictated by: Raynelle Jan, M.D. Attending Physician:  Ilene Qua DD:  08/16/01 TD:  08/16/01 Job: [id number]

## 2010-10-18 NOTE — H&P (Signed)
[place]. [facility]  Patient:    [name], [name]                     MRN: [id number] Adm. Date:  [id number] Attending:  Carmelina Peal CC:         [facility]   History and Physical  HISTORY OF PRESENT ILLNESS:  The patient is a 55 year old, right-handed female born on Dec 20, 1955, with a history of diabetes mellitus for a number of years, followed by the [facility].  This patient comes in the hospital today after noting some dizziness last evening.  The patient awoke this morning initially feeling fairly good, but then noted the onset of problems with some numbness involving predominantly the left arm, but also the left leg as well.  The patient denied any visual field changes, but felt as if the left side were slightly weak.  She was having difficulty with ambulating, tending to fall.  The patient notes slight right frontal headache and came to the emergency room for an evaluation.  Speech was somewhat slurred.  The patient had no blackout episodes, and has stayed about the same since she has been in the emergency room.  The patient noted the onset of the problem at around 8 a.m. today.  A CT scan of the head has been done and appears to be unremarkable except for some questionable old ischemic changes in the brainstem area.  The patient has been admitted to [facility] under the care of Dr. Genene Churn. Love around February 13, 2000.  The patient had a left brain stroke at that time with a right-sided hemiparesis, and a workup showed evidence of small vessel ischemic changes, and an MRI at that point showed evidence of infarction in the left pons, genu of the left internal capsule, and globus pallidus.  the patient was treated with Plavix, and discharged from the hospital at that point.  An MR angiogram showed some intracranial atherosclerotic-type changes.  A carotid Doppler study was unremarkable.   A 2-D echocardiogram was unremarkable during that admission. The patient has also noted today that her left arm seems to be moving on its own without her voluntary control.  PAST MEDICAL HISTORY: 1. History of cerebrovascular disease, as above. 2. New onset of left-sided numbness and weakness. 3. History of diabetes mellitus. 4. History of obesity. 5. History of gallbladder resection in the past.  CURRENT MEDICATIONS: 1. Plavix 75 mg q.d. 2. Glucophage 1000 mg q.a.m.  ALLERGIES:  MOTRIN.  SOCIAL HISTORY:  Does not smoke or drink.  This patient lives in the [place] area.  Is married.  Has four sons who are alive and well.  FAMILY HISTORY:  Notable that the mother died with colon cancer.  Father died with cirrhosis of the liver.  The patient has two brothers and a sister, all in good health.  REVIEW OF SYSTEMS:  Notable for no recent fevers or chills.  The patient denies any chest pain, shortness of breath, cough, nausea, vomiting.  Denies any visual field changes.  The patient does note numbness of the left arm predominantly.  PHYSICAL EXAMINATION:  VITAL SIGNS:  Blood pressure 107/68, heart rate 78, respirations 18, temperature afebrile.  GENERAL:  This patient is a moderately-obese female, who is alert and cooperative at the time of the examination.  HEENT:  Head is atraumatic.  Eyes:  Pupils equal, round, reactive to light. Discs are flat  bilaterally.  NECK:  Supple.  No carotid bruits noted.  LUNGS:  Respiratory examination is clear to auscultation and percussion.  CARDIOVASCULAR:  A regular rate and rhythm.  No obvious murmurs or rubs noted.  EXTREMITIES:  Without significant edema.  NEUROLOGIC:  Cranial nerves as above.  Facial symmetry appears to be present. The patient notes good symmetry to pinprick sensation in the face.  Visual fields are full to double simultaneous stimulation.  Speech is minimally dysarthric.  Extraocular movements are full.  The  patient has no evidence of aphasia.  Motor testing reveals 4/5 strength in the left arm and left leg. The patient has decreased pinprick on the left arm, as compared to the right. Relatively symmetric in the lower extremities to pinprick.  Vibratory sensation, however, is depressed at the left foot, as compared to the right. It is essentially absent in the left hand, as compared to the right.  The patient has ataxia 2-3+.  Finger-to-nose-to-finger left arm, difficult to perform with the left leg.  The patient was not ambulated.  Deep tendon reflexes appeared relative symmetric.  Definite Babinski noted on the left. Equivocal on the right.  LABORATORY DATA:  Noted before was white count 5.7, hemoglobin 13.6, hematocrit 40.7, platelets 253.  Coags revealed an INR of 1.1.  Other labs are pending.  Chest x-ray is pending.  Electrocardiogram is not available to me at this time.  IMPRESSION: 1. History of cerebrovascular disease with recent recurrence involving    the right brain and left body. 2. History of diabetes mellitus. 3. Obesity.  This patient is being admitted with another stroke-type event, the last one in September 2001.  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 Will admit the patient for further evaluation.  PLAN: 1. Admit the patient to [facility] Stroke Team. 2. IV heparin. 3. MRI scan of the brain. 4. MR angiogram. 5. Physical, occupational, and speech therapy. 6. Hypercoagulable state workup. 7. Given this patients age, may also consider the possibility of a    cerebral angiogram during this admission. DD:  07/08/00 TD:  07/08/00 Job: [id number]

## 2010-10-18 NOTE — Op Note (Signed)
[name], [name] NO.:  [id number]   MEDICAL RECORD NO.:  [id number]          PATIENT TYPE:  AMB   LOCATION:  SDC                           FACILITY:  [facility]   PHYSICIAN:  Duke Salvia. Marcelle Overlie, M.D.DATE OF BIRTH:  1955-10-17   DATE OF PROCEDURE:  11/28/2004  DATE OF DISCHARGE:                                 OPERATIVE REPORT   PREOPERATIVE DIAGNOSES:  Leiomyoma, pelvic pain, dysmenorrhea, menorrhagia.   POSTOPERATIVE DIAGNOSES:  Leiomyoma, pelvic pain, dysmenorrhea, menorrhagia.   PROCEDURE:  Total vaginal hysterectomy.   SURGEON:  Dr. Marcelle Overlie.   ASSISTANT:  Freddy Finner, M.D.   ANESTHESIA:  General endotracheal.   COMPLICATIONS:  None.   DRAINS:  Foley catheter.   ESTIMATED BLOOD LOSS:  150.   SPECIMENS REMOVED:  Uterus.   PROCEDURE AND FINDINGS:  The patient was taken to the operating room after  an adequate level of general endotracheal anesthesia was obtained.  With the  patient's legs in the stirrups, the peroneum and vagina were prepped and  draped with Betadine.  The bladder was drained.  Examination under  anesthesia was carried out.  The uterus was upper limits of normal size,  mobile and adnexa negative.  A weighted speculum was positioned.  The cervix  grasped with a tenaculum.  The cervical vaginal mucosa was incised.  The  posterior culdotomy performed without difficulty.  Using the LigaSure, the  left uterosacral ligament was coagulated and divided.  The same on the  opposite side.  The bladder flap was advanced superiorly until the  peritoneum could be identified.  This was entered sharply and a retractor  used to gently elevate the bladder out of the field.  In sequential manner  staying close to the uterus, the cardinal ligament and uterine vasculature  pedicles were clamped, coagulated and divided with the LigaSure.  The fundus  of the uterus was then delivered posteriorly and the remaining utero-ovarian  pedicles were clamped and  divided, first with a free tie, followed by a  suture ligature of 0 Vicryl.  The posterior cuff was then closed from 3 to 9  o'clock with a running 2-0 Vicryl suture in locked fashion.  The ovaries  were small and were fairly well suspended, were not removed since they  appeared to be normal.  These pedicles were cut free and were hemostatic.  Prior to  closure, sponge, needle and instruments were reported as correct x2.  The  vaginal mucosa closed with a right to left interrupted 2-0 Monocryl  interrupted suture.  A Foley catheter was positioned draining clear urine.  She tolerated this well, went to the recovery room in good condition.       RMH/MEDQ  D:  11/28/2004  T:  11/28/2004  Job:  [id number]

## 2010-10-18 NOTE — Op Note (Signed)
[place]  Patient:    [name], [name] Visit Number: [id number] MRN: [id number]          Service Type: MED Location: 5000 5037 01 Attending Physician:  Sanjuana Letters Dictated by:   Adolph Pollack, M.D. Proc. Date: 04/30/01 Admit Date:  04/21/2001   CC:         Hedwig Morton. Juanda Chance, M.D. [facility]   Operative Report  PREOPERATIVE DIAGNOSIS:  Sigmoid colon cancer.  POSTOPERATIVE DIAGNOSIS:  Sigmoid colon cancer.  PROCEDURE:  Sigmoid colectomy.  SURGEON:  Adolph Pollack, M.D.  ASSISTANT:  Zigmund Daniel, M.D.  ANESTHESIA:  General.  INDICATION:  This is a 55 year old female who has been having intermittent rectal bleeding for many years but more severe here lately.  She is on chronic Coumadin for a patent foramen ovale and multiple pontine strokes.  She was noted to have lesions in her colon in the sigmoid region.  Initial biopsies were inconclusive; however, biopsy of one lesion at 37 cm was positive for cancer.  The entire lesion was unable to be removed.  She is currently off heparin and has undergone a bowel prep, and now she is brought to the operating room for elective colectomy.  DESCRIPTION OF PROCEDURE:  She was placed supine on the operating table, where a general anesthetic was administered.  The abdomen and perineum were sterilely prepped and draped after she was placed in the lithotomy position and a Foley catheter was inserted.  A lower midline incision was made, incising the skin and subcutaneous tissue down to the level of the fascia. The fascia was incised, as was the peritoneum.  The peritoneal cavity was entered under direct vision.  A Balfour retractor was used for visualization. Preoperatively she had had a CT scan, which did not demonstrate any liver lesions.  The sigmoid colon was quite redundant.  I was able to palpate the lesion and mark it with a suture.  The lateral attachments of the sigmoid  colon were incised with the cautery and the sigmoid colon was medialized.  I then divided the sigmoid colon approximately 10-15 proximal to the lesion.  I then divided the rectosigmoid junction, which was approximately 5-6 cm distal to the lesion.  The mesentery in between, including the inferior mesenteric artery, was divided and ligated.  I took the specimen to the back table, opened it up, and noted the lesion.  I then marked the distal end of the specimen with a suture.  I changed my gloves.  Next, because of some redundancy in some of the proximal sigmoid and descending colon, I was able to place the rectosigmoid junction in a side-to-side fashion with the distal descending colon, and a stapled anastomosis was performed.  I closed the remaining enterotomy with a linear noncutting stapler.  The anastomosis was patent, viable, and under no tension. I then reapproximated the mesentery to close the mesenteric defect with interrupted sutures.  All gloves were changed at this time.  The pelvis was irrigated.  No bleeding was noted.  The sponge count was correct at this time.  The omentum was placed back over the viscera and the fascia closed with a running #1 PDS suture.  The subcutaneous tissue was irrigated and the skin was closed with staples.  Perioperatively she has been covered with ampicillin and gentamicin.  She will also have her heparin started tomorrow at 6 a.m.  She did tolerate the procedure well without any apparent complications and was  taken to the recovery room in satisfactory condition. Dictated by:   Adolph Pollack, M.D. Attending Physician:  Sanjuana Letters DD:  04/30/01 TD:  04/30/01 Job: [id number]

## 2010-10-18 NOTE — H&P (Signed)
[name], COLAN NO.:  [id number]   MEDICAL RECORD NO.:  [id number]          PATIENT TYPE:  AMB   LOCATION:                                FACILITY:  [facility]   PHYSICIAN:  Duke Salvia. Marcelle Overlie, M.D.    DATE OF BIRTH:   DATE OF ADMISSION:  11/28/2004  DATE OF DISCHARGE:                                HISTORY & PHYSICAL   CHIEF COMPLAINT:  Symptomatic leiomyoma.   HISTORY OF PRESENT ILLNESS:  A 55 year old American [place] female G5, P4  seen originally by our nurse practitioner, Julio Sicks April of this year  complaining of menorrhagia.  She had mild anemia at that time with a  hemoglobin of 9.4.  Ultrasound in our office demonstrated a fibroid 4.2 x  3.7 x 3.4 in the anterior wall of the uterus.  There was no fluid in the cul-  de-sac.  Ovaries were unremarkable.  She presents now for [facility].  This  procedure including risks of bleeding, infection, transfusion, adjacent  organ injury, the possible need for open or additional surgery all reviewed  with her which she understands and accepts.   Her medical doctor is Dr. Clent Ridges who has recommended preoperative that we stop  her Coumadin one week prior to surgery and restart it postoperative either  with Lovenox or Lovenox/Coumadin combination.  She has been on Coumadin for  history of stroke x2.  Other medical illnesses include diabetes currently on  combination of insulin and Glucophage.   PAST MEDICAL HISTORY:   ALLERGIES:  IBUPROFEN, MORPHINE, SULFA, and MYCIN ANTIBIOTICS.   CURRENT MEDICATIONS:  1.  Glucophage.  2.  Lente insulin.  3.  Coumadin 5 mg on an alternating schedule.  4.  Iron.   PAST OBSTETRICAL HISTORY:  Four vaginal deliveries at term without  complication.   PAST SURGICAL HISTORY:  1.  Colon resection in 2003 for colon cancer.  2.  Cholecystectomy.   FAMILY HISTORY:  Significant for a father who had an MI and mother who had  diabetes.   PHYSICAL EXAMINATION:  VITAL SIGNS:  Temperature 98.2,  blood pressure 96/60.  HEENT:  Unremarkable.  NECK:  Supple without masses.  LUNGS:  Clear.  CARDIOVASCULAR:  Regular rate and rhythm without murmurs, rubs, or gallops  noted.  BREASTS:  Without masses.  ABDOMEN:  Soft, flat, nontender.  PELVIC:  Uterus is mid to posterior, slightly enlarged, but mobile.  Adnexa  negative.  EXTREMITIES:  Unremarkable.  NEUROLOGIC:  Unremarkable.   IMPRESSION:  Symptomatic leiomyoma with menorrhagia and anemia.   PLAN:  TVH.  Procedure and risks reviewed as above.  Will check her coag  profile preoperative and restart Lovenox or Coumadin postoperative with Dr.  Claris Che postoperative consultation.       RMH/MEDQ  D:  11/26/2004  T:  11/26/2004  Job:  [id number]

## 2010-10-18 NOTE — Consult Note (Signed)
[place]. [facility]  Patient:    [name], [name] Visit Number: [id number] MRN: [id number]          Service Type: MED Location: [id number] Attending Physician:  Sanjuana Letters Dictated by:   Lorette Ang, N.P. Proc. Date: 05/04/01 Admit Date:  04/21/2001   CC:         Adolph Pollack, M.D.  Hedwig Morton. Juanda Chance, M.D. [facility]   Consultation Report  DATE OF BIRTH: May 06, 1956  REASON FOR CONSULTATION: Newly diagnosed colon cancer.  REFERRING PHYSICIAN: Emelda Fear, M.D.  HISTORY OF PRESENT ILLNESS: [name] is a 55 year old woman, with a history of CVA (September 2001, February 2002), patent foramen ovale, and diabetes mellitus, who was admitted on April 21, 2001 with painless rectal bleeding. She underwent a colonoscopy on April 26, 2001 by Dr. Lina Sar, with findings of two ulcerated colon lesions, one at 25 cm and the other at 35 cm. Pathology [phone]) favored active Crohns disease with no dysplasia identified.  A repeat flexible sigmoidoscopy was done on April 28, 2001 with repeat biopsy of the ulcer at 35 cm, with pathology ([id number]) confirming invasive colonic adenocarcinoma.  The patient was evaluated by surgery and underwent a sigmoid colectomy by Dr. Avel Peace on April 30, 2001.  The final pathology report is pending.  Abdominal/pelvic CTs were without significant abnormalities.  An oncology consultation was requested for further evaluation of this patient with newly diagnosed colon cancer.  PAST MEDICAL HISTORY:  1. CVA (September 2001, February 2002).  2. Patent foramen ovale.  3. Diabetes mellitus, type 2.  4. Status post cholecystectomy in 1982.  CURRENT MEDICATIONS:  1. Altace 2.5 mg q.d.  2. Folic acid 1 mg q.d.  3. Procrit 40,000 units weekly.  4. Ferrous sulfate 325 mg t.i.d.  5. Guaifenesin 1200 mg b.i.d.  6. Pepcid 20 mg IV q.12h.  7. Heparin drip.  8. Sliding-scale  insulin.  ALLERGIES: IBUPROFEN.  FAMILY HISTORY: Mother deceased at age 76 with colon cancer.  Father deceased at age 57 with cirrhosis.  Sister, age 27, is healthy.  A brother, age 37, is healthy.  SOCIAL HISTORY: [name]  She is separated.  She has four sons (ages 54, 59, 53, and nine), all reported to be healthy.  She is a [place] and is originally from [place].  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  HEALTH MAINTENANCE: Primary care Floyd Lusignan is Dr. Janey Greaser at the outpatient clinic.  Her emergency contact person is her sister, Noralee Chars (phone number [phone]).  The patient has never had a mammogram.  She thinks that her last Pap smear/pelvic was approximately one year ago.  She reports that her cholesterol is normal.  She does not receive the flu vaccine or the Pneumovax. She denies any history of EtOH or tobacco use.  She does not have a Living Will or health care power of attorney.  REVIEW OF SYSTEMS: The patient reports weight loss over the past year.  She recently has been experiencing some anorexia.  She denies any fever or sweats. She denies any pain.  She has had no unusual headaches of vision changes.  She denies any mouth sores or throat pain.  She has had no shortness of breath or cough.  She denies any chest pain.  She has had no lower extremity edema.  She denies any change in her bowel habits.  She does report rectal bleeding over the past year, which worsened the day of admission.  She denies any melena. She has had no abdominal pain.  She denies any hematuria or dysuria.  She has had no falls or balance problems.  She does report some left ankle weakness since her second stroke.  She reports that she has had a rash since being admitted.  PHYSICAL EXAMINATION:  VITAL SIGNS: Temperature 99.4 degrees, heart rate 101, respirations 18, blood pressure 95/59.  Oxygen saturation  90% on room air.  GENERAL: Pleasant, Native American woman in no acute distress.  HEENT: Normocephalic, atraumatic.  PERRL.  EOMI.  Sclerae anicteric.  Tongue dry.  Mouth otherwise clear.  LYMPHATICS: No palpable cervical, supraclavicular, axillary, or inguinal lymph nodes.  CHEST: Rhonchi throughout lung fields bilaterally.  CARDIOVASCULAR: Regular rate and rhythm.  ABDOMEN: Midline incision, intact with staples.  EXTREMITIES: No clubbing, cyanosis, or edema.  NEUROLOGIC: Alert and oriented x 3.  Follows commands.  Slight weakness on left plantar flexion.  LABORATORY DATA: Hemoglobin 9.2, WBC 7.5, platelets 321,000; MCV 62.7.  Sodium 134, potassium 3.6, BUN less than 2, creatinine 0.6, glucose 169.  Calcium 7.8.  Ferritin 22, iron 12, TIBC 306, per cent saturation 4; B-12 208. Preoperative CEA 2.7.  Admission hemoglobin 10.5, WBC 7.9, platelets 339,000; MCV 63.  Sodium 139, potassium 3.3, BUN 14, creatinine 0.7, glucose 177.  Total bilirubin 0.6, alkaline phosphatase 79.  SGOT 19, SGPT 12.  Total protein 2.9, calcium 8.5.  RADIOLOGY:  1. CT of the abdomen, normal.  2. CT of the pelvis, no significant abnormalities.  Mildly prominent     uterine contour.  IMPRESSION/PLAN: [name] is a 55 year old [place] woman, admitted with painless hematochezia, found to have sigmoid colon lesions, with biopsy confirming invasive adenocarcinoma.  She is status post sigmoid colectomy on April 30, 2001, with final pathology pending.  She does have a positive family history of colon cancer.  CT of the abdomen and pelvis were negative for evidence of metastatic disease.  Preoperative CEA 3.1.  The patient seems to have a poor understanding of the current situation.  Dr. Darnelle Catalan will meet with the patient and her family on Thursday morning to  discuss the diagnosis, prognosis, and any needed treatment.  Unless she is a B1, she will need adjuvant chemotherapy and  will benefit from port placement.  If she has not had a chest x-ray in the past two months, would also recommend obtaining this.  The patients brother and sister need to be made aware of the need for colonoscopy.  The patient was seen and examined by Dr. Darnelle Catalan. Dictated by:   Lorette Ang, N.P. Attending Physician:  Sanjuana Letters DD:  05/05/01 TD:  05/05/01 Job: [id number]

## 2010-10-18 NOTE — Discharge Summary (Signed)
[place]. [facility]  Patient:    [name], [name] Visit Number: [id number] MRN: [id number]          Service Type: MED Location: 5000 [id number] Attending Physician:  Sanjuana Letters Dictated by:   Emelda Fear, M.D. Admit Date:  04/21/2001 Discharge Date: 05/09/2001   CC:         Lyndee Leo. Janey Greaser, M.D., [facility]             Adolph Pollack, M.D., general surgery             Valentino Hue. Magrinat, M.D., hematology/oncology             Hedwig Morton. Juanda Chance, M.D. [facility], gastroenterology                           Discharge Summary  DATE OF BIRTH:  10-23-1955.  CONSULTS: 1. Gastroenterology. 2. General surgery. 3. Hematology/oncology.  PROCEDURES: 1. Colonoscopy x2. 2. Sigmoid colectomy with end colostomy. 3. Port-A-Cath placement.  DISCHARGE DIAGNOSES: 1. 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 2. Anemia secondary to acute blood loss and iron deficiency. 3. Diabetes mellitus type 2. 4. Patent foramen ovale. 5. History of cerebrovascular accidents x2 requiring lifelong Coumadin    therapy. 6. Postoperative pneumonia versus atelectasis. 7. Allergic reaction with pyritic maculopapular rash secondary to morphine    therapy. 8. Diarrhea. 9. Status post Port-A-Cath placement.  DISCHARGE MEDICATIONS:  1. Iron sulfate 325 mg p.o. t.i.d.  2. Colace 200 mg p.o. b.i.d.  3. Folvite one p.o. q.d.  4. Altace 2.5 mg p.o. q.d.  5. Coumadin 5 mg p.o. q.d.  6. Lovenox 80 mg subcu b.i.d. x2 days.  7. Phenergan 12.5 mg p.o. q.6h. p.r.n. nausea.  8. Glucophage 1000 mg p.o. b.i.d.  9. Aspirin 81 mg p.o. q.d. 10. Celebrex 100 mg p.o. b.i.d.  HISTORY OF PRESENT ILLNESS:  [name] is a 55 year old Native American woman presenting secondary to bright red blood per rectum.  Patient with a history of colonic polyps that were not followed up in the past suspicious for colon cancer.   GI consult was obtained and the patient underwent colonoscopy which revealed mucosal colitis with granuloma tissue and fibrinous exudate with abscesses suggestive of Crohns disease.  Due to high suspicion of malignancy, gastroenterology performed a second colonoscopy for additional biopsies. Repeat biopsies were consistent with sigmoid colonic adenocarcinoma. Subsequently, general surgery was consulted for resection of malignancy in sigmoid colon.  The patient underwent sigmoid colectomy with end colostomy. With final diagnoses, a tissue resection, the patient was diagnosed with stage III adenocarcinoma.  Hematology/oncology consult was obtained secondary to malignancy.  Per recommendations of heme/onc, the patient underwent Port-A-Cath placement, chemotherapy initiation in the upcoming two weeks upon discharge.  The patient was discharged to home in stable condition.  The patient will follow up in two days for evaluation of PT and INR at the family practice center.  Chemotherapy will be initiated on May 19, 2001.  PROBLEM LIST:  #1 - STAGE III COLONIC ADENOCARCINOMA:  The patient is status post sigmoid colectomy with end colostomy. The patient underwent two colonoscopies as well as surgical resection of lesion.  The patient will initiate chemotherapy two weeks after discharge.  #2 - ANEMIA SECONDARY TO ACUTE BLOOD LOSS AND IRON DEFICIENCY:  The patient presented with bright red blood per rectum.  The patient  was Hemoccult positive on presentation.  Iron studies were performed during her admission which revealed iron deficiency.  The patient will be discharged on iron therapy for the upcoming three months. The patient did not require blood transfusions during her admission.  Discharge hemoglobin was 8.4.  #3 -  DIABETES MELLITUS TYPE 2:  The patient was discharged on glucophage therapy.  CBGs remained stable throughout her admission.  #4 - PATENT FORAMEN OVALE:  Remained stable  throughout admission.  The patient was continued on Coumadin therapy as appropriate.  #5 - HISTORY OF CEREBROVASCULAR ACCIDENTS X2:  The patient was continued on Coumadin therapy and aspirin as appropriate.  The patient is a lifelong Coumadin candidate secondary to patent foramen ovale as well as recurrent CVAs.  #6 - POSTOPERATIVE PNEUMONIA VERSUS ATELECTASIS:  On postoperative day #2, the patient started complaining of cough with sputum production. The patient spiked fever during admission.  The patient was initiated on antibiotic therapy of Maxipime for total of five days of therapy.  The patient was afebrile on discharge and was not discharged home on any antibiotic therapy.  #7 - ALLERGIC REACTION WITH MACULOPAPULAR RASH SECONDARY TO MORPHINE THERAPY: Recommended to the patient that she no longer take morphine or other narcotic therapy secondary to allergic reaction. The patient was in agreement with this.  #8 - DIARRHEA:  The patient developed diarrhea last two days prior to discharge.  Emphasized to the patient that diarrhea is common status post GI surgery.  C. diff. and studies were pending upon discharge. The patient recommended to use Imodium p.r.n. for diarrhea.  #9 - STATUS POST PORT-A-CATH PLACEMENT:  Chemotherapy will be initiated two weeks after discharge approximately May 19, 2001, by Valentino Hue. Magrinat, M.D.  DISCHARGE LABS:  WBC 6.4, hemoglobin 8.4, hematocrit 26.1, platelets 300.  PT 16.8, INR 1.5.  C. difficile pending.  Sodium 142, potassium 3.2, chloride 111, CO2 25, BUN 5, creatinine 0.7, glucose 157, calcium 7.9.  Folate 919. MCV low at 62. Total bilirubin 0.6, alkaline phosphatase 79, SGOT 19, SGPT 12, total protein 6.6, albumin 2.9.  Ferritin 22, total iron binding capacity 30%, percent saturation 4, vitamin B12 208.  Colonic polyp biopsies, invasive colonic adenocarcinoma.  Chest x-ray revealed right middle lobe and right upper lobe  infiltrate consistent with postoperative atelectasis versus pneumonia.   DISCHARGE INSTRUCTIONS: 1. Activity:  No heavy lifting for the next four to six weeks. 2. Diet:  Low salt, low sugar diet. 3. Wound care:  Keep wound clean and dry.  Do not allow water to run over    incision yet.  No washing. 4. Special instructions:  The patient is to call the family practice office    for development of fever, vomiting, or other concerns. 5. Follow-up:  The patient has an appointment at the [facility] on Monday, May 10, 2001, at 8:30 for blood work. 6. The patient has an appointment with [facility] on    Wednesday, May 12, 2001, at 2:10, with her primary care physician,    Lyndee Leo. Janey Greaser, M.D. Dictated by:   Emelda Fear, M.D. Attending Physician:  Sanjuana Letters DD:  06/18/01 TD:  06/21/01 Job: [id number]

## 2010-10-18 NOTE — Discharge Summary (Signed)
[place]. [facility]  Patient:    [name], GAUGH                     MRN: [id number] Adm. Date:  [id number] Disc. Date: [id number] Attending:  Erich Montane CC:         Family Practice [facility] at [facility]   Discharge Summary  DATE OF BIRTH:  23-Jun-1955  This was the second [facility] admission for this 55 year old Native American married female from [place] who is right handed.  She was admitted from the emergency room for evaluation of right-sided weakness and nausea and vomiting.  HISTORY OF PRESENT ILLNESS:  This patient has a one year history of diabetes mellitus for which she states she has been admitted to [facility] to the [facility] in the past, and has been treated with oral hypoglycemics.  She has had capillary blood glucoses running in the 240 range, and last evening she noted a frontal headache.  The evening prior to admission she had the onset of frontal headache with dizziness without a symptom of vertigo, and she began having some nausea.  This occurred about 9 p.m.  There was no associated chest pain or palpitations.  The morning of admission she noted nausea and vomiting.  She noted right-sided weakness and numbness, and came to the [facility] Emergency Room.  She had no associated head or neck injury, drug abuse, or hypertension.  She does have a history of diabetes.  She has never had any heart disease or cigarette use.  She does not take aspirin.  ALLERGIES:  She has been allergic to IBUPROFEN with tongue swelling.  PAST MEDICAL HISTORY: 1. Type 2 diabetes mellitus. 2. Cholecystectomy.  CURRENT MEDICATIONS:  Glucophage 500 mg p.o. b.i.d.  HABITS:  She does not smoke cigarettes or drink alcohol.  The rest of her details of her family history have been dictated elsewhere.  PHYSICAL EXAMINATION:  GENERAL:  A well-developed female with nausea and vomiting laying on her  left side, appearing ill.  VITAL SIGNS:  Her blood pressure laying in the right arm is 110/60, left arm was 120/60, heart rate was 65, there were no bruits.  MENTAL STATUS:  She was alert and oriented x 3.  There was no aphagia.  HEENT:  Visual fields were full.  She had some nystagmus of gaze looking to the right.  Pupils reacted from 4 to 3 bilaterally.  There was no Horners syndrome.  Pinprick was decreased in face, there was no seventh nerve palsy. Tongue was benign.  The uvula was benign.  Gags were present. Sternocleidomastoid and trapezius testing were normal.  She had decreased right shoulder shrug.  NEUROLOGIC:  Her motor examination revealed 3/5 strength proximally in the right arm.  She could open and close her fists, but could not touch any of the fingers to her thumb in the right hand.  She had 3/5 strength in her right leg.  She had decreased pinprick in the right face, arm, and leg, and deep tendon reflexes were 2+ with absent ankle jerks.  Right plantar response was upgoing, left plantar response was downgoing.  The rest of her general examination was unremarkable.  LABORATORY DATA:  Revealed a CT scan of the brain which was done without contrast and is showing some subtle intercranial abnormalities that were thought to represent small-vessel ischemic changes.  There was no evidence of a subarachnoid hemorrhage or mass  lesion present.  Her MRI study of the brain without contrast enhancement showed evidence of small acute nonhemorrhagic infarcts involving the left pons, the genu of the left internal capsule, and the globus pallidus.  The magnetic resonance angiogram of the circle of Willis showed some very mild intercranial atherosclerotic vascular changes present. Her swallowing study showed penetration of thin barium to the vocal cords in a chin-tuck position, thin barium was not administered with the head neutral. No pooling penetration or aspiration was noted with  puree and nectar thick material.  Her laboratory data initially revealed a hemoglobin of 12.4, hematocrit of 35.4, platelets of 262,000.  There was 76% polys, 16% lymphs, 4% monocytes, and 2% eosinophils.  PT was 13.5, INR was 1.1, PTT was 25.  Initial glucose was 307.  Sodium of 139, potassium 4.0, chloride of 106, creatinine was 0.7.  ISTAT revealed a pH of 7.37, PCO2 of 37.4, bicarbonate of 22.0.  CKs were not done.  Total cholesterol was 155, triglycerides 181, HDL cholesterol 39, LDL was 80.  A potassium in the hospital was as low as 3.0, but after receiving potassium went to 4.0 on February 16, 2000, with a sodium of 138, chloride 11, and a CO2 content of 22.  Her Doppler studies of the carotids revealed no significant ICA stenosis, vertebral flow was antegrade.  A 12-lead EKG showed normal sinus rhythm, was a normal EKG.  The 2-D echocardiogram revealed overall left ventricular systolic function which was normal.  The estimated left ventricular ejection fraction in the range of 55 to 65%.  There was no diagnostic evidence of left ventricular regional wall motion abnormalities.  There was mild aortic valvular regurgitation.  Right ventricular size was in the upper limits of normal.  Thickness of the tricuspid valve was mildly increased.  No cardiac source of embolism was identified.  HOSPITAL COURSE:  The patient was admitted with recurrent nausea and vomiting. MRI study showed evidence of acute left pontine globus palus posterior limb of the internal capsule small-vessel ischemic stroke.  Doppler studies and MR angiogram were consistent with small-vessel disease.  The patient was placed on heparin at the time of admission, but after a 2-D echocardiogram was unremarkable she was switched to Plavix therapy.  We were hesitant to give her aspirin in view of her history of allergy to ibuprofen.  In the hospital her capillary blood glucoses ranged from 186 to as high as 243 range, and  also as low as 163.  Her blood pressure ranged in the normal range.  She tolerated the  Plavix well without significant difficulties.  She was seen in consult by OT and PT, and her diet was gradually increased to a regular diet with thin liquids.  Her strength improved on the right side so that she had ability to touch each finger to her thumb on the right, and was essentially 4/5 proximally on the right, 4/5 on the right leg, and 3-4/5 distally in the right hand and arms.  She never exhibited any aphagia.  She did have mild right facial weakness.  IMPRESSION: 1. 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 2. Diabetes mellitus, code 250.60. 3. Allergy to ibuprofen, code 995.2. 4. Plavix 75 mg q.d. 5. Glucophage 500 mg p.o. b.i.d.  DIET:  An 1800 calorie regular diet with thin liquids.  THERAPIES:  She will have a four poster cane and outpatient PT and OT.  FOLLOWUP:  She will return in three weeks for re-evaluation.  ACTIVITY:  She is not to drive a car.  The patient was seen in consult in the hospital by rehab, and it was felt that she was a good outpatient candidate, and so OT and PT will be performed as an outpatient. DD:  02/17/00 TD:  02/19/00 Job: [id number]

## 2010-10-18 NOTE — Discharge Summary (Signed)
[place]. [facility]  Patient:    [name], [name]                     MRN: [id number] Adm. Date:  [id number] Disc. Date: [id number] Attending:  Faith Rogue T                           Discharge Summary  ADMISSION DIAGNOSIS:  Rule out stroke or transient ischemic attack.  DISCHARGE DIAGNOSES: 1. 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 2. Probable urinary tract infection.  The patient was put on Septra until    sensitivities were available, and then was transferred to rehab and    followed up.  SECONDARY DIAGNOSES: 1. Diabetes. 2. Known cerebrovascular accident. 3. Obesity.  PROCEDURES: 1. CT scan of the brain on July 08, 2000, that showed a subtle area of    hypoattenuation corresponding to lesions on prior MRI of February 15, 2000, negative for acute intracranial abnormality or bleed. 2. MRI scan of the brain showed a small acute right pons infarct. 3. Transesophageal echo showed a patent foramen ovale.  MEDICATIONS AT THE TIME OF DISCHARGE:  Intravenous heparin being converted to Coumadin, Pepcid, Reglan, insulin, Tylenol, and Septra.  The Septra was discontinued, and Tequin was started instead.  CONDITION ON DISCHARGE:  Dense left hemiparesis.  LABORATORY DATA:  Pro time was 15.8 and INR of 1.4 at the time of transfer to rehab.  Glucophage was also restarted.  HISTORY OF PRESENT ILLNESS:  Please see History and Physical for details.  She is a 55 year old woman with history of diabetes mellitus who was admitted with some dizziness, possibly some left arm symptoms which waxed and waned.  No visual field changes.  CT was unremarkable.  She had 4/5 strength in the left arm and left leg when admitted, and there was slightly decreased pinprick on the left as well.  Shortly after admission, she completed the stroke and was left with a dense left  hemiparesis.  Transesophageal echo was obtained, and as noted, showed patent foramen  ovale.  Because of this, it was felt she should be on Coumadin rather than Plavix and aspirin.  During her stay, she also developed some strong-smelling urine and a little dysuria and had urinalysis that was compatible with a urinary tract infection so she was started initially on Septra and then converted to Tequin and was still on that at the time of transfer to rehab.  Rehab was consulted and felt she was a good candidate.  Her non-insulin-dependent diabetes was in moderate control.  Her CBGs were in the 100-200 range.  She had been taken off her Glucophage on admission, and that was restarted.  She also had sliding scale insulin when needed.  She was making some progress in physical and occupational therapy, and then was discharged to rehab for further workup. DD:  09/15/00 TD:  09/15/00 Job: [id number]

## 2010-10-18 NOTE — H&P (Signed)
[place]. [facility]  Patient:    Jenna, Lawrence                     MRN: [id number] Adm. Date:  [id number] Attending:  Carmelina Peal                         History and Physical  PATIENT ADDRESS: [address] [place], [place] [address]  DATE OF BIRTH: Jul 10, 1955  CHIEF COMPLAINT: This is the second [facility] admission for this 55 year old right-handed, [place], married female from [place], [place], admitted from the emergency room for evaluation of right-sided weakness with nausea and vomiting.  HISTORY OF PRESENT ILLNESS: This patient has a one year history of diabetes mellitus, for which she has been admitted to [facility] in the past. She has been on oral hypoglycemic agents for approximately one year, she states, and capillary blood glucoses have indicated poor diabetic control in the 240 range, she states.  Last evening she noted onset of frontal headache and dizziness without true vertigo, beginning about 9 p.m.  This morning she noted onset of nausea and vomiting about 8:30 a.m., with right-sided numbness and weakness, and was taken to the [facility] Emergency Room.  There was no associated history of head or neck injury, drug abuse, hypertension, heart disease, or cigarette use.  She does not take aspirin.  ALLERGIES: IBUPROFEN, with tongue swelling.  MEDICATIONS: Glucophage 500 mg b.i.d.  SOCIAL HISTORY: She does not smoke cigarettes and does not drink alcohol.  She is married.  She has four children, sons ages 86, 47, 71, and 44, alive and well.  Her husband works in [place] but they are married and otherwise live together.  They have separate jobs.  FAMILY HISTORY: Her mother died with diabetes mellitus and colon cancer.  Her father died from heart disease at an unknown age.  She has a brother age 25 and another brother age 58, and a sister age 24; they are in good  health.  PHYSICAL EXAMINATION:  GENERAL: Well-developed, obese female, having nausea and vomiting, lying on her left side.  She appeared ill.  VITAL SIGNS: Blood pressure lying in the right arm was 110/60, left arm 120/60.  Heart rate was 65.  NECK: There were no bruits.  There was no enlargement of the thyroid.  NEUROLOGIC: Mental status showed she was alert and oriented x 3.  There was no aphasia.  Cranial nerve examination revealed visual fields were full.  There was question of some nystagmus with gaze to the right.  There were bilateral cataracts.  She had full visual fields.  Pupils were reactive 4-3 bilaterally. There was no Horners syndrome present.  There was decreased pinprick in the right face.  There was no definite seventh.  The tongue was midline and the uvula was midline.  Gags were present.  The sternocleidomastoid and trapezius testing revealed decreased right shoulder shrug.  Motor examination revealed 3/5 strength proximally in the right arm.  She could open and close her right hand as a fist.  She could dorsiflex and plantar flex her right hand.  She could life her right arm well off the bed.  She could not touch her thumb to the fingers of her right hand, however.  She had some withdrawal of her right leg, 2-3/5 proximally in the right leg, and 0/5 distally in the right foot. Sensory examination revealed  decreased pinprick of the right face, arm, and leg.  There was poor two-point discrimination of the fingertips of both hands. Deep tendon reflexes were 2+.  There were no ankle jerks.  Right plantar response was upgoing and left plantar response was downgoing.  HEENT: I could not see the tympanic membranes.  Mouth was in fair repair.  SKIN: The skin was okay.  There was a tattoo on the leg, a rose.  LUNGS: Clear to auscultation.  HEART: No murmurs.  BREAST: Normal.  ABDOMEN: There was no enlargement of the liver, spleen, and kidneys.  She is status post  previous cholecystectomy, with scar noted.  EXTREMITIES: No clubbing, cyanosis, or edema noted.  LABORATORY DATA: CT scan of the brain was normal, with some questionable small vessel ischemic disease in the right posterior limb of the internal capsule. Glucose was 300.  Hemoglobin 12.4, hematocrit 35.4, WBC not known at this time; platelets 262,000.  PTT 25, INR 1.1.  IMPRESSION: Left brain stroke, code 434.01.  2. Diabetes mellitus, code 250.6.  3. Obesity, code 278.01.  4. Allergy to ibuprofen, code 995.2.  PLAN: The plan at this time is to admit the patient to the hospital and place her on heparin, obtain an MRI and MR angiogram and Doppler studies, as well as 2D echocardiogram. DD:  02/13/00 TD:  02/13/00 Job: [id number]

## 2010-10-18 NOTE — Discharge Summary (Signed)
NAME:  [name], [name] NO.:  [id number]   MEDICAL RECORD NO.:  [id number]                   PATIENT TYPE:  EMS   LOCATION:  ED                                   [facility]ACILITY[facility]  [facility]   PHYSICIAN:  Sherin Quarry, MD                   DATE OF BIRTH:  [birth year]   DATE OF ADMISSION:  04/21/2002  DATE OF DISCHARGE:  04/22/2002                                 DISCHARGE SUMMARY   HISTORY OF PRESENT ILLNESS:  The patient is a 55 year old lady who presented  to the [facility] Emergency Room on 04/21/02, with severe vaginal  bleeding.  The patient has been on chronic Coumadin therapy after having  experienced two previous strokes.  On presentation to the emergency room she  was noted to have an INR of 21, ecchymoses were noted on her lower  extremities.  Presumably, the explanation for this is that the patient had  been receiving [facility] for tuberculosis [facility].  Because of the interaction between her medications and  Coumadin, her Coumadin dose had been increased until she was taking 15 mg of  Coumadin daily.  The patient completed her tuberculosis therapy recently,  but the dose of Coumadin had not been decreased.  Her recent past history is  also remarkable for colon cancer with colectomy and chemotherapy.   PHYSICAL EXAMINATION:  Physical exam at the time of admission as described  by Dr. Jillyn Hidden.  VITAL SIGNS:  Temperature 96.8, blood pressure 120/65, pulse 80,  respirations 20.  HEENT:  Within normal limits.  CHEST:  Clear.  CARDIOVASCULAR:  Normal S1 and S2 without murmurs, rubs, or gallops.  ABDOMEN:  Benign.  EXTREMITIES:  Evidence of ecchymoses.  PELVIC:  The patient had active vaginal bleeding.   LABORATORY DATA:  Hemoglobin of 9.3, microcytic indices.  INR was 21, as  previously stated.   HOSPITAL COURSE:  The patient was treated with fresh frozen plasma,  receiving a total of 4 units.  She also  received vitamin K 10 mg  subcutaneously.  She was monitored closely, vaginal bleeding seemed to  resolve.  On 04/22/02, her INR was down to 1.7.  As vaginal bleeding was  resolving, it was felt reasonable to discharge her at that time.   DISCHARGE DIAGNOSES:  1. Vaginal bleeding secondary to prolonged prothrombin time.  2. History of stroke.  3. Known uterine fibroids.  4. Diabetes.  5. History of colon cancer.   DISCHARGE MEDICATIONS:  1. Glucophage as previously ordered.  2. Amaryl as previously ordered.  3. Coumadin, which the patient was instructed to take in a dose of 5 mg on     Friday, Saturday, and Sunday, and then return to [facility] on Monday for a followup prothrombin time.  4. Iron 325 mg t.i.d.  Her hemoglobin level should be monitored when she  returns on Monday as well.  5. She was advised to continue her other medications.   Consideration might be given to a followup neurology consult with Dr. Sandria Manly.  It is a difficult issue, but the relative therapeutic efficacy of Coumadin  versus aspirin and Plavix for stroke is probably about equal.                                                Sherin Quarry, MD    SY/MEDQ  D:  04/22/2002  T:  04/22/2002  Job:  [id number]   cc:   [facility]

## 2010-10-18 NOTE — Op Note (Signed)
[place]. [facility]  Patient:    [name], [name] Visit Number: [id number] MRN: [id number]          Service Type: MED Location: 5000 [id number] [id number] Attending Physician:  Sanjuana Letters Dictated by:   Adolph Pollack, M.D. Proc. Date: 05/07/01 Admit Date:  04/21/2001                             Operative Report  PREOPERATIVE DIAGNOSIS:  Colon cancer.  POSTOPERATIVE DIAGNOSIS:  Colon cancer.  OPERATION PERFORMED:  Insertion of single lumen Port-A-Cath into the left subclavian vein.  SURGEON:  Adolph Pollack, M.D.  ANESTHESIA:  Local (1% plain lidocaine) with MAC.  INDICATIONS FOR PROCEDURE:  The patient is a 55 year old female with stage 3 colon cancer.  She is going to need long term venous access for chemotherapy. She now presents for Port-A-Cath placement.  The procedure and the risks were explained to her preoperatively.  DESCRIPTION OF PROCEDURE:  The patient was placed supine on the operating table and a general anesthetic was administered.  Her chest wall was sterilely prepped and draped.  Preoperatively because she is on heparin, it was held for six hours and because she was on Coumadin, she was given fresh frozen plasma. Her INR was 1.8 and she received two units of fresh frozen plasma.  Local anesthetic was infiltrated in the left infraclavicular region and then using the 18 gauge needle, the left subclavian vein was cannulated.  The wire was passed through the vein under fluoroscopic guidance into the superior vena cava.  Next, the needle was removed and incision was made around the wire.  Inferior to the wire more local anesthetic was infiltrated both superficially and deep and an incision was made in the left upper chest wall and a pocket for the Port-A-Cath created.  A tunnel was made from inferior to superior incision and the catheter was brought through that tunnel.  The port itself was then placed into its pocket.  It  was a preattached catheter measuring 9.6 [place].  The tract was then dilated and the dilator introducer was then placed over the wire into the superior vena cava as verified by fluoroscopy.  The wire and dilator were removed and the catheter was threaded through the introducer.  The peel-away sheath was then removed and the catheter remained in place.  Fluoroscopy was performed and showed the tip of the catheter in the superior vena cava and a nice gentle curvature going into the port.  Next the port was accessed and withdrew blood and flushed easily.  It was then anchored to the chest wall with interrupted 2-0 Prolene sutures.  The subcutaneous tissues over the port were closed with running 3-0 Vicryl suture and the skin closed with 4-0 Monocryl subcuticular stitch.  The superior infraclavicular wound was then closed with a 4-0 Monocryl subcuticular stitch. Steri-Strips and sterile dressing were applied.  A Huber needle was used to access the port which again withdrew blood and flushed easily.  A sterile dressing was then applied.  The patient tolerated the procedure well without any apparent complications and was taken to the recovery room in satisfactory condition. A portable chest x-ray is pending. Dictated by:   Adolph Pollack, M.D. Attending Physician:  Sanjuana Letters DD:  05/07/01 TD:  05/08/01 Job: [id number]

## 2010-10-18 NOTE — Procedures (Signed)
[place]. [facility]  Patient:    [name], [name] Visit Number: [id number] MRN: [id number]          Service Type: MED Location: 5000 5037 01 Attending Physician:  Sanjuana Letters Dictated by:   Hedwig Morton. Juanda Chance, M.D. [facility] Admit Date:  04/21/2001   CC:         William A. Leveda Anna, M.D.   Procedure Report  NAME OF PROCEDURE:  Colonoscopy.  INDICATION:  This 55 year old native [place] female was admitted with rectal bleeding, anemia, hemoccult positive stools.  She has had intermittent painless bleeding for the past year.  She also has been anticoagulated long-term because of series of embolic CVAs secondary to a patent foramen ovale.  She was prepped for colonoscopy then was discontinued and heparin held five hours prior to the procedure.  ENDOSCOPE:  Fujinon single-channel videoscope.  SEDATION:  Versed 5 mg IV, Fentanyl 50 mcg IV.  FINDINGS:  Fujinon single-channel videoscope passed under direct vision through rectum to the sigmoid colon.  Patient was monitored by pulse oximeter. Her prep was excellent.  Anal canal was normal.  There were some hypertrophic papillae in the anal canal.  At the level of 25 cm from the rectum was a circular ulcerated area with raised edges, somewhat infiltrating, with a depressed center suggestive of malignancy.  It was nonobstructing.  Video photographs were obtained.  Also, multiple biopsies were obtained.  The lesion measured about 2-3 cm in diameter.  There were some streaks of bright red blood in the sigmoid and descending colon.  At the level of 35 cm from the rectum was another lesion which appeared to be ulcerated with nodularity, some necrotic material in the ulcerated area.  The center of the lesion as well as the edges were biopsied.  There was some bleeding from the lesion after it was biopsied.  Again, the lesion was nonobstructing and covering only one part of the wall; also about 2-3 cm diameter.   The rest of the colon was unremarkable with normal splenic flexure, transverse colon, hepatic flexure, ascending colon to the cecum.  Cecal pouch, ileocecal unremarkable.  Colonoscope was then retracted.  Video photographs of the both lesions were obtained.  IMPRESSION:  Two colon lesions at 25 and 35 cm, ulcerated, rule out neoplasm, status post multiple biopsies.  These lesions are most likely causing patients bleeding.  PLAN 1. Await results of the biopsies. 2. Hold heparin and Coumadin for now. 3. Obtain CEA tumor marker.  Depending on the histology, patient may need resection of these lesions. Dictated by:   Hedwig Morton. Juanda Chance, M.D. [facility] Attending Physician:  Sanjuana Letters DD:  04/26/01 TD:  04/26/01 Job: [id number]

## 2010-10-18 NOTE — Discharge Summary (Signed)
NAMESALEHA, KALP NO.:  [id number]   MEDICAL RECORD NO.:  [id number]          PATIENT TYPE:  OBV   LOCATION:  9316                          FACILITY:  [facility]   PHYSICIAN:  Duke Salvia. Marcelle Overlie, M.D.DATE OF BIRTH:  [birth year]   DATE OF ADMISSION:  11/28/2004  DATE OF DISCHARGE:  11/29/2004                                 DISCHARGE SUMMARY   DISCHARGE DIAGNOSES:  1.  Menorrhagia with symptomatic leiomyoma.  2.  Total vaginal hysterectomy this admission.   For summary of the history and physical exam, please see admission H&P for  details.  Briefly, a 55 year old G5, P4 with menorrhagia secondary to  leiomyoma, admitted for [facility].   HOSPITAL COURSE:  On 11/28/04, under general anesthesia.  The patient  underwent uncomplicated TVH, EBL 150 mL.  The following a.m., her hemoglobin  was 11.5.  She was afebrile, tolerating a regular diet and had established a  normal urinary pattern.  Her abdominal exam was unremarkable at that time.  The evening after surgery, she was given Lovenox and her Coumadin was  restarted.   LABORATORY DATA:  UA on admission normal except for glucose greater than  1000.  CBC on admission:  Hemoglobin 13.3, hematocrit 41.1, platelets  260,000. UA microscopic negative, except for 0-2 WBC.  Postoperative  hemoglobin, WBC 8.3, hemoglobin 11.5, hematocrit 34.4, platelets 214,000.  Other preoperative labs; her APTT had normalized to 27 after being off of  her Coumadin for one week.  INR was 1.0 with pro time 13.4.   DISPOSITION:  The patient is discharged to resume her daily Coumadin and her  diabetes medications.  She was given Darvocet N 100 p.r.n.  Will return to  our office in one week.  Advised to report any increased vaginal bleeding,  fever over 101 or urinary complaints. She was given specific instructions  regarding diet, sex, exercise.   CONDITION:  Good.   ACTIVITY:  Good and increase.       RMH/MEDQ  D:  11/29/2004  T:  11/29/2004   Job:  [id number]   cc:   Jeannett Senior A. Clent Ridges, M.D. [facility]

## 2010-10-18 NOTE — Consult Note (Signed)
. [facility]  Patient:    [name], [name] Visit Number: [id number] MRN: [id number]          Service Type: MED Location: 5000 5037 01 Attending Physician:  Sanjuana Letters Dictated by:   Angelia Mould. Derrell Lolling, M.D. Proc. Date: 04/28/01 Admit Date:  04/21/2001   CC:         William A. Leveda Anna, M.D.  Hedwig Morton. Juanda Chance, M.D. [facility]   Consultation Report  REASON FOR CONSULTATION:  Evaluate bleeding ulcer of sigmoid colon.  HISTORY OF PRESENT ILLNESS:  This is a 55 year old Native American woman who was admitted on April 21, 2001, with painless hematochezia. Her hemoglobin is stable at 9.7 to 10.3. She states that she has had some intermittent bleeding off and on for more than one year, but it was much more severe on the day of admission. She has been stable during her hospitalization. She was anticoagulated, so she was taken off of her Coumadin and placed on heparin. Colonoscopy was performed on April 26, 2000. That revealed an ulcerated mass at 25 cm. Biopsies, unfortunately, were not non diagnostic. Flexible sigmoidoscopy was repeated today, and Dora M. Juanda Chance, M.D. reconfirmed that there is a 3.0 cm diameter ulcer at about 30 cm, thought to be in the mid sigmoid. This was rebiopsied. Also noted was a small polyp which was completely removed from the distal sigmoid colon. Hedwig Morton. Juanda Chance, M.D. feels that the ulcerated mass is carcinoma and is the source of bleeding, and we have been asked to see the patient to consider resection. The patient is doing well following that procedure, but it should be noted that she ate a regular diet yesterday and today.  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 She has a patent foramen ovale and is on lifelong Coumadin. She has type 2 diabetes. She has had a cholecystectomy in 1982. She had a barium enema in 1992 which  showed "two sigmoid lesions." She states that Dr. Leonette Most ______ referred her to a gastroenterologist, but she never followed up. She is perimenopausal.  CURRENT MEDICATIONS:  (At home)  Aspirin, Coumadin, Glucophage, ramipril, Folvite.  DRUG ALLERGIES:  IBUPROFEN.  SOCIAL HISTORY:  The patient denies the use of alcohol, tobacco, or drugs. She works at the [employer]. She has four sons. She is separated. The father supports her financially and emotionally for the child. The husband has been helping her recover from strokes.  FAMILY HISTORY:  Severe alcoholism in the family. No history of breast or uterine cancer. The mother had colon cancer and diabetes.  PHYSICAL EXAMINATION:  GENERAL:  Pleasant, somewhat overweight, middle-aged, [place] woman in no distress. She is alert, cooperative, oriented.  VITAL SIGNS:  Temperature 96.9, heart rate 86, blood pressure 110/88.  HEENT:  Sclerae clear. Extraocular movements intact.  NECK:  Supple. Nontender. No mass. No bruits.  LUNGS:  Clear to auscultation. No CVA tenderness.  HEART:  Regular rate and rhythm. No murmur or click.  ABDOMEN:  Slightly obese, soft, nontender. No mass. There is a well-healed right subcostal scar.  LABORATORY DATA:  CEA 3.1. Liver function tests normal. Hemoglobin 9.9, platelet count 313. BUN 8, creatinine 0.6, glucose 170.  IMPRESSION: 1. 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 2. Distal sigmoid polyp, status post complete colonoscopic resection.    Pathology pending. 3.  Patent foramen ovale, needs SBE prophylaxis for surgical procedures. 4. Status post pontine strokes, needs lifelong anticoagulation. 5. Diabetes mellitus, type 2.  PLAN: 1. We will place the patient on a liquid diet and complete her bowel prep    tomorrow and proceed with sigmoid colon resection on Friday, April 30, 2001. 2. We will hold her heparin until the  postop period. 3. She will be given ampicillin and gentamicin as SBE prophylaxis    perioperatively. 4. The procedure, its risks, and recovery have been discussed with the patient    and her daughters in great detail. They understand all of the issues. At    this time, all of their questions are answered. They are all in agreement    with the plan. I will ask one of my partners to assume her care as I will    be out of town for the next four days. This is acceptable with her and her    family. Dictated by:   Angelia Mould. Derrell Lolling, M.D. Attending Physician:  Sanjuana Letters DD:  04/28/01 TD:  04/29/01 Job: [id number]

## 2010-10-18 NOTE — Procedures (Signed)
[place]  Patient:    [name], [name] Visit Number: [id number] MRN: [id number]          Service Type: MED Location: [phone] Attending Physician:  Tobin Chad Dictated by:   Charlaine Dalton. Sherene Sires, M.D. [facility] Proc. Date: 08/20/01 Admit Date:  08/16/2001                             Procedure Report  PROCEDURE:  Fiberoptic bronchoscopy with transbronchial biopsy of the right upper lobe.  HISTORY AND INDICATIONS:  Please see dictated consultation note in the records dated 08/19/01.  DESCRIPTION OF PROCEDURE:  The patient agreed to the procedure after full discussion of risks, benefits, and alternatives.  She was premedicated with a total of 2 mg of IV Versed, and required 50 mg of IV Demerol for adequate sedation and cough suppression.  She was continually monitored by surface EEG and oximetry to maintain saturations throughout the procedure in the mid to upper 80s on supplemental oxygen, but tolerated this well.  Initially, the patient was treated topically with 10% lidocaine to the right naris and oropharynx.  Additionally, the 2% lidocaine was applied to the right naris.  Using a standard flexible fiberoptic bronchoscope, the right naris was easily cannulated with good visualization of the entire oropharynx and larynx.  The cords moved normally, and there were no apparent upper airway lesions.  Using additional 1% lidocaine as needed, the entire ______ was ______ bilaterally.  FINDINGS: 1. The trachea, corina, and all the left-sided airways were normal. 2. The right-sided airways were normal except for the left lower lobe superior    segment which appeared markedly distorted and concentrically narrow to the    point where I could not easily pass the bronchoscope and make the turn into    the airway.  Because of the location of the cavity on x-ray and the dorsal    aspect of the superior segment, I did not feel it would be  practical to try    to biopsy this area, not being able to make the turn into the superior    segment orifices.  I therefore elected to biopsy a similar area in the right upper lobe and performed this by forceps technique x4 with adequate tissue obtained.  I also obtained lavage for cytology, AFB, and fungal stain and culture, as well as PCP stain.  The patient tolerated the procedure well.  Followup chest x-ray pending.  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  RECOMMENDATIONS:  Await the above studies. Dictated by:   Charlaine Dalton. Sherene Sires, M.D. [facility] Attending Physician:  Tobin Chad DD:  08/20/01 TD:  08/23/01 Job: [id number]

## 2010-10-18 NOTE — Discharge Summary (Signed)
[place]  Patient:    [name], [name]                     MRN: [id number] Adm. Date:  [id number] Disc. Date: [id number] Attending:  Faith Rogue T Dictator:   Dian Situ, PA CC:         Al Decant. Janey Greaser, M.D.  Dr. Anne Hahn   Discharge Summary  DISCHARGE DIAGNOSES: 1. Status post right cerebrovascular accident. 2. Blood pressures controlled without medications. 3. Diabetes mellitus. 4. ________  HISTORY OF PRESENT ILLNESS:  [name] is a 55 year old female with history of diabetes mellitus, CVA in September 2001, admitted to [facility] July 08, 2000, with numbness of the left upper extremity, left lower extremity and slurred speech.  MRI/MRA showed acute right paramedian pontine infarct, negative MRA.  The patient was started on IV heparin.  TEE done showed EF of 55 to 65% with positive PFO.  Coumadin added on July 11, 2000.  He has had problems with dysuria with urine culture growing greater than 100,000 colonies of yeast. Chest x-ray at the time of admission had a question of left upper extremity pneumonia.  She continues with dense left hemiparesis and is currently min. to max. assist for bed mobility, mod. to max. assist for transfers, needing assistance with ______________.  Gait not tested.  PAST MEDICAL HISTORY:  See discharge diagnosis plus history of cholecystectomy and obesity.  ALLERGIES:  No known drug allergies.  SOCIAL HISTORY:  The patient is married and lives in a two-level home with bedroom on second floor with 11 to 12 steps to bedroom.  She does not use any tobacco or alcohol.  Husband is supportive.  HOSPITAL COURSE:  [name] was admitted to rehab on July 13, 2000, for inpatient therapies to consist of PT and OT daily. Past admission, the patient was maintained on subcutaneous Lovenox until Coumadin had therapeutic levels.  Diabetes was monitored with  a.c. and h.s. CBG checks.  As there was a question of left upper lobe pneumonia at admission, follow-up chest x-ray was done past admission showing no active disease and with some cardiac enlargement.  She was treated with Diflucan for her Candida UTI.  Labs were done at admission showing the patient to be mildly hypokalemic requiring supplementation.  Recheck labs of July 17, 2000, shows hypokalemia resolved with sodium 138, potassium 3.8, chloride 106, CO2 23, BUN 13, creatinine 0.6, glucose 197.  LFTs within normal limits.  CBC on July 27, 2000, shows white count at 5.1, hemoglobin 11.8, hematocrit 34.9, platelets 277.  Stool guaiacs x 1 was checked and was negative.  She was noted to have some complaints of diarrhea with C. diff. toxin being negative.  The patients hypoglycemics were adjusted for tighter blood sugar control.  At the time of discharge blood sugar noted to be ranging from 97 to an occasional high in the 140s.  Her p.o. intake has been good.  Blood pressures have been well-controlled off of medications ranging from 110s to 120s systolic and 60 to 70s diastolic.  The patients left hemiparesis was noted to be improving.  By the time of discharge she was able to use her left upper extremity to stabilize with min. assist.  She was able to propel wheelchair with supervision requiring occasional rest break secondary to decreased endurance.  Speech therapy evaluation and revealed the patient to have basic comprehension and expression intact; however, expression was noted to be  mildly impaired and had resolved by the time of discharge.  She was able to follow three-step commands with cues.  Demonstration of ____________________ was inconsistent.  Immediate recall and past delay of five minutes were intact.  In terms of ADLs, she required min. assist for upper body bathing, is at supervision for lower body care as well as toileting. She is modified independent for  transfers with and without short-base quadcane.  She requires close supervision for ambulating greater than 150 feet with short-base quadcane.  She did improve with strength in her left lower extremity; however, only trace left dorsiflexion was noted and a custom AFO was ordered.  Family was instructed regarding mobility as well as safety issues.  Outpatient PT and OT and speech therapy was set up to begin at outpatient rehab past discharge.  On July 29, 2000, the patient was discharged to home.  DISCHARGE MEDICATIONS: 1. Coumadin 5 mg per day. 2. Pepcid 20 mg per day. 3. Glucophage 1000 mg a.c. breakfast and supper, 500 mg a.c. lunch. 4. Senokot S two p.o. q.h.s.  ACTIVITIES:  She needs 24-hour supervision.  DIET:  She will be on a 2000 calorie diabetic diet.  SPECIAL INSTRUCTIONS:  No alcohol, no smoking, no driving.  FOLLOW-UP:  The patient is to follow up with Al Decant. Janey Greaser, M.D., at [facility] for recheck including adjustment in Coumadin; follow-up with Faith Rogue, M.D., September 07, 2000, at 10:30 a.m.; follow-up with Dr. Anne Hahn in one month. DD:  09/07/00 TD:  09/07/00 Job: [id number]

## 2011-02-21 LAB — DIFFERENTIAL
Basophils Absolute: 0
Lymphocytes Relative: 43
Lymphs Abs: 2.6
Neutro Abs: 2.8
Neutrophils Relative %: 46

## 2011-02-21 LAB — CBC
HCT: 39.3
MCHC: 33.9
MCV: 76.2 — ABNORMAL LOW
RBC: 5.16 — ABNORMAL HIGH

## 2011-02-21 LAB — COMPREHENSIVE METABOLIC PANEL
AST: 15
BUN: 13
CO2: 25
Calcium: 9
Chloride: 107
Creatinine, Ser: 0.55
GFR calc Af Amer: >60
GFR calc non Af Amer: >60
Glucose, Bld: 241 — ABNORMAL HIGH
Total Bilirubin: 0.5

## 2011-02-21 LAB — PROTIME-INR
INR: 2.7 — ABNORMAL HIGH
Prothrombin Time: 29.3 — ABNORMAL HIGH

## 2011-03-04 LAB — DIFFERENTIAL
Basophils Relative: 0 % (ref 0–1)
Eosinophils Absolute: 0.2 10*3/uL (ref 0.0–0.7)
Eosinophils Relative: 3 % (ref 0–5)
Monocytes Absolute: 0.4 10*3/uL (ref 0.1–1.0)
Monocytes Relative: 6 % (ref 3–12)
Neutrophils Relative %: 63 % (ref 43–77)

## 2011-03-04 LAB — POCT CARDIAC MARKERS
Myoglobin, poc: 86 ng/mL (ref 12–200)
Troponin i, poc: <0.05 ng/mL (ref 0.00–0.09)

## 2011-03-04 LAB — URINE MICROSCOPIC-ADD ON

## 2011-03-04 LAB — COMPREHENSIVE METABOLIC PANEL
ALT: 17 U/L (ref 0–35)
AST: 19 U/L (ref 0–37)
Alkaline Phosphatase: 98 U/L (ref 39–117)
Glucose, Bld: 122 mg/dL — ABNORMAL HIGH (ref 70–99)
Potassium: 3.7 mEq/L (ref 3.5–5.1)
Sodium: 141 mEq/L (ref 135–145)
Total Protein: 7.1 g/dL (ref 6.0–8.3)

## 2011-03-04 LAB — URINALYSIS, ROUTINE W REFLEX MICROSCOPIC
Nitrite: NEGATIVE
Protein, ur: NEGATIVE mg/dL
Specific Gravity, Urine: 1.008 (ref 1.005–1.030)
Urobilinogen, UA: 1 mg/dL (ref 0.0–1.0)

## 2011-03-04 LAB — CBC
Hemoglobin: 13.1 g/dL (ref 12.0–15.0)
RBC: 5.15 MIL/uL — ABNORMAL HIGH (ref 3.87–5.11)
RDW: 16 % — ABNORMAL HIGH (ref 11.5–15.5)

## 2011-03-04 LAB — LIPASE, BLOOD: Lipase: 26 U/L (ref 11–59)

## 2011-03-04 LAB — URINE CULTURE

## 2011-03-04 LAB — PROTIME-INR: INR: 5.9 (ref 0.00–1.49)

## 2011-11-06 ENCOUNTER — Inpatient Hospital Stay (HOSPITAL_COMMUNITY)
Admission: EM | Admit: 2011-11-06 | Discharge: 2011-11-08 | DRG: 378 | Disposition: A | Discharge status: Home / Self Care | Payer: Medicaid Other | Attending: Internal Medicine | Admitting: Internal Medicine

## 2011-11-06 ENCOUNTER — Encounter (HOSPITAL_COMMUNITY): Payer: Self-pay | Admitting: *Deleted

## 2011-11-06 DIAGNOSIS — J069 Acute upper respiratory infection, unspecified: Secondary | ICD-10-CM

## 2011-11-06 DIAGNOSIS — Z79899 Other long term (current) drug therapy: Secondary | ICD-10-CM

## 2011-11-06 DIAGNOSIS — R1013 Epigastric pain: Secondary | ICD-10-CM | POA: Diagnosis present

## 2011-11-06 DIAGNOSIS — R109 Unspecified abdominal pain: Secondary | ICD-10-CM | POA: Diagnosis present

## 2011-11-06 DIAGNOSIS — Z86010 Personal history of colonic polyps: Secondary | ICD-10-CM

## 2011-11-06 DIAGNOSIS — D689 Coagulation defect, unspecified: Secondary | ICD-10-CM

## 2011-11-06 DIAGNOSIS — K633 Ulcer of intestine: Secondary | ICD-10-CM

## 2011-11-06 DIAGNOSIS — Q211 Atrial septal defect: Secondary | ICD-10-CM

## 2011-11-06 DIAGNOSIS — R040 Epistaxis: Secondary | ICD-10-CM

## 2011-11-06 DIAGNOSIS — I10 Essential (primary) hypertension: Secondary | ICD-10-CM | POA: Diagnosis present

## 2011-11-06 DIAGNOSIS — B86 Scabies: Secondary | ICD-10-CM

## 2011-11-06 DIAGNOSIS — Z85038 Personal history of other malignant neoplasm of large intestine: Secondary | ICD-10-CM | POA: Diagnosis present

## 2011-11-06 DIAGNOSIS — K921 Melena: Principal | ICD-10-CM | POA: Diagnosis present

## 2011-11-06 DIAGNOSIS — T782XXA Anaphylactic shock, unspecified, initial encounter: Secondary | ICD-10-CM

## 2011-11-06 DIAGNOSIS — R112 Nausea with vomiting, unspecified: Secondary | ICD-10-CM | POA: Diagnosis present

## 2011-11-06 DIAGNOSIS — E785 Hyperlipidemia, unspecified: Secondary | ICD-10-CM

## 2011-11-06 DIAGNOSIS — I6350 Cerebral infarction due to unspecified occlusion or stenosis of unspecified cerebral artery: Secondary | ICD-10-CM | POA: Diagnosis present

## 2011-11-06 DIAGNOSIS — D518 Other vitamin B12 deficiency anemias: Secondary | ICD-10-CM

## 2011-11-06 DIAGNOSIS — T45515A Adverse effect of anticoagulants, initial encounter: Secondary | ICD-10-CM | POA: Diagnosis present

## 2011-11-06 DIAGNOSIS — R32 Unspecified urinary incontinence: Secondary | ICD-10-CM

## 2011-11-06 DIAGNOSIS — I69959 Hemiplegia and hemiparesis following unspecified cerebrovascular disease affecting unspecified side: Secondary | ICD-10-CM

## 2011-11-06 DIAGNOSIS — Z7901 Long term (current) use of anticoagulants: Secondary | ICD-10-CM

## 2011-11-06 DIAGNOSIS — R079 Chest pain, unspecified: Secondary | ICD-10-CM

## 2011-11-06 DIAGNOSIS — K922 Gastrointestinal hemorrhage, unspecified: Secondary | ICD-10-CM

## 2011-11-06 DIAGNOSIS — J019 Acute sinusitis, unspecified: Secondary | ICD-10-CM

## 2011-11-06 DIAGNOSIS — L293 Anogenital pruritus, unspecified: Secondary | ICD-10-CM

## 2011-11-06 DIAGNOSIS — H60399 Other infective otitis externa, unspecified ear: Secondary | ICD-10-CM

## 2011-11-06 DIAGNOSIS — K3189 Other diseases of stomach and duodenum: Secondary | ICD-10-CM | POA: Diagnosis present

## 2011-11-06 DIAGNOSIS — Z794 Long term (current) use of insulin: Secondary | ICD-10-CM

## 2011-11-06 DIAGNOSIS — E119 Type 2 diabetes mellitus without complications: Secondary | ICD-10-CM | POA: Diagnosis present

## 2011-11-06 DIAGNOSIS — D649 Anemia, unspecified: Secondary | ICD-10-CM

## 2011-11-06 DIAGNOSIS — K5289 Other specified noninfective gastroenteritis and colitis: Secondary | ICD-10-CM | POA: Diagnosis present

## 2011-11-06 DIAGNOSIS — B373 Candidiasis of vulva and vagina: Secondary | ICD-10-CM

## 2011-11-06 HISTORY — DX: Cerebral infarction, unspecified: I63.9

## 2011-11-06 HISTORY — DX: Malignant neoplasm of colon, unspecified: C18.9

## 2011-11-06 LAB — URINALYSIS, ROUTINE W REFLEX MICROSCOPIC
Glucose, UA: 100 mg/dL — AB
Ketones, ur: NEGATIVE mg/dL
Leukocytes, UA: NEGATIVE
Nitrite: NEGATIVE
Protein, ur: NEGATIVE mg/dL
Urobilinogen, UA: 0.2 mg/dL (ref 0.0–1.0)

## 2011-11-06 MED ORDER — SODIUM CHLORIDE 0.9 % IV BOLUS (SEPSIS)
1000.0000 mL | Freq: Once | INTRAVENOUS | Status: AC
Start: 2011-11-06 — End: 2011-11-07
  Administered 2011-11-06: 1000 mL via INTRAVENOUS

## 2011-11-06 MED ORDER — LORAZEPAM 2 MG/ML IJ SOLN
1.0000 mg | Freq: Once | INTRAMUSCULAR | Status: AC
  Administered 2011-11-06: 1 mg via INTRAVENOUS
  Filled 2011-11-06: qty 1

## 2011-11-06 MED ORDER — HYDROMORPHONE HCL PF 1 MG/ML IJ SOLN: 1.0000 mg | Freq: Once | INTRAMUSCULAR | Status: DC

## 2011-11-06 NOTE — ED Notes (Signed)
Pt c/o of persistent abdominal pain, diarrhea, nausea and vomiting for the past week. Pt reports every time she eats she vomits and has diarrhea. Pt's last meal was last night. Pt's last episode with vomiting and diarrhea was last night after she ate.

## 2011-11-06 NOTE — ED Notes (Signed)
Pt c/o abd pain; vomiting; diarrhea x 1 wk; states large amt bright red blood in stool today

## 2011-11-06 NOTE — ED Provider Notes (Signed)
History     CSN: [id number]  Arrival date & time 11/06/11  Mikle Bosworth   First MD Initiated Contact with Patient 11/06/11 2301      Chief Complaint  Patient presents with  . Melena  . Abdominal Pain    (Consider location/radiation/quality/duration/timing/severity/associated sxs/prior treatment) Patient is a 56 y.o. female presenting with abdominal pain. The history is provided by the patient.  Abdominal Pain The primary symptoms of the illness include abdominal pain.   patient here with bloody stools x3 days. She is on Coumadin due to her history of CVA. Notes abdominal cramping with vomiting that has been nonbilious or with blood. No fever appreciated. History of colon cancer as well. No medications taken for this prior to arrival. Nothing makes it better or worse  Past Medical History  Diagnosis Date  . Diabetes mellitus   . Colon cancer   . Stroke     Past Surgical History  Procedure Date  . Colon surgery     No family history on file.  History  Substance Use Topics  . Smoking status: Never Smoker   . Smokeless tobacco: Not on file  . Alcohol Use: No    OB History    Grav Para Term Preterm Abortions TAB SAB Ect Mult Living                  Review of Systems  Gastrointestinal: Positive for abdominal pain.  All other systems reviewed and are negative.    Allergies  Glipizide; Hydrocodone-acetaminophen; Ibuprofen; Iohexol; Morphine; Rosiglitazone-metformin; and Sulfamethoxazole  Home Medications   Current Outpatient Rx  Name Route Sig Dispense Refill  . FERROUS SULFATE 325 (65 FE) MG PO TABS Oral Take 325 mg by mouth 2 (two) times daily.    . INSULIN ASPART 100 UNIT/ML [place] SOLN Subcutaneous Inject 10-35 Units into the skin See admin instructions. Takes 10 units every night and 34 units every morning    . METFORMIN HCL 1000 MG PO TABS Oral Take 1,000 mg by mouth 2 (two) times daily with a meal.    . VITAMIN B-12 1000 MCG PO TABS Oral Take 1,000 mcg by mouth  daily.    . WARFARIN SODIUM 1 MG PO TABS Oral Take 0.5 mg by mouth See admin instructions. Takes 1/2 tablet along with 5mg  tablet only on tuesdays, and thursdays    . WARFARIN SODIUM 5 MG PO TABS Oral Take 5 mg by mouth See admin instructions. Takes 1 tablet daily on tuesdays and thursdays for 5mg  dosage, and takes 1 tablet along with 1/2 tablet of 1mg  tablet on every day exdcept tuesdays and thursdays      BP 125/71  Pulse 72  Temp(Src) 97.9 F (36.6 C) (Oral)  SpO2 97%  Physical Exam  Nursing note and vitals reviewed. Constitutional: She is oriented to person, place, and time. She appears well-developed and well-nourished.  Non-toxic appearance. No distress.  HENT:  Head: Normocephalic and atraumatic.  Eyes: Conjunctivae, EOM and lids are normal. Pupils are equal, round, and reactive to light.  Neck: Normal range of motion. Neck supple. No tracheal deviation present. No mass present.  Cardiovascular: Normal rate, regular rhythm and normal heart sounds.  Exam reveals no gallop.   No murmur heard. Pulmonary/Chest: Effort normal and breath sounds normal. No stridor. No respiratory distress. She has no decreased breath sounds. She has no wheezes. She has no rhonchi. She has no rales.  Abdominal: Soft. Normal appearance and bowel sounds are normal. She exhibits no distension.  There is no tenderness. There is no rebound and no CVA tenderness.  Genitourinary:       Gross blood noted in her stool on rectal exam  Musculoskeletal: Normal range of motion. She exhibits no edema and no tenderness.  Neurological: She is alert and oriented to person, place, and time. She has normal strength. No cranial nerve deficit or sensory deficit. GCS eye subscore is 4. GCS verbal subscore is 5. GCS motor subscore is 6.  Skin: Skin is warm and dry. No abrasion and no rash noted.  Psychiatric: She has a normal mood and affect. Her speech is normal and behavior is normal.    ED Course  Procedures (including  critical care time)  Labs Reviewed  URINALYSIS, ROUTINE W REFLEX MICROSCOPIC - Abnormal; Notable for the following:    Glucose, UA 100 (*)    All other components within normal limits  CBC  DIFFERENTIAL  COMPREHENSIVE METABOLIC PANEL  TYPE AND SCREEN  PROTIME-INR  APTT   No results found.   No diagnosis found.    MDM  Patient will be admitted for evaluation of her gastrointestinal bleeding. She has been hemodynamically stable here        Toy Baker, MD 11/07/11 0111

## 2011-11-07 ENCOUNTER — Encounter (HOSPITAL_COMMUNITY)
Admission: EM | Disposition: A | Discharge status: Home / Self Care | Payer: Self-pay | Source: Home / Self Care | Attending: Internal Medicine

## 2011-11-07 ENCOUNTER — Encounter (HOSPITAL_COMMUNITY): Payer: Self-pay

## 2011-11-07 DIAGNOSIS — D689 Coagulation defect, unspecified: Secondary | ICD-10-CM | POA: Diagnosis present

## 2011-11-07 DIAGNOSIS — D684 Acquired coagulation factor deficiency: Secondary | ICD-10-CM

## 2011-11-07 DIAGNOSIS — K921 Melena: Principal | ICD-10-CM

## 2011-11-07 DIAGNOSIS — C189 Malignant neoplasm of colon, unspecified: Secondary | ICD-10-CM

## 2011-11-07 DIAGNOSIS — G459 Transient cerebral ischemic attack, unspecified: Secondary | ICD-10-CM

## 2011-11-07 DIAGNOSIS — Z85038 Personal history of other malignant neoplasm of large intestine: Secondary | ICD-10-CM | POA: Diagnosis present

## 2011-11-07 DIAGNOSIS — D649 Anemia, unspecified: Secondary | ICD-10-CM

## 2011-11-07 DIAGNOSIS — K922 Gastrointestinal hemorrhage, unspecified: Secondary | ICD-10-CM

## 2011-11-07 HISTORY — PX: ESOPHAGOGASTRODUODENOSCOPY: SHX5428

## 2011-11-07 LAB — COMPREHENSIVE METABOLIC PANEL
Albumin: 3.7 g/dL (ref 3.5–5.2)
Alkaline Phosphatase: 94 U/L (ref 39–117)
BUN: 10 mg/dL (ref 6–23)
Chloride: 105 mEq/L (ref 96–112)
Creatinine, Ser: 0.54 mg/dL (ref 0.50–1.10)
GFR calc Af Amer: >90 mL/min (ref >90)
GFR calc non Af Amer: >90 mL/min (ref >90)
Glucose, Bld: 217 mg/dL — ABNORMAL HIGH (ref 70–99)
Potassium: 3.6 mEq/L (ref 3.5–5.1)
Total Bilirubin: 0.3 mg/dL (ref 0.3–1.2)

## 2011-11-07 LAB — DIFFERENTIAL
Basophils Relative: 0 % (ref 0–1)
Lymphs Abs: 2.3 10*3/uL (ref 0.7–4.0)
Monocytes Absolute: 0.4 10*3/uL (ref 0.1–1.0)
Monocytes Relative: 6 % (ref 3–12)
Neutro Abs: 3.5 10*3/uL (ref 1.7–7.7)

## 2011-11-07 LAB — GLUCOSE, CAPILLARY
Glucose-Capillary: 174 mg/dL — ABNORMAL HIGH (ref 70–99)
Glucose-Capillary: 135 mg/dL — ABNORMAL HIGH (ref 70–99)

## 2011-11-07 LAB — HEMOGLOBIN AND HEMATOCRIT, BLOOD
HCT: 31.9 % — ABNORMAL LOW (ref 36.0–46.0)
Hemoglobin: 10.6 g/dL — ABNORMAL LOW (ref 12.0–15.0)
Hemoglobin: 10.4 g/dL — ABNORMAL LOW (ref 12.0–15.0)

## 2011-11-07 LAB — CBC
HCT: 35.9 % — ABNORMAL LOW (ref 36.0–46.0)
Hemoglobin: 11.8 g/dL — ABNORMAL LOW (ref 12.0–15.0)
MCH: 25.4 pg — ABNORMAL LOW (ref 26.0–34.0)
MCHC: 32.9 g/dL (ref 30.0–36.0)
RBC: 4.65 MIL/uL (ref 3.87–5.11)

## 2011-11-07 LAB — TYPE AND SCREEN: ABO/RH(D): O POS

## 2011-11-07 LAB — PROTIME-INR: Prothrombin Time: 38.4 seconds — ABNORMAL HIGH (ref 11.6–15.2)

## 2011-11-07 LAB — ABO/RH: ABO/RH(D): O POS

## 2011-11-07 LAB — HEMOGLOBIN A1C: Mean Plasma Glucose: 189 mg/dL — ABNORMAL HIGH (ref <117)

## 2011-11-07 SURGERY — EGD (ESOPHAGOGASTRODUODENOSCOPY)
Anesthesia: Moderate Sedation

## 2011-11-07 MED ORDER — SODIUM CHLORIDE 0.45 % IV SOLN
Freq: Once | INTRAVENOUS | Status: AC
  Administered 2011-11-07: 10 mL/h via INTRAVENOUS

## 2011-11-07 MED ORDER — PANTOPRAZOLE SODIUM 40 MG PO TBEC
40.0000 mg | DELAYED_RELEASE_TABLET | Freq: Every day | ORAL | Status: DC
  Administered 2011-11-08: 40 mg via ORAL
  Filled 2011-11-07 (×3): qty 1

## 2011-11-07 MED ORDER — DIPHENHYDRAMINE HCL 50 MG/ML IJ SOLN
INTRAMUSCULAR | Status: AC
  Filled 2011-11-07: qty 1

## 2011-11-07 MED ORDER — INSULIN ASPART 100 UNIT/ML ~~LOC~~ SOLN
0.0000 [IU] | SUBCUTANEOUS | Status: DC
  Administered 2011-11-07: 1 [IU] via SUBCUTANEOUS
  Administered 2011-11-07: 2 [IU] via SUBCUTANEOUS
  Administered 2011-11-07: 3 [IU] via SUBCUTANEOUS
  Administered 2011-11-08: 1 [IU] via SUBCUTANEOUS

## 2011-11-07 MED ORDER — MIDAZOLAM HCL 10 MG/2ML IJ SOLN
INTRAMUSCULAR | Status: DC | PRN
  Administered 2011-11-07 (×2): 2 mg via INTRAVENOUS

## 2011-11-07 MED ORDER — PANTOPRAZOLE SODIUM 40 MG IV SOLR
8.0000 mg/h | INTRAVENOUS | Status: DC
  Administered 2011-11-07: 8 mg/h via INTRAVENOUS
  Filled 2011-11-07 (×2): qty 80

## 2011-11-07 MED ORDER — ONDANSETRON HCL 4 MG/2ML IJ SOLN: 4.0000 mg | Freq: Four times a day (QID) | INTRAMUSCULAR | Status: DC | PRN

## 2011-11-07 MED ORDER — SODIUM CHLORIDE 0.9 % IV SOLN
80.0000 mg | Freq: Once | INTRAVENOUS | Status: AC
  Administered 2011-11-07: 80 mg via INTRAVENOUS
  Filled 2011-11-07: qty 80

## 2011-11-07 MED ORDER — BUTAMBEN-TETRACAINE-BENZOCAINE 2-2-14 % EX AERO
INHALATION_SPRAY | CUTANEOUS | Status: DC | PRN
  Administered 2011-11-07: 2 via TOPICAL

## 2011-11-07 MED ORDER — ONDANSETRON HCL 4 MG PO TABS: 4.0000 mg | ORAL_TABLET | Freq: Four times a day (QID) | ORAL | Status: DC | PRN

## 2011-11-07 MED ORDER — FENTANYL NICU IV SYRINGE 50 MCG/ML
INJECTION | INTRAMUSCULAR | Status: DC | PRN
  Administered 2011-11-07 (×2): 25 ug via INTRAVENOUS

## 2011-11-07 MED ORDER — MIDAZOLAM HCL 10 MG/2ML IJ SOLN
INTRAMUSCULAR | Status: AC
  Filled 2011-11-07: qty 2

## 2011-11-07 MED ORDER — METOPROLOL TARTRATE 1 MG/ML IV SOLN: 5.0000 mg | INTRAVENOUS | Status: DC | PRN

## 2011-11-07 MED ORDER — FENTANYL CITRATE 0.05 MG/ML IJ SOLN
INTRAMUSCULAR | Status: AC
  Filled 2011-11-07: qty 2

## 2011-11-07 MED ORDER — PHYTONADIONE 5 MG PO TABS
5.0000 mg | ORAL_TABLET | Freq: Once | ORAL | Status: AC
  Administered 2011-11-07: 5 mg via ORAL
  Filled 2011-11-07: qty 1

## 2011-11-07 MED ORDER — POTASSIUM CHLORIDE IN NACL 20-0.9 MEQ/L-% IV SOLN
INTRAVENOUS | Status: DC
  Administered 2011-11-07 (×2): 50 mL/h via INTRAVENOUS
  Filled 2011-11-07: qty 1000

## 2011-11-07 MED ORDER — POTASSIUM CHLORIDE IN NACL 20-0.9 MEQ/L-% IV SOLN
INTRAVENOUS | Status: DC
  Administered 2011-11-07: 02:00:00 via INTRAVENOUS
  Filled 2011-11-07 (×2): qty 1000

## 2011-11-07 MED ORDER — VITAMIN K1 10 MG/ML IJ SOLN
3.0000 mg | Freq: Once | INTRAVENOUS | Status: AC
  Administered 2011-11-07: 3 mg via INTRAVENOUS
  Filled 2011-11-07: qty 0.3

## 2011-11-07 NOTE — H&P (Signed)
PCP:   [name], MD, MD   Chief Complaint:  Black stool  HPI: 56 year old female who is status post CVA with residual left-sided hemiparesis who is on Coumadin presents emergency department with 2 day history of melanotic stools. She's currently is sedated in the emergency department her history is unreliable. She does have a son who does not know recent history so history is obtained from emergency room physician. Return to 2 days of melanotic stools was heme positive from below. No nausea or vomiting. She currently denies any pain but he is sedated from recent IV pain medicine. Has a history of colon cancer in 2003 status post resection and chemotherapy.  Review of Systems:  Otherwise negative  Past Medical History: Past Medical History  Diagnosis Date  . Diabetes mellitus   . Colon cancer   . Stroke    Past Surgical History  Procedure Date  . Colon surgery     Medications: Prior to Admission medications   Medication Sig Start Date End Date Taking? Authorizing Provider  ferrous sulfate 325 (65 FE) MG tablet Take 325 mg by mouth 2 (two) times daily.   Yes Historical Provider, MD  insulin aspart (NOVOLOG) 100 UNIT/ML injection Inject 10-35 Units into the skin See admin instructions. Takes 10 units every night and 34 units every morning   Yes Historical Provider, MD  metFORMIN (GLUCOPHAGE) 1000 MG tablet Take 1,000 mg by mouth 2 (two) times daily with a meal.   Yes Historical Provider, MD  vitamin B-12 (CYANOCOBALAMIN) 1000 MCG tablet Take 1,000 mcg by mouth daily.   Yes Historical Provider, MD  warfarin (COUMADIN) 1 MG tablet Take 0.5 mg by mouth See admin instructions. Takes 1/2 tablet along with 5mg  tablet only on tuesdays, and thursdays   Yes Historical Provider, MD  warfarin (COUMADIN) 5 MG tablet Take 5 mg by mouth See admin instructions. Takes 1 tablet daily on tuesdays and thursdays for 5mg  dosage, and takes 1 tablet along with 1/2 tablet of 1mg  tablet on every day  exdcept tuesdays and thursdays   Yes Historical Provider, MD    Allergies:   Allergies  Allergen Reactions  . Glipizide     REACTION: nausea and vomiting  . Hydrocodone-Acetaminophen     REACTION: vomiting and rash  . Ibuprofen     REACTION: tongue swells and dyspnea  . Iohexol      Code: HIVES, Desc: pt called day after IV contrast c/o red, swollen itching feet and red face that started 4hrs after ct, Onset Date: [id number]   . Morphine     REACTION: rash  . Rosiglitazone-Metformin     REACTION: rash--tolerates Metformin alone  . Sulfamethoxazole     REACTION: rash    Social History:  reports that she has never smoked. She does not have any smokeless tobacco history on file. She reports that she does not drink alcohol. Her drug history not on file.   Physical Exam: Filed Vitals:   11/06/11 1950 11/06/11 2357  BP: 125/71 111/61  Pulse: 72 69  Temp: 97.9 F (36.6 C) 97.7 F (36.5 C)  TempSrc: Oral Oral  Resp:  17  SpO2: 97% 94%   General appearance: cooperative and no distress arouses to voice easily Lungs: clear to auscultation bilaterally Heart: regular rate and rhythm, S1, S2 normal, no murmur, click, rub or gallop Abdomen: soft, non-tender; bowel sounds normal; no masses,  no organomegaly Extremities: extremities normal, atraumatic, no cyanosis or edema Pulses: 2+ and symmetric Skin: Skin  color, texture, turgor normal. No rashes or lesions Neurologic: Grossly normal    Labs on Admission:   [facility] 11/06/11 2337  NA 138  K 3.6  CL 105  CO2 23  GLUCOSE 217*  BUN 10  CREATININE 0.54  CALCIUM 8.9  MG --  PHOS --    Basename 11/06/11 2337  AST 13  ALT 12  ALKPHOS 94  BILITOT 0.3  PROT 7.1  ALBUMIN 3.7    Basename 11/06/11 2337  WBC 6.4  NEUTROABS 3.5  HGB 11.8*  HCT 35.9*  MCV 77.2*  PLT 240    Radiological Exams on Admission: No results found.  Assessment/Plan Present on Admission:  56 year old female who has coagulopathy from  Coumadin with melanotic stools  .Melena .Coagulopathy .HYPERTENSION .Unspecified cerebral artery occlusion with cerebral infarction .H/O colon cancer  Give the patient is a vitamin K.  hemoglobin every 8 hours.  Will need to call GI consultation in the morning  Velda Wendt A [phone] 11/07/2011, 1:29 AM

## 2011-11-07 NOTE — ED Notes (Signed)
Attempted to call report. Floor RN unable to accept report.  

## 2011-11-07 NOTE — Care Management Note (Unsigned)
    Page 1 of 1   11/07/2011     9:16:38 AM   CARE MANAGEMENT NOTE 11/07/2011  Patient:  [name], [name]   Account Number:  [id number]  Date Initiated:  11/07/2011  Documentation initiated by:  Lanier Clam  Subjective/Objective Assessment:   ADMITTED W/MELENA.HX:CVA     Action/Plan:   FROM HOME.   Anticipated DC Date:  11/11/2011   Anticipated DC Plan:  HOME/SELF CARE      DC Planning Services  CM consult      Choice offered to / List presented to:             Status of service:  In process, will continue to follow Medicare Important Message given?   (If response is "NO", the following Medicare IM given date fields will be blank) Date Medicare IM given:   Date Additional Medicare IM given:    Discharge Disposition:    Per UR Regulation:  Reviewed for med. necessity/level of care/duration of stay  If discussed at Long Length of Stay Meetings, dates discussed:    Comments:  11/07/11 [facility] RN,BSN NCM [phone]

## 2011-11-07 NOTE — Consult Note (Signed)
Patient seen, examined, and I agree with the above documentation, including the assessment and plan. Overall, it is felt the most likely cause of her nausea, vomiting, and loose stools with generalized abdominal pain is secondary to gastroenteritis. She does take iron and has chronically black stools. At this point her hemoglobin is stable, but we will perform EGD to exclude an upper GI bleeding source. She has a history of colon cancer status post resection but is up-to-date on her colonoscopy, last being January 2012. Further recommendations after upper endoscopy.

## 2011-11-07 NOTE — Progress Notes (Signed)
[facility]                                                                                 Patient Demographics  [name], is a 56 y.o. female  ZOX:[id number]  [id number]  DOB - Feb 01, 1956  Admit date - 11/06/2011  Admitting Physician Jenna Monica, MD  Outpatient Primary MD for the patient is [facility], MD, MD  LOS - 1    Chief Complaint  Patient presents with  . Melena  . Abdominal Pain        Subjective:   [name] today has, No headache, No chest pain, No abdominal pain - No Nausea, No new weakness tingling or numbness, No Cough - SOB. No more melanotic stools since admission.  Objective:   Filed Vitals:   11/06/11 1950 11/06/11 2357 11/07/11 0257 11/07/11 0413  BP: 125/71 111/61 111/42 95/61  Pulse: 72 69 71 73  Temp: 97.9 F (36.6 C) 97.7 F (36.5 C) 97.8 F (36.6 C) 97.5 F (36.4 C)  TempSrc: Oral Oral Oral Oral  Resp:  17 17 18   Height:    5\' 4"  (1.626 m)  Weight:    79.561 kg (175 lb 6.4 oz)  SpO2: 97% 94% 94% 96%    Wt Readings from Last 3 Encounters:  11/07/11 79.561 kg (175 lb 6.4 oz)  11/07/11 79.561 kg (175 lb 6.4 oz)  08/27/10 77.253 kg (170 lb 5 oz)     Intake/Output Summary (Last 24 hours) at 11/07/11 1127 Last data filed at 11/07/11 0600  Gross per 24 hour  Intake    500 ml  Output      0 ml  Net    500 ml    Exam Awake Alert, Oriented *3, No new F.N deficits, Normal affect [name],PERRAL Supple Neck,No JVD, No cervical lymphadenopathy appriciated.  Symmetrical Chest wall movement, Good air movement bilaterally, CTAB RRR,No Gallops,Rubs or new Murmurs, No Parasternal Heave +ve B.Sounds, Abd Soft, Non tender, No organomegaly appriciated, No rebound -guarding or rigidity. No Cyanosis, Clubbing or edema, No new Rash or bruise    Data Review  CBC  Lab 11/07/11 0943 11/07/11 0200 11/06/11 2337  WBC -- -- 6.4  HGB 10.4* 10.6* 11.8*  HCT 32.0* 31.9* 35.9*  PLT -- -- 240  MCV  -- -- 77.2*  MCH -- -- 25.4*  MCHC -- -- 32.9  RDW -- -- 14.8  LYMPHSABS -- -- 2.3  MONOABS -- -- 0.4  EOSABS -- -- 0.2  BASOSABS -- -- 0.0  BANDABS -- -- --    Chemistries   Lab 11/06/11 2337  NA 138  K 3.6  CL 105  CO2 23  GLUCOSE 217*  BUN 10  CREATININE 0.54  CALCIUM 8.9  MG --  AST 13  ALT 12  ALKPHOS 94  BILITOT 0.3   ------------------------------------------------------------------------------------------------------------------ estimated creatinine clearance is 82.1 ml/min (by C-G formula based on Cr of 0.54). ------------------------------------------------------------------------------------------------------------------ No results found for this basename: HGBA1C:2 in the last 72 hours ------------------------------------------------------------------------------------------------------------------ No results found for this basename: CHOL:2,HDL:2,LDLCALC:2,TRIG:2,CHOLHDL:2,LDLDIRECT:2 in the last 72 hours ------------------------------------------------------------------------------------------------------------------ No results found for this basename: TSH,T4TOTAL,FREET3,T3FREE,THYROIDAB in the last 72 hours ------------------------------------------------------------------------------------------------------------------ No results  found for this basename: VITAMINB12:2,FOLATE:2,FERRITIN:2,TIBC:2,IRON:2,RETICCTPCT:2 in the last 72 hours  Coagulation profile  Lab 11/07/11 0943 11/06/11 2337  INR 3.95* 3.85*  PROTIME -- --    No results found for this basename: DDIMER:2 in the last 72 hours  Cardiac Enzymes No results found for this basename: CK:3,CKMB:3,TROPONINI:3,MYOGLOBIN:3 in the last 168 hours ------------------------------------------------------------------------------------------------------------------ No components found with this basename: POCBNP:3  Micro Results No results found for this or any previous visit (from the past 240  hour(s)).  Radiology Reports No results found.  Scheduled Meds:   . insulin aspart  0-9 Units Subcutaneous Q4H  . LORazepam  1 mg Intravenous Once  . pantoprazole (PROTONIX) IV  80 mg Intravenous Once  . phytonadione (VITAMIN K) IV  3 mg Intravenous Once  . phytonadione  5 mg Oral Once  . sodium chloride  1,000 mL Intravenous Once  . DISCONTD: HYDROmorphone  1 mg Intravenous Once   Continuous Infusions:   . 0.9 % NaCl with KCl 20 mEq / L 50 mL/hr (11/07/11 0937)  . pantoprozole (PROTONIX) infusion 8 mg/hr (11/07/11 0935)  . DISCONTD: 0.9 % NaCl with KCl 20 mEq / L 100 mL/hr at 11/07/11 0224   PRN Meds:.ondansetron (ZOFRAN) IV, ondansetron  Assessment & Plan    1. Melena the patient was history of colon cancer who is on Coumadin for CVA, INR was supratherapeutic - H&H is stable, melanotic stools have stopped, patient is pain-free symptom-free, INR is still elevated we'll give 3 more milligrams of IV vitamin K, if H&H drops or melena reoccurs FFP will be given. GI has already been called to see the patient soon. Have initiated IV PPI. The new to keep n.p.o. Likely EGD later today.   2. DM type II - will place her on every 4 hour Accu-Cheks with sliding scale insulin and monitor CBGs, A1c will be checked.   3. History of hypertension- will monitor when necessary IV Lopressor as needed since patient is n.p.o..   4. History of CVA with chronic left-sided weakness- no acute issues no aspirin Plavix or Coumadin for now obviously due to #1 above, once stable this will be addressed. Of note patient is on Coumadin due to history of CVA.    DVT Prophylaxis  SCDs   Procedures likely EGD on 11/07/2011.  Consults GI   Leroy Sea M.D on 11/07/2011 at 11:27 AM  Between 7am to 7pm - Pager - [phone]  After 7pm go to [email or web address] - password [email or web address]  And look for the night coverage person covering for me after hours  [facility] Office  [phone]

## 2011-11-07 NOTE — Consult Note (Signed)
[facility] Consultation  Referring Provider: [facility] Primary Care Physician:  [name], MD, MD Primary Gastroenterologist:   Lina Sar, MD Reason for Consultation:  melena  HPI: [name] is a 56 y.o. female known to Dr. Juanda Chance for history of colon cancer, s/p resection and chemo rx, in 2002. Patient is on chronic Coumadin for CVA . She was admitted last night for melena and abdominal pain. INR is 3.85. Patient gives a one week history of postprandial loose stools, nausea, vomiting and diffuse abdominal pain. Emesis non-bloody. Stools chronically black on iron but yesterday patient saw some dark red blood in her stool. No BMs or bleeding since late yesterday evening.   Past Medical History  Diagnosis Date  . Diabetes mellitus   . Colon cancer   . Stroke     Past Surgical History  Procedure Date  . Colon surgery     Prior to Admission medications   Medication Sig Start Date End Date Taking? Authorizing Provider  ferrous sulfate 325 (65 FE) MG tablet Take 325 mg by mouth 2 (two) times daily.   Yes Historical Provider, MD  insulin aspart (NOVOLOG) 100 UNIT/ML injection Inject 10-35 Units into the skin See admin instructions. Takes 10 units every night and 34 units every morning   Yes Historical Provider, MD  metFORMIN (GLUCOPHAGE) 1000 MG tablet Take 1,000 mg by mouth 2 (two) times daily with a meal.   Yes Historical Provider, MD  vitamin B-12 (CYANOCOBALAMIN) 1000 MCG tablet Take 1,000 mcg by mouth daily.   Yes Historical Provider, MD  warfarin (COUMADIN) 1 MG tablet Take 0.5 mg by mouth See admin instructions. Takes 1/2 tablet along with 5mg  tablet only on tuesdays, and thursdays   Yes Historical Provider, MD  warfarin (COUMADIN) 5 MG tablet Take 5 mg by mouth See admin instructions. Takes 1 tablet daily on tuesdays and thursdays for 5mg  dosage, and takes 1 tablet along with 1/2 tablet of 1mg  tablet on every day exdcept tuesdays and  thursdays   Yes Historical Provider, MD    Current Facility-Administered Medications  Medication Dose Route Frequency Provider Last Rate Last Dose  . 0.9 % NaCl with KCl 20 mEq/ L  infusion   Intravenous Continuous Leroy Sea, MD      . LORazepam (ATIVAN) injection 1 mg  1 mg Intravenous Once Toy Baker, MD   1 mg at 11/06/11 2348  . ondansetron (ZOFRAN) tablet 4 mg  4 mg Oral Q6H PRN Haydee Monica, MD       Or  . ondansetron (ZOFRAN) injection 4 mg  4 mg Intravenous Q6H PRN Haydee Monica, MD      . pantoprazole (PROTONIX) 80 mg in sodium chloride 0.9 % 100 mL IVPB  80 mg Intravenous Once Leroy Sea, MD      . pantoprazole (PROTONIX) 80 mg in sodium chloride 0.9 % 250 mL infusion  8 mg/hr Intravenous Continuous Leroy Sea, MD      . phytonadione (VITAMIN K) tablet 5 mg  5 mg Oral Once Haydee Monica, MD   5 mg at 11/07/11 0224  . sodium chloride 0.9 % bolus 1,000 mL  1,000 mL Intravenous Once Gwyneth Sprout, MD   1,000 mL at 11/06/11 2332  . DISCONTD: 0.9 % NaCl with KCl 20 mEq/ L  infusion   Intravenous Continuous Haydee Monica, MD 100 mL/hr at 11/07/11 0224    . DISCONTD: HYDROmorphone (DILAUDID) injection 1 mg  1 mg Intravenous  Once Toy Baker, MD        Allergies as of 11/06/2011 - Review Complete 11/06/2011  Allergen Reaction Noted  . Glipizide  11/25/2006  . Hydrocodone-acetaminophen  11/25/2006  . Ibuprofen  11/25/2006  . Iohexol  06/20/2010  . Morphine  11/25/2006  . Rosiglitazone-metformin  11/25/2006  . Sulfamethoxazole  11/25/2006    No family history on file.  History   Social History  . Marital Status: Single    Spouse Name: N/A    Number of Children: N/A  . Years of Education: N/A   Occupational History  . Not on file.   Social History Main Topics  . Smoking status: Never Smoker   . Smokeless tobacco: Not on file  . Alcohol Use: No  . Drug Use:   . Sexually Active:     Review of Systems: All systems reviewed and  negative except where noted in HPI  PHYSICAL EXAM: Vital signs in last 24 hours: Temp:  [97.5 F (36.4 C)-97.9 F (36.6 C)] 97.5 F (36.4 C) (06/07 0413) Pulse Rate:  [69-73] 73  (06/07 0413) Resp:  [17-18] 18  (06/07 0413) BP: (95-125)/(42-71) 95/61 mmHg (06/07 0413) SpO2:  [94 %-97 %] 96 % (06/07 0413) Weight:  [175 lb 6.4 oz (79.561 kg)] 175 lb 6.4 oz (79.561 kg) (06/07 0413) Last BM Date: 11/06/11 General:   Pleasant white female in NAD Head:  Normocephalic and atraumatic. Eyes:   No icterus.   Conjunctiva pink. Ears:  Normal auditory acuity. Neck:  Supple; no masses felt Lungs:  Respirations even and unlabored. Lungs clear to auscultation bilaterally.   No wheezes, crackles, or rhonchi.  Heart:  Regular rate and rhythm; no murmurs heard. Abdomen:  Soft, nondistended, nontender. Normal bowel sounds. No appreciable masses or hepatomegaly.  Rectal:  No external lesions. Scant dry black material in vault   Msk:  Symmetrical without gross deformities.  Extremities:  Without edema. Neurologic:  Alert and  oriented;  grossly normal neurologically. Skin:  Intact without significant lesions or rashes. Cervical Nodes:  No significant cervical adenopathy. Psych:  Alert and cooperative. Normal affect.  LAB RESULTS:  Basename 11/07/11 0200 11/06/11 2337  WBC -- 6.4  HGB 10.6* 11.8*  HCT 31.9* 35.9*  PLT -- 240   BMET  Basename 11/06/11 2337  NA 138  K 3.6  CL 105  CO2 23  GLUCOSE 217*  BUN 10  CREATININE 0.54  CALCIUM 8.9   LFT  Basename 11/06/11 2337  PROT 7.1  ALBUMIN 3.7  AST 13  ALT 12  ALKPHOS 94  BILITOT 0.3  BILIDIR --  IBILI --   PT/INR  Basename 11/06/11 2337  LABPROT 38.4*  INR 3.85*     PREVIOUS ENDOSCOPIES: Colonoscopy January 2012 Juanda Chance)  IMPRESSION / PLAN: 1. One week history of nausea, vomiting, loose stools and generalized abdominal pain. This may be gastroenteritis. Her abdominal exam is not concerning, WBC normal and she is  afebrile.   2. Black stool, chronic (on iron). Doubt upper GI bleed (or significant one). Her BUN is normal, she is hemodynamically stable and her hemoglobin is 10.6 (about baseline for her). Having said that, patient is on chronic coumadin and we need to make exclude upper GI with certainty. Will plan for EGD to be done later today. If negative, discontinue PPI drip.   3. Rectal bleeding, isolated episode yesterday. This could be perianal bleeding after having loose stools over the last week. She is up to date on colonoscopy.  4.  History of sigmoid colon cancer, s/p resection and chemo in 2002.   Thanks   LOS: 1 day   Willette Cluster  11/07/2011, 9:01 AM

## 2011-11-07 NOTE — Op Note (Addendum)
[facility] [address] [place], [place]  [address]  ENDOSCOPY PROCEDURE REPORT  PATIENT:  [name], [name]  MR#:  [id number] BIRTHDATE:  24-Aug-1955, 55 yrs. old  GENDER:  female ENDOSCOPIST:  Carie Caddy. Kealey Kemmer, MD Referred by:  [facility], PROCEDURE DATE:  11/07/2011 PROCEDURE:  EGD, diagnostic 43235 ASA CLASS:  Class III INDICATIONS:  epigastric pain, question of melenic stools, on chronic iron therapy, nausea and vomiting MEDICATIONS:   Fentanyl 50 mcg IV, Versed 4 mg IV TOPICAL ANESTHETIC:  Cetacaine Spray  DESCRIPTION OF PROCEDURE:   After the risks benefits and alternatives of the procedure were thoroughly explained, informed consent was obtained.  The [id number] ([id number]) endoscope was introduced through the mouth and advanced to the second portion of the duodenum, without limitations.  The instrument was slowly withdrawn as the mucosa was fully examined. <<PROCEDUREIMAGES>> The esophagus and gastroesophageal junction were completely normal in appearance.  Mild, patchy gastritis characterized by erythema was found throughout the stomach. No ulcers or erosions seen. 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 Biopsy was not performed due to supratherapeutic INR. The duodenal bulb was normal in appearance, as was the postbulbar duodenum.    Retroflexed views revealed no abnormalities.    The scope was then withdrawn from the patient and the procedure completed.  COMPLICATIONS:  None  ENDOSCOPIC IMPRESSION: 1) Normal esophagus 2) Mild gastritis, scattered throughout the stomach. 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 3) Normal duodenum  RECOMMENDATIONS: 1) Continue daily PPI therapy. 2) Supportive care for presumed gastroenteritis. 3) Follow-up with Dr. Juanda Chance after discharge if any further rectal bleeding.  Carie Caddy. Spencer Peterkin, MD  CC:  The Patient  n. REVISED:  11/07/2011  03:56 PM eSIGNED:   Carie Caddy. Tayton Decaire at 11/07/2011 03:56 PM  [name], [phone]

## 2011-11-07 NOTE — Progress Notes (Signed)
Inpatient Diabetes Program Recommendations  AACE/ADA: New Consensus Statement on Inpatient Glycemic Control (2009)  Target Ranges:  Prepandial:   less than 140 mg/dL      Peak postprandial:   less than 180 mg/dL (1-2 hours)      Critically ill patients:  140 - 180 mg/dL    Results for DECIE, VERNE (MRN [id number]) as of 11/07/2011 10:50  Ref. Range 11/06/2011 23:37  Glucose Latest Range: 70-99 mg/dL 409 (H)   Patient with history of DM Type 2.  Takes Metformin and Novolog at home per records.  Inpatient Diabetes Program Recommendations Correction (SSI): Please check CBGs and start Novolog Moderate correction scale (SSI) Q4 hours.  Note: Will follow. Ambrose Finland RN, MSN, CDE Diabetes Coordinator Inpatient Diabetes Program [phone]

## 2011-11-07 NOTE — Progress Notes (Signed)
Pt's gag reflex is intact.  Pt allowed to start diet.

## 2011-11-08 DIAGNOSIS — D684 Acquired coagulation factor deficiency: Secondary | ICD-10-CM

## 2011-11-08 DIAGNOSIS — C189 Malignant neoplasm of colon, unspecified: Secondary | ICD-10-CM

## 2011-11-08 DIAGNOSIS — K921 Melena: Secondary | ICD-10-CM

## 2011-11-08 DIAGNOSIS — G459 Transient cerebral ischemic attack, unspecified: Secondary | ICD-10-CM

## 2011-11-08 LAB — BASIC METABOLIC PANEL
BUN: 8 mg/dL (ref 6–23)
CO2: 24 mEq/L (ref 19–32)
Calcium: 8.5 mg/dL (ref 8.4–10.5)
Chloride: 109 mEq/L (ref 96–112)
Creatinine, Ser: 0.58 mg/dL (ref 0.50–1.10)
GFR calc Af Amer: >90 mL/min (ref >90)
GFR calc non Af Amer: >90 mL/min (ref >90)
Glucose, Bld: 157 mg/dL — ABNORMAL HIGH (ref 70–99)
Potassium: 3.5 mEq/L (ref 3.5–5.1)
Sodium: 143 mEq/L (ref 135–145)

## 2011-11-08 LAB — PROTIME-INR
INR: 1.51 — ABNORMAL HIGH (ref 0.00–1.49)
Prothrombin Time: 18.5 seconds — ABNORMAL HIGH (ref 11.6–15.2)

## 2011-11-08 LAB — CBC
HCT: 31.7 % — ABNORMAL LOW (ref 36.0–46.0)
Hemoglobin: 10.3 g/dL — ABNORMAL LOW (ref 12.0–15.0)
MCH: 25.4 pg — ABNORMAL LOW (ref 26.0–34.0)
MCHC: 32.5 g/dL (ref 30.0–36.0)
MCV: 78.3 fL (ref 78.0–100.0)
Platelets: 199 10*3/uL (ref 150–400)
RBC: 4.05 MIL/uL (ref 3.87–5.11)
RDW: 14.7 % (ref 11.5–15.5)
WBC: 5 10*3/uL (ref 4.0–10.5)

## 2011-11-08 LAB — HEMOGLOBIN AND HEMATOCRIT, BLOOD
HCT: 31.9 % — ABNORMAL LOW (ref 36.0–46.0)
HCT: 33.8 % — ABNORMAL LOW (ref 36.0–46.0)
Hemoglobin: 10.5 g/dL — ABNORMAL LOW (ref 12.0–15.0)

## 2011-11-08 LAB — GLUCOSE, CAPILLARY: Glucose-Capillary: 112 mg/dL — ABNORMAL HIGH (ref 70–99)

## 2011-11-08 MED ORDER — PANTOPRAZOLE SODIUM 40 MG PO TBEC: 40.0000 mg | DELAYED_RELEASE_TABLET | Freq: Every day | ORAL | Status: DC

## 2011-11-08 NOTE — Discharge Summary (Signed)
[facility]                                                                                   [name], is a 56 y.o. female  DOB 1956/02/11  MRN [id number].  Admission date:  11/06/2011  Discharge Date:  11/08/2011  Primary MD  [name], MD, MD  Admitting Physician  Haydee Monica, MD  Admission Diagnosis  GI bleeding [578.9] MELENA; ABD PAIN black stool, nausea , vomiting  Discharge Diagnosis     Principal Problem:  *Melena Active Problems:  HYPERTENSION  Unspecified cerebral artery occlusion with cerebral infarction  Coagulopathy  H/O colon cancer     Past Medical History  Diagnosis Date  . Diabetes mellitus   . Stroke   . Colon cancer     Past Surgical History  Procedure Date  . Colon surgery      Hospital Course See H&P, Labs, Consult and Test reports for all details in brief, patient was admitted for angina caused by a combination of gastritis, gastric xanthelasma combination of supratherapeutic INR. Patient was treated with white milky IVs with INR being brought to 1.5, subsequently melena resolved patient's H&H remained stable around 10.5 for 24 hours, no hemodynamic compromise, she underwent EGD by Dr. Sharla Kidney, I discussed the case with him this morning patient is okay to be discharged on oral PPI. Coumadin to be held patient will follow with her GI physician Dr. Juanda Chance in a week and then resume Coumadin if okay by her.  And is on Coumadin for history of CVA/   For history of  diabetes mellitus type 2 she will resume her home medications as before.  She will follow with her PCP in 2 days and she will get a repeat CBC and BMP at that time.   Consults  GI  Significant Tests:  See full reports for all details    EGD  ENDOSCOPIC IMPRESSION:  1) Normal esophagus  2) Mild gastritis, scattered throughout the stomach. 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  3) Normal duodenum  RECOMMENDATIONS:  1)  Continue daily PPI therapy.  2) Supportive care for presumed gastroenteritis.  3) Follow-up with Dr. Juanda Chance after discharge if any further  rectal bleeding.   Carie Caddy. Pyrtle.    Today   Subjective:   [name] today has no headache,no chest abdominal pain,no new weakness tingling or numbness, feels much better wants to go home today.    Objective:   Blood pressure 102/67, pulse 71, temperature 97.5 F (36.4 C), temperature source Oral, resp. rate 18, height 5\' 4"  (1.626 m), weight 79.561 kg (175 lb 6.4 oz), SpO2 93.00%.  Intake/Output Summary (Last 24 hours) at 11/08/11 0912 Last data filed at 11/08/11 0830  Gross per 24 hour  Intake 715.42 ml  Output      0 ml  Net 715.42 ml    Exam Awake Alert, Oriented *3, No new F.N deficits, Normal affect [place].AT,PERRAL Supple Neck,No JVD, No cervical lymphadenopathy appriciated.  Symmetrical Chest wall movement, Good air movement bilaterally, CTAB RRR,No Gallops,Rubs or new Murmurs, No Parasternal Heave +ve B.Sounds, Abd Soft, Non tender, No organomegaly  appriciated, No rebound -guarding or rigidity. No Cyanosis, Clubbing or edema, No new Rash or bruise  Data Review     CBC w Diff: Lab Results  Component Value Date   WBC 5.0 11/08/2011   HGB 10.3* 11/08/2011   HCT 31.7* 11/08/2011   PLT 199 11/08/2011   LYMPHOPCT 36 11/06/2011   MONOPCT 6 11/06/2011   EOSPCT 3 11/06/2011   BASOPCT 0 11/06/2011    CMP: Lab Results  Component Value Date   NA 143 11/08/2011   K 3.5 11/08/2011   CL 109 11/08/2011   CO2 24 11/08/2011   BUN 8 11/08/2011   CREATININE 0.58 11/08/2011   PROT 7.1 11/06/2011   ALBUMIN 3.7 11/06/2011   BILITOT 0.3 11/06/2011   ALKPHOS 94 11/06/2011   AST 13 11/06/2011   ALT 12 11/06/2011  .   Discharge Instructions     Follow with Primary MD [name], MD, MD in 2 days   Get CBC, CMP, INR checked 2 days by Primary MD and again as instructed by your Primary MD.  Resume Coumadin only after you have clear to do so by the  stomach doctor Dr Juanda Chance.  Get Medicines reviewed and adjusted.  Please request your Prim.MD to go over all Hospital Tests and Procedure/Radiological results at the follow up, please get all Hospital records sent to your Prim MD by signing hospital release before you go home.  Activity: As tolerated with Full fall precautions use walker/cane & assistance as needed  Diet: Heart Healthy -Low Carb , Aspiration precautions.  For Heart failure patients - Check your Weight same time everyday, if you gain over 2 pounds, or you develop in leg swelling, experience more shortness of breath or chest pain, call your Primary MD immediately. Follow Cardiac Low Salt Diet and 1.8 lit/day fluid restriction.  Disposition Home  If you experience worsening of your admission symptoms, develop shortness of breath, life threatening emergency, suicidal or homicidal thoughts you must seek medical attention immediately by calling 911 or calling your MD immediately  if symptoms less severe.  You Must read complete instructions/literature along with all the possible adverse reactions/side effects for all the Medicines you take and that have been prescribed to you. Take any new Medicines after you have completely understood and accpet all the possible adverse reactions/side effects.   Do not drive if your were admitted for syncope or siezures until you have seen by Primary MD or a Neurologist and advised to drive.  Do not drive when taking Pain medications.    Do not take more than prescribed Pain, Sleep and Anxiety Medications  Special Instructions: If you have smoked or chewed Tobacco  in the last 2 yrs please stop smoking, stop any regular Alcohol  and or any Recreational drug use.  Wear Seat belts while driving.  Follow-up Information    Follow up with [facility], MD in 2 days.   Contact information:   Nurse, [facility]'s [address] [place] [address] [phone]        Follow up with Lina Sar, MD. Schedule an appointment as soon as possible for a visit in 1 week.   Contact information:   [address] [facility] [address] [place] [address] [place] [address] [phone]          Discharge Medications    [name], [name]  Home Medication Instructions VHQ:[id number]   Printed on:11/08/11 [phone]  Medication Information  insulin aspart (NOVOLOG) 100 UNIT/ML injection Inject 10-35 Units into the skin See admin instructions. Takes 10 units every night and 34 units every morning           metFORMIN (GLUCOPHAGE) 1000 MG tablet Take 1,000 mg by mouth 2 (two) times daily with a meal.           vitamin B-12 (CYANOCOBALAMIN) 1000 MCG tablet Take 1,000 mcg by mouth daily.           ferrous sulfate 325 (65 FE) MG tablet Take 325 mg by mouth 2 (two) times daily.           pantoprazole (PROTONIX) 40 MG tablet Take 1 tablet (40 mg total) by mouth daily at 6 (six) AM.              Total Time in preparing paper work, data evaluation and todays exam - 35 minutes  Leroy Sea M.D on 11/08/2011 at 9:12 AM  [facility] Office  [phone]

## 2011-11-08 NOTE — Discharge Instructions (Signed)
Follow with Primary MD [name], MD, MD in 2 days   Get CBC, CMP, INR checked 2 days by Primary MD and again as instructed by your Primary MD.  Resume Coumadin only after you have clear to do so by the stomach doctor Dr Juanda Chance.  Get Medicines reviewed and adjusted.  Please request your Prim.MD to go over all Hospital Tests and Procedure/Radiological results at the follow up, please get all Hospital records sent to your Prim MD by signing hospital release before you go home.  Activity: As tolerated with Full fall precautions use walker/cane & assistance as needed  Diet: Heart Healthy -Low Carb , Aspiration precautions.  For Heart failure patients - Check your Weight same time everyday, if you gain over 2 pounds, or you develop in leg swelling, experience more shortness of breath or chest pain, call your Primary MD immediately. Follow Cardiac Low Salt Diet and 1.8 lit/day fluid restriction.  Disposition Home  If you experience worsening of your admission symptoms, develop shortness of breath, life threatening emergency, suicidal or homicidal thoughts you must seek medical attention immediately by calling 911 or calling your MD immediately  if symptoms less severe.  You Must read complete instructions/literature along with all the possible adverse reactions/side effects for all the Medicines you take and that have been prescribed to you. Take any new Medicines after you have completely understood and accpet all the possible adverse reactions/side effects.   Do not drive if your were admitted for syncope or siezures until you have seen by Primary MD or a Neurologist and advised to drive.  Do not drive when taking Pain medications.    Do not take more than prescribed Pain, Sleep and Anxiety Medications  Special Instructions: If you have smoked or chewed Tobacco  in the last 2 yrs please stop smoking, stop any regular Alcohol  and or any Recreational drug use.  Wear Seat belts while  driving.

## 2011-11-10 ENCOUNTER — Encounter (HOSPITAL_COMMUNITY): Payer: Self-pay | Admitting: Internal Medicine

## 2011-11-10 ENCOUNTER — Encounter (HOSPITAL_COMMUNITY): Payer: Self-pay

## 2011-12-18 ENCOUNTER — Other Ambulatory Visit: Payer: Self-pay | Admitting: Family

## 2012-01-21 ENCOUNTER — Telehealth: Payer: Self-pay | Admitting: Internal Medicine

## 2012-01-21 NOTE — Telephone Encounter (Signed)
Sorry but I am closed to any more patients , thanks

## 2012-01-21 NOTE — Telephone Encounter (Signed)
Kennon Rounds calling from [facility] to inquire about getting this pt in with Dr. Clent Ridges to re-establish care.  Pt was previously seen by Dr. Clent Ridges and switched to [facility] that has now closed.  Pt currently has Medicaid insurance, please advise if ok for pt to re-establish.

## 2012-01-21 NOTE — Telephone Encounter (Signed)
Can you call pt and give her the below message?

## 2012-04-09 ENCOUNTER — Inpatient Hospital Stay (HOSPITAL_COMMUNITY)
Admission: EM | Admit: 2012-04-09 | Discharge: 2012-04-12 | DRG: 379 | Disposition: A | Discharge status: Home / Self Care | Payer: MEDICAID | Attending: Internal Medicine | Admitting: Internal Medicine

## 2012-04-09 ENCOUNTER — Encounter (HOSPITAL_COMMUNITY): Payer: Self-pay | Admitting: Emergency Medicine

## 2012-04-09 DIAGNOSIS — D649 Anemia, unspecified: Secondary | ICD-10-CM | POA: Diagnosis present

## 2012-04-09 DIAGNOSIS — H60399 Other infective otitis externa, unspecified ear: Secondary | ICD-10-CM

## 2012-04-09 DIAGNOSIS — B86 Scabies: Secondary | ICD-10-CM

## 2012-04-09 DIAGNOSIS — T45515A Adverse effect of anticoagulants, initial encounter: Secondary | ICD-10-CM | POA: Diagnosis present

## 2012-04-09 DIAGNOSIS — I10 Essential (primary) hypertension: Secondary | ICD-10-CM | POA: Diagnosis present

## 2012-04-09 DIAGNOSIS — R040 Epistaxis: Secondary | ICD-10-CM

## 2012-04-09 DIAGNOSIS — K921 Melena: Secondary | ICD-10-CM

## 2012-04-09 DIAGNOSIS — Z8673 Personal history of transient ischemic attack (TIA), and cerebral infarction without residual deficits: Secondary | ICD-10-CM

## 2012-04-09 DIAGNOSIS — K625 Hemorrhage of anus and rectum: Secondary | ICD-10-CM | POA: Diagnosis present

## 2012-04-09 DIAGNOSIS — Z85038 Personal history of other malignant neoplasm of large intestine: Secondary | ICD-10-CM

## 2012-04-09 DIAGNOSIS — E785 Hyperlipidemia, unspecified: Secondary | ICD-10-CM | POA: Diagnosis present

## 2012-04-09 DIAGNOSIS — J019 Acute sinusitis, unspecified: Secondary | ICD-10-CM

## 2012-04-09 DIAGNOSIS — I6350 Cerebral infarction due to unspecified occlusion or stenosis of unspecified cerebral artery: Secondary | ICD-10-CM

## 2012-04-09 DIAGNOSIS — R32 Unspecified urinary incontinence: Secondary | ICD-10-CM

## 2012-04-09 DIAGNOSIS — Q211 Atrial septal defect: Secondary | ICD-10-CM

## 2012-04-09 DIAGNOSIS — K633 Ulcer of intestine: Secondary | ICD-10-CM

## 2012-04-09 DIAGNOSIS — T782XXA Anaphylactic shock, unspecified, initial encounter: Secondary | ICD-10-CM

## 2012-04-09 DIAGNOSIS — Z86010 Personal history of colonic polyps: Secondary | ICD-10-CM

## 2012-04-09 DIAGNOSIS — R791 Abnormal coagulation profile: Secondary | ICD-10-CM | POA: Diagnosis present

## 2012-04-09 DIAGNOSIS — L293 Anogenital pruritus, unspecified: Secondary | ICD-10-CM

## 2012-04-09 DIAGNOSIS — R195 Other fecal abnormalities: Secondary | ICD-10-CM | POA: Diagnosis present

## 2012-04-09 DIAGNOSIS — B373 Candidiasis of vulva and vagina: Secondary | ICD-10-CM

## 2012-04-09 DIAGNOSIS — J069 Acute upper respiratory infection, unspecified: Secondary | ICD-10-CM

## 2012-04-09 DIAGNOSIS — IMO0001 Reserved for inherently not codable concepts without codable children: Secondary | ICD-10-CM | POA: Diagnosis present

## 2012-04-09 DIAGNOSIS — D689 Coagulation defect, unspecified: Secondary | ICD-10-CM

## 2012-04-09 DIAGNOSIS — R079 Chest pain, unspecified: Secondary | ICD-10-CM

## 2012-04-09 DIAGNOSIS — D518 Other vitamin B12 deficiency anemias: Secondary | ICD-10-CM

## 2012-04-09 NOTE — ED Notes (Signed)
Pt alert, arrives from home, sent per PCP, elevated PT/INR, denies any other c/o, resp even unlabored, skin pwd

## 2012-04-10 DIAGNOSIS — Z85038 Personal history of other malignant neoplasm of large intestine: Secondary | ICD-10-CM

## 2012-04-10 DIAGNOSIS — D649 Anemia, unspecified: Secondary | ICD-10-CM

## 2012-04-10 DIAGNOSIS — E785 Hyperlipidemia, unspecified: Secondary | ICD-10-CM

## 2012-04-10 DIAGNOSIS — I10 Essential (primary) hypertension: Secondary | ICD-10-CM

## 2012-04-10 DIAGNOSIS — K625 Hemorrhage of anus and rectum: Secondary | ICD-10-CM | POA: Diagnosis present

## 2012-04-10 DIAGNOSIS — R195 Other fecal abnormalities: Secondary | ICD-10-CM | POA: Diagnosis present

## 2012-04-10 DIAGNOSIS — R791 Abnormal coagulation profile: Secondary | ICD-10-CM

## 2012-04-10 LAB — GLUCOSE, CAPILLARY
Glucose-Capillary: 145 mg/dL — ABNORMAL HIGH (ref 70–99)
Glucose-Capillary: 226 mg/dL — ABNORMAL HIGH (ref 70–99)

## 2012-04-10 LAB — HEMOGLOBIN AND HEMATOCRIT, BLOOD
HCT: 33.2 % — ABNORMAL LOW (ref 36.0–46.0)
HCT: 33.8 % — ABNORMAL LOW (ref 36.0–46.0)
Hemoglobin: 10.9 g/dL — ABNORMAL LOW (ref 12.0–15.0)
Hemoglobin: 11.2 g/dL — ABNORMAL LOW (ref 12.0–15.0)

## 2012-04-10 LAB — CBC WITH DIFFERENTIAL/PLATELET
HCT: 35.3 % — ABNORMAL LOW (ref 36.0–46.0)
Hemoglobin: 11.6 g/dL — ABNORMAL LOW (ref 12.0–15.0)
Lymphocytes Relative: 33 % (ref 12–46)
MCHC: 32.9 g/dL (ref 30.0–36.0)
Monocytes Absolute: 0.4 10*3/uL (ref 0.1–1.0)
Monocytes Relative: 8 % (ref 3–12)
Neutro Abs: 3 10*3/uL (ref 1.7–7.7)
WBC: 5.4 10*3/uL (ref 4.0–10.5)

## 2012-04-10 LAB — APTT: aPTT: 53 seconds — ABNORMAL HIGH (ref 24–37)

## 2012-04-10 LAB — OCCULT BLOOD, POC DEVICE: Fecal Occult Bld: POSITIVE

## 2012-04-10 LAB — HEMOGLOBIN A1C: Hgb A1c MFr Bld: 8.1 % — ABNORMAL HIGH (ref <5.7)

## 2012-04-10 MED ORDER — ZOLPIDEM TARTRATE 5 MG PO TABS: 5.0000 mg | ORAL_TABLET | Freq: Every evening | ORAL | Status: DC | PRN

## 2012-04-10 MED ORDER — ONDANSETRON HCL 4 MG PO TABS: 4.0000 mg | ORAL_TABLET | Freq: Four times a day (QID) | ORAL | Status: DC | PRN

## 2012-04-10 MED ORDER — ALUM & MAG HYDROXIDE-SIMETH 200-200-20 MG/5ML PO SUSP: 30.0000 mL | Freq: Four times a day (QID) | ORAL | Status: DC | PRN

## 2012-04-10 MED ORDER — ACETAMINOPHEN 650 MG RE SUPP: 650.0000 mg | Freq: Four times a day (QID) | RECTAL | Status: DC | PRN

## 2012-04-10 MED ORDER — ONDANSETRON HCL 4 MG/2ML IJ SOLN
4.0000 mg | Freq: Four times a day (QID) | INTRAMUSCULAR | Status: DC | PRN
  Filled 2012-04-10: qty 2

## 2012-04-10 MED ORDER — SODIUM CHLORIDE 0.9 % IV SOLN: INTRAVENOUS | Status: AC

## 2012-04-10 MED ORDER — VITAMIN K1 10 MG/ML IJ SOLN
1.0000 mg | Freq: Once | INTRAVENOUS | Status: AC
  Administered 2012-04-10: 1 mg via INTRAVENOUS
  Filled 2012-04-10: qty 0.1

## 2012-04-10 MED ORDER — ACETAMINOPHEN 325 MG PO TABS: 650.0000 mg | ORAL_TABLET | Freq: Four times a day (QID) | ORAL | Status: DC | PRN

## 2012-04-10 MED ORDER — OXYCODONE HCL 5 MG PO TABS: 5.0000 mg | ORAL_TABLET | ORAL | Status: DC | PRN

## 2012-04-10 MED ORDER — INSULIN ASPART 100 UNIT/ML ~~LOC~~ SOLN
0.0000 [IU] | SUBCUTANEOUS | Status: DC
Start: 2012-04-10 — End: 2012-04-11
  Administered 2012-04-10: 3 [IU] via SUBCUTANEOUS
  Administered 2012-04-10: 1 [IU] via SUBCUTANEOUS
  Administered 2012-04-10: 3 [IU] via SUBCUTANEOUS
  Administered 2012-04-10: 2 [IU] via SUBCUTANEOUS
  Administered 2012-04-10: 3 [IU] via SUBCUTANEOUS
  Administered 2012-04-11 (×2): 2 [IU] via SUBCUTANEOUS
  Administered 2012-04-11: 1 [IU] via SUBCUTANEOUS
  Administered 2012-04-11: 5 [IU] via SUBCUTANEOUS

## 2012-04-10 MED ORDER — FAMOTIDINE IN NACL 20-0.9 MG/50ML-% IV SOLN
20.0000 mg | Freq: Two times a day (BID) | INTRAVENOUS | Status: DC
  Administered 2012-04-10 – 2012-04-11 (×3): 20 mg via INTRAVENOUS
  Filled 2012-04-10 (×3): qty 50

## 2012-04-10 MED ORDER — SODIUM CHLORIDE 0.9 % IV SOLN
INTRAVENOUS | Status: DC
  Administered 2012-04-10 (×2): via INTRAVENOUS

## 2012-04-10 MED ORDER — PHYTONADIONE NICU INJECTION 1 MG/0.5 ML: 1.0000 mg | Freq: Once | INTRAMUSCULAR | Status: DC

## 2012-04-10 MED ORDER — INSULIN ASPART 100 UNIT/ML ~~LOC~~ SOLN: 5.0000 [IU] | Freq: Two times a day (BID) | SUBCUTANEOUS | Status: DC

## 2012-04-10 NOTE — H&P (Signed)
[facility] History and Physical  CHANDELLE DINGA QQV:[id number] DOB: 1955/10/10 DOA: 04/09/2012  Referring physician: EDP PCP: [name], MD  Specialists:   Chief Complaint: Elevated Coumadin Level  HPI: [name] is a 56 y.o. female who was advised to go to the ED after getting results of her coumadin level because it was too high.  She has been on Coumadin since a CVA in 2008, and since [facility] closed she has been going back to [facility] to have her medical care and get her coumadin checks.  She was called by the facility later in the day after her PT/INR results returned and was told to go to the ED.  In the ED she was also found to have a Heme + Rectal exam and was referred for medical admission.  She denies having noticed any melena, hematochezia, and she denies having hematemesis.  She does report having lower abdominal pain today.     Review of Systems: The patient denies anorexia, fever, weight loss, vision loss, decreased hearing, hoarseness, chest pain, syncope, dyspnea on exertion, peripheral edema, balance deficits, hemoptysis, abdominal pain, melena, hematochezia, severe indigestion/heartburn, hematuria, incontinence, genital sores, muscle weakness, suspicious skin lesions, transient blindness, difficulty walking, depression, unusual weight change, abnormal bleeding, enlarged lymph nodes, angioedema, and breast masses.    Past Medical History  Diagnosis Date  . Diabetes mellitus   . Stroke   . Colon cancer    Past Surgical History  Procedure Date  . Colon surgery   . Esophagogastroduodenoscopy 11/07/2011    Procedure: ESOPHAGOGASTRODUODENOSCOPY (EGD);  Surgeon: Beverley Fiedler, MD;  Location: [facility];  Service: Gastroenterology;  Laterality: N/A;    Medications:  HOME MEDS: Prior to Admission medications   Medication Sig Start Date End Date Taking? Authorizing Provider  ferrous sulfate 325 (65 FE) MG tablet Take 325 mg by mouth 2  (two) times daily.   Yes Historical Provider, MD  insulin aspart (NOVOLOG) 100 UNIT/ML injection Inject 10-35 Units into the skin 2 (two) times daily. Takes 10 units every night and 34 units every morning    Yes Historical Provider, MD  metFORMIN (GLUCOPHAGE) 1000 MG tablet Take 1,000 mg by mouth 2 (two) times daily with a meal.   Yes Historical Provider, MD  warfarin (COUMADIN) 5 MG tablet Take 5-7.5 mg by mouth daily. Take 1.5 tablets (7.5mg ) on all days except Tuesday & Thursday take 1 tablet (5mg )   Yes Historical Provider, MD     Allergies  Allergen Reactions  . Glipizide     REACTION: nausea and vomiting  . Hydrocodone-Acetaminophen     REACTION: vomiting and rash  . Ibuprofen     REACTION: tongue swells and dyspnea  . Iohexol      Code: HIVES, Desc: pt called day after IV contrast c/o red, swollen itching feet and red face that started 4hrs after ct, Onset Date: [id number]   . Morphine     REACTION: rash  . Rosiglitazone-Metformin     REACTION: rash--tolerates Metformin alone  . Sulfamethoxazole     REACTION: rash  . Pantoprazole Palpitations    Social History:  reports that she has never smoked. She does not have any smokeless tobacco history on file. She reports that she does not drink alcohol or use illicit drugs.   Family History:        Diabetes in Mother        Colon Cancer in Mother    Physical Exam:  GEN:  Pleasant 56 year  old obese Native American Female examined  and in no acute distress; cooperative with exam Filed Vitals:   04/09/12 2326 04/10/12 0441  BP: 120/67 122/60  Pulse: 73 61  Temp: 98 F (36.7 C) 98.7 F (37.1 C)  TempSrc:  Oral  Resp: 16 16  Height:  5\' 4"  (1.626 m)  Weight:  74.889 kg (165 lb 1.6 oz)  SpO2: 99% 99%   Blood pressure 122/60, pulse 61, temperature 98.7 F (37.1 C), temperature source Oral, resp. rate 16, height 5\' 4"  (1.626 m), weight 74.889 kg (165 lb 1.6 oz), SpO2 99.00%. PSYCH: SHe is alert and oriented x4; does not  appear anxious does not appear depressed; affect is normal HEENT: Normocephalic and Atraumatic, Mucous membranes pink; PERRLA; EOM intact; Fundi:  Benign;  No scleral icterus, Nares: Patent, Oropharynx: Clear, Fair Dentition, Neck:  FROM, no cervical lymphadenopathy nor thyromegaly or carotid bruit; no JVD; Breasts:: Not examined CHEST WALL: No tenderness CHEST: Normal respiration, clear to auscultation bilaterally HEART: Regular rate and rhythm; no murmurs rubs or gallops BACK: No kyphosis or scoliosis; no CVA tenderness ABDOMEN: Positive Bowel Sounds, Obese, soft non-tender; no masses, no organomegaly.   Rectal Exam: Not done EXTREMITIES: No bone or joint deformity; age-appropriate arthropathy of the hands and knees; no cyanosis, clubbing or edema; no ulcerations. Genitalia: not examined PULSES: 2+ and symmetric SKIN: Normal hydration no rash or ulceration CNS: Cranial nerves 2-12 grossly intact no focal neurologic deficit    Labs on Admission:  Basic Metabolic Panel: No results found for this basename: NA:5,K:5,CL:5,CO2:5,GLUCOSE:5,BUN:5,CREATININE:5,CALCIUM:5,MG:5,PHOS:5 in the last 168 hours Liver Function Tests: No results found for this basename: AST:5,ALT:5,ALKPHOS:5,BILITOT:5,PROT:5,ALBUMIN:5 in the last 168 hours No results found for this basename: LIPASE:5,AMYLASE:5 in the last 168 hours No results found for this basename: AMMONIA:5 in the last 168 hours CBC:  Lab 04/10/12 0020  WBC 5.4  NEUTROABS 3.0  HGB 11.6*  HCT 35.3*  MCV 76.4*  PLT 221   Cardiac Enzymes: No results found for this basename: CKTOTAL:5,CKMB:5,CKMBINDEX:5,TROPONINI:5 in the last 168 hours  BNP (last 3 results) No results found for this basename: PROBNP:3 in the last 8760 hours CBG: No results found for this basename: GLUCAP:5 in the last 168 hours  Radiological Exams on Admission: No results found.  EKG: Independently reviewed.   Assessment/Plan Principal Problem:  *Rectal  bleeding Active Problems:  Supratherapeutic INR  Heme positive stool  DIABETES MELLITUS, TYPE II, UNCONTROLLED  HYPERLIPIDEMIA  ANEMIA  HYPERTENSION  H/O colon cancer   Plan:     Admit to Telemetry Bed Discontinue Coumadin for now, and Reverse Coagulopathy IV Protonix Clear liquids Needs GI Consultation with [facility] (Previously had Colonoscopy with [facility]:   Dr. Lina Sar) Monitor H/Hs Transfuse if Needed Reconcile Home Medications SSI coverage SCDs for DVT prophylaxis    Code Status:  FULL CODE Family Communication:  Son at Bedside Disposition Plan:  Return to Home  Time spent: 94 Minutes   Ron Parker [facility] Pager [phone]  If 7PM-7AM, please contact night-coverage [email or web address] Password [email or web address] 04/10/2012, 5:25 AM

## 2012-04-10 NOTE — ED Notes (Signed)
Pt transferred to 1511 via stretcher with chart and personal belongings accompanied by this nurse, condition stable at time of transfer.

## 2012-04-10 NOTE — ED Provider Notes (Signed)
History     CSN: [id number]  Arrival date & time 04/09/12  2311   First MD Initiated Contact with Patient 04/10/12 0142      Chief Complaint  Patient presents with  . Abnormal Lab    (Consider location/radiation/quality/duration/timing/severity/associated sxs/prior treatment) HPI Comments: Patient is a 56 year old female with a history of a stroke (2003) and diabetes presents emergency department because of an INR of 4.4.  Patient states she was previously followed by [facility], however she was dropped from [facility] and now is unable to afford it.  She is currently getting her PT INR readings every 5-6 weeks at the [facility] hospital because she is able to go for free.  Patient states she is asymptomatic and denies any nausea, abdominal pain, coffee-ground emesis, hematemesis, hematochezia, change in urine, abdominal pain, epistaxis, bleeding from gums, fever, night sweats or chills. Pt currently taking ferrous sulfate for iron def anemia.    The history is provided by the patient.    Past Medical History  Diagnosis Date  . Diabetes mellitus   . Stroke   . Colon cancer     Past Surgical History  Procedure Date  . Colon surgery   . Esophagogastroduodenoscopy 11/07/2011    Procedure: ESOPHAGOGASTRODUODENOSCOPY (EGD);  Surgeon: Beverley Fiedler, MD;  Location: [facility];  Service: Gastroenterology;  Laterality: N/A;    No family history on file.  History  Substance Use Topics  . Smoking status: Never Smoker   . Smokeless tobacco: Not on file  . Alcohol Use: No    OB History    Grav Para Term Preterm Abortions TAB SAB Ect Mult Living                  Review of Systems  Constitutional: Negative for fever, chills and appetite change.  HENT: Negative for congestion.   Eyes: Negative for visual disturbance.  Respiratory: Negative for shortness of breath.   Cardiovascular: Negative for chest pain and leg swelling.  Gastrointestinal: Negative for  nausea, vomiting, abdominal pain, diarrhea, constipation, blood in stool, abdominal distention, anal bleeding and rectal pain.  Genitourinary: Negative for dysuria, urgency and frequency.  Neurological: Negative for dizziness, syncope, weakness, light-headedness, numbness and headaches.  Psychiatric/Behavioral: Negative for confusion.  All other systems reviewed and are negative.    Allergies  Glipizide; Hydrocodone-acetaminophen; Ibuprofen; Iohexol; Morphine; Rosiglitazone-metformin; Sulfamethoxazole; and Pantoprazole  Home Medications   Current Outpatient Rx  Name  Route  Sig  Dispense  Refill  . FERROUS SULFATE 325 (65 FE) MG PO TABS   Oral   Take 325 mg by mouth 2 (two) times daily.         . INSULIN ASPART 100 UNIT/ML [place] SOLN   Subcutaneous   Inject 10-35 Units into the skin 2 (two) times daily. Takes 10 units every night and 34 units every morning          . METFORMIN HCL 1000 MG PO TABS   Oral   Take 1,000 mg by mouth 2 (two) times daily with a meal.         . WARFARIN SODIUM 5 MG PO TABS   Oral   Take 5-7.5 mg by mouth daily. Take 1.5 tablets (7.5mg ) on all days except Tuesday & Thursday take 1 tablet (5mg )           BP 120/67  Pulse 73  Temp 98 F (36.7 C)  Resp 16  SpO2 99%  Physical Exam  Nursing note and  vitals reviewed. Constitutional: She is oriented to person, place, and time. She appears well-developed and well-nourished. No distress.  HENT:  Head: Normocephalic and atraumatic.  Eyes: Conjunctivae normal and EOM are normal.  Neck: Normal range of motion.  Cardiovascular:       RRR, no aberrancy on auscultation, intact distal pulses  Pulmonary/Chest: Effort normal.  Abdominal:       Soft non tender w nl bowel sounds  Genitourinary: Guaiac positive stool.  Musculoskeletal: Normal range of motion.  Neurological: She is alert and oriented to person, place, and time.  Skin: Skin is warm and dry. No rash noted. She is not diaphoretic.    Psychiatric: She has a normal mood and affect. Her behavior is normal.    ED Course  Procedures (including critical care time)  Labs Reviewed  PROTIME-INR - Abnormal; Notable for the following:    Prothrombin Time 35.7 (*)     INR 3.87 (*)     All other components within normal limits  APTT - Abnormal; Notable for the following:    aPTT 53 (*)     All other components within normal limits  CBC WITH DIFFERENTIAL - Abnormal; Notable for the following:    Hemoglobin 11.6 (*)     HCT 35.3 (*)     MCV 76.4 (*)     MCH 25.1 (*)     All other components within normal limits  OCCULT BLOOD X 1 CARD TO LAB, STOOL   No results found.   No diagnosis found.    MDM  Supratherapeutic on Coumadin, Heme +  Patient is a 56 year old female currently on Coumadin for a stroke in 2003.  Patient was sent to the emergency department from [facility] reserve for an INR of 4.4.  She is asymptomatic, however on exam Hemoccult was positive.  She did not have followup and is unable to have serial PT INR checks.  Patient is to be admitted under observation to monitor her Coumadin levels and for consideration of switching anticoagulation considering inability to maintain followup.  Patient is aware of plan and is agreeable. The patient appears reasonably stabilized for admission considering the current resources, flow, and capabilities available in the ED at this time, and I doubt any other [facility] requiring further screening and/or treatment in the ED prior to admission.         Jaci Carrel, [place] 04/10/12 [phone]

## 2012-04-10 NOTE — ED Notes (Addendum)
Patient states she went in for routine pt/inr check. Patient was notified by her physician that her level was 4.4. Patient denies any recent changes in her coumadin dosage. Patient was advised by primary physician to come to ER. Patient denies pain. Patient denies any coughing up blood, denies bloody or tarry stools.

## 2012-04-10 NOTE — Progress Notes (Signed)
Patient admitted this morning with elevated INR and guaiac positive stools. Patient was evaluated by GI. No further workup needed at this time. Will advance diet to full liquids and then advance as tolerated. Hemoglobin is stable.

## 2012-04-10 NOTE — ED Provider Notes (Signed)
Medical screening examination/treatment/procedure(s) were performed by non-physician practitioner and as supervising physician I was immediately available for consultation/collaboration.  Jasmine Awe, MD 04/10/12 [phone]

## 2012-04-10 NOTE — Consult Note (Signed)
[facility] Consultation  Referring Provider: [facility] Primary Care Physician:  [name], MD Primary Gastroenterologist:   Elon Alas Reason for Consultation:  heme positive stool  HPI: [name] is a 56 y.o. female who as advised to go to ED because of a supratherapeutic INR. In ED she was found to be heme+ and was admitted for evaluation. Patient is known to Dr. Juanda Chance. She has a personal history of colon cancer, s/p sigmoid colectomy 2002. She had an inpatient EGD in June of this year for ? melena on iron and nausea and vomiting. Findings included only gastritis. Her last colonoscopy was In January 2012 with finding of a small polyp.    Patient denies any overt blood loss. Her last BM was yesterday and it was normal. She had some mid abdominal discomfort on admission but that has resolved. No nausea. Patient's hemoglobin is at baseline. She complains of feeling weak.   Past Medical History  Diagnosis Date  . Diabetes mellitus   . Stroke   . Colon cancer     Past Surgical History  Procedure Date  . Colon surgery   . Esophagogastroduodenoscopy 11/07/2011    Procedure: ESOPHAGOGASTRODUODENOSCOPY (EGD);  Surgeon: Beverley Fiedler, MD;  Location: [facility];  Service: Gastroenterology;  Laterality: N/A;    No family history on file.  History  Substance Use Topics  . Smoking status: Never Smoker   . Smokeless tobacco: Not on file  . Alcohol Use: No    Prior to Admission medications   Medication Sig Start Date End Date Taking? Authorizing Provider  ferrous sulfate 325 (65 FE) MG tablet Take 325 mg by mouth 2 (two) times daily.   Yes Historical Provider, MD  insulin aspart (NOVOLOG) 100 UNIT/ML injection Inject 10-35 Units into the skin 2 (two) times daily. Takes 10 units every night and 34 units every morning    Yes Historical Provider, MD  metFORMIN (GLUCOPHAGE) 1000 MG tablet Take 1,000 mg by mouth 2 (two) times daily with a meal.    Yes Historical Provider, MD  warfarin (COUMADIN) 5 MG tablet Take 5-7.5 mg by mouth daily. Take 1.5 tablets (7.5mg ) on all days except Tuesday & Thursday take 1 tablet (5mg )   Yes Historical Provider, MD    Current Facility-Administered Medications  Medication Dose Route Frequency Provider Last Rate Last Dose  . 0.9 %  sodium chloride infusion   Intravenous STAT Lisette Paz, PA-C      . 0.9 %  sodium chloride infusion   Intravenous Continuous Alinda Money, MD 75 mL/hr at 04/10/12 1124    . acetaminophen (TYLENOL) tablet 650 mg  650 mg Oral Q6H PRN Ron Parker, MD       Or  . acetaminophen (TYLENOL) suppository 650 mg  650 mg Rectal Q6H PRN Ron Parker, MD      . alum & mag hydroxide-simeth (MAALOX/MYLANTA) 200-200-20 MG/5ML suspension 30 mL  30 mL Oral Q6H PRN Ron Parker, MD      . famotidine (PEPCID) IVPB 20 mg  20 mg Intravenous Q12H Ron Parker, MD   20 mg at 04/10/12 1125  . insulin aspart (novoLOG) injection 0-9 Units  0-9 Units Subcutaneous Q4H Ron Parker, MD   2 Units at 04/10/12 0840  . ondansetron (ZOFRAN) tablet 4 mg  4 mg Oral Q6H PRN Ron Parker, MD       Or  . ondansetron (ZOFRAN) injection 4 mg  4 mg Intravenous Q6H PRN Harvette  Velora Heckler, MD      . oxyCODONE (Oxy IR/ROXICODONE) immediate release tablet 5 mg  5 mg Oral Q4H PRN Ron Parker, MD      . [address] phytonadione (VITAMIN K) 1 mg in dextrose 5 % 50 mL IVPB  1 mg Intravenous Once Ron Parker, MD   1 mg at 04/10/12 4540  . zolpidem (AMBIEN) tablet 5 mg  5 mg Oral QHS PRN Ron Parker, MD      . [DISCONTINUED] insulin aspart (novoLOG) injection 5-15 Units  5-15 Units Subcutaneous BID Ron Parker, MD      . [DISCONTINUED] phytonadione (VITAMIN K) NICU Injection 1 mg/0.5 mL  1 mg Intramuscular Once Ron Parker, MD        Allergies as of 04/09/2012 - Review Complete 04/09/2012  Allergen Reaction Noted  . Glipizide  11/25/2006  .  Hydrocodone-acetaminophen  11/25/2006  . Ibuprofen  11/25/2006  . Iohexol  06/20/2010  . Morphine  11/25/2006  . Rosiglitazone-metformin  11/25/2006  . Sulfamethoxazole  11/25/2006     Review of Systems: Complains of weakness and excessive gas. All other systems reviewed and negative except where noted in HPI  PHYSICAL EXAM: Vital signs in last 24 hours: Temp:  [97.3 F (36.3 C)-98.7 F (37.1 C)] 97.3 F (36.3 C) (11/09 1017) Pulse Rate:  [61-78] 78  (11/09 1017) Resp:  [16-18] 18  (11/09 1017) BP: (107-122)/(60-70) 107/70 mmHg (11/09 1017) SpO2:  [96 %-99 %] 96 % (11/09 1017) Weight:  [165 lb 1.6 oz (74.889 kg)] 165 lb 1.6 oz (74.889 kg) (11/09 0441) Last BM Date: 04/09/12 General:   Pleasant female in NAD Head:  Normocephalic and atraumatic. Eyes:   No icterus.   Conjunctiva pink. Ears:  Normal auditory acuity. Neck:  Supple; no masses felt Lungs:  Respirations even and unlabored. Lungs clear to auscultation bilaterally.   No wheezes, crackles, or rhonchi.  Heart:  Regular rate and rhythm;  murmur heard. Abdomen:  Soft, nondistended, nontender. Normal bowel sounds. No appreciable masses or hepatomegaly.  Rectal:  Scant dark brown stool in vault.  Msk:  Symmetrical without gross deformities.  Extremities:  Without edema. Neurologic:  Alert and  oriented x4;  grossly normal neurologically. Skin:  Intact without significant lesions or rashes. Cervical Nodes:  No significant cervical adenopathy. Psych:  Alert and cooperative. Normal affect.  LAB RESULTS:  Basename 04/10/12 0633 04/10/12 0020  WBC -- 5.4  HGB 10.9* 11.6*  HCT 33.2* 35.3*  PLT -- 221   PT/INR  Basename 04/10/12 0020  LABPROT 35.7*  INR 3.87*    PREVIOUS ENDOSCOPIES: See HPI  IMPRESSION / PLAN:  26.  56 year old female with supratherapeutic INR and heme positive stools. Her hemoglobin is a baseline, no overt GI bleeding and abdominal exam is benign. No need for endoscopic or other GI workup at  present.   2. Chronic anticoagulation. On coumadin for CVA.  3. Personal history of stage 3 CRC, s/p colon sigmoid colectomy / chemo in 2002.  Last colonoscopy was Jan 2012 with findings of a small non-adenomatous polyp and superficial ulcerations at site of anastomosis. No recurrent cancer. Of note, bowel prep was poor.    Thanks   LOS: 1 day   Willette Cluster  04/10/2012, 11:52 AM    Chart was reviewed and patient was examined. X-rays were reviewed.   She is asymptomatic with heme positive stool.  This may be due to spontaneous bleeding in the face of hypercoagulabity.  She had frank rectal bleeding 6 months under similar circumstances.  Doubt any significant GI lesions in view of recent EGD/colonoscopy.   Recommend 1.  F/u hemeoccults as outpt after INR is back to theraputic.  If still positive would consider capsule study.  Barbette Hair. Arlyce Dice, M.D., [facility] Cell [phone]

## 2012-04-11 LAB — GLUCOSE, CAPILLARY
Glucose-Capillary: 161 mg/dL — ABNORMAL HIGH (ref 70–99)
Glucose-Capillary: 210 mg/dL — ABNORMAL HIGH (ref 70–99)
Glucose-Capillary: 196 mg/dL — ABNORMAL HIGH (ref 70–99)
Glucose-Capillary: 174 mg/dL — ABNORMAL HIGH (ref 70–99)

## 2012-04-11 LAB — BASIC METABOLIC PANEL
BUN: 7 mg/dL (ref 6–23)
CO2: 24 mEq/L (ref 19–32)
Calcium: 8.4 mg/dL (ref 8.4–10.5)
GFR calc non Af Amer: >90 mL/min (ref >90)
Glucose, Bld: 145 mg/dL — ABNORMAL HIGH (ref 70–99)
Sodium: 142 mEq/L (ref 135–145)

## 2012-04-11 LAB — CBC
MCH: 25.1 pg — ABNORMAL LOW (ref 26.0–34.0)
MCHC: 32.5 g/dL (ref 30.0–36.0)
MCV: 77 fL — ABNORMAL LOW (ref 78.0–100.0)
Platelets: 193 10*3/uL (ref 150–400)
RBC: 4.31 MIL/uL (ref 3.87–5.11)

## 2012-04-11 MED ORDER — METFORMIN HCL 500 MG PO TABS
1000.0000 mg | ORAL_TABLET | Freq: Two times a day (BID) | ORAL | Status: DC
  Administered 2012-04-11 – 2012-04-12 (×2): 1000 mg via ORAL
  Filled 2012-04-11 (×4): qty 2

## 2012-04-11 MED ORDER — INSULIN ASPART 100 UNIT/ML ~~LOC~~ SOLN
0.0000 [IU] | Freq: Every day | SUBCUTANEOUS | Status: DC
  Administered 2012-04-11: 0 [IU] via SUBCUTANEOUS

## 2012-04-11 MED ORDER — WARFARIN - PHARMACIST DOSING INPATIENT: Freq: Every day | Status: DC

## 2012-04-11 MED ORDER — INSULIN ASPART 100 UNIT/ML ~~LOC~~ SOLN
0.0000 [IU] | Freq: Three times a day (TID) | SUBCUTANEOUS | Status: DC
  Administered 2012-04-11: 5 [IU] via SUBCUTANEOUS
  Administered 2012-04-12: 3 [IU] via SUBCUTANEOUS

## 2012-04-11 MED ORDER — WARFARIN SODIUM 7.5 MG PO TABS
7.5000 mg | ORAL_TABLET | Freq: Once | ORAL | Status: AC
  Administered 2012-04-11: 7.5 mg via ORAL
  Filled 2012-04-11: qty 1

## 2012-04-11 MED ORDER — FERROUS SULFATE 325 (65 FE) MG PO TABS
325.0000 mg | ORAL_TABLET | Freq: Two times a day (BID) | ORAL | Status: DC
  Administered 2012-04-11 – 2012-04-12 (×3): 325 mg via ORAL
  Filled 2012-04-11 (×4): qty 1

## 2012-04-11 MED ORDER — INSULIN GLARGINE 100 UNIT/ML ~~LOC~~ SOLN
5.0000 [IU] | Freq: Every day | SUBCUTANEOUS | Status: DC
  Administered 2012-04-11: via SUBCUTANEOUS

## 2012-04-11 NOTE — Progress Notes (Signed)
Marland KitchenMarland KitchenMarland Kitchen........... [facility] PROGRESS NOTE  TALEYA WHITCHER ZOX:[id number] DOB: 09-Oct-1955 DOA: 04/09/2012 PCP: [name], MD  Assessment/Plan: Principal Problem:  Rectal bleeding Rectal bleeding has resolved. Hemoglobin is stable. Patient was evaluated by GI. No intervention needed at this time. Bleeding was most likely secondary to elevated INR. Patient's INR today is 1.68. Resumed her Coumadin. Plan for discharge tomorrow to follow up with primary care physician.    DIABETES MELLITUS, TYPE II, UNCONTROLLED Blood sugar is elevated. Resume home medication in addition to sliding scale insulin.  History of CVA Resume Coumadin   HYPERLIPIDEMIA Followup outpatient   ANEMIA Hemoglobin is stable   HYPERTENSION  Blood pressure looks good   Code Status: Full Code Family Communication: Discussed with patient Disposition Plan: Plan for discharge tomorrow  Molli Posey, MD  [facility] Pager [phone]  If 7PM-7AM, please contact night-coverage [email or web address] Password [email or web address] 04/11/2012, 1:41 PM   LOS: 2 days   Brief narrative: None  Consultants:  GI:Dr Arlyce Dice  Procedures:  None  Antibiotics:  None ?  HPI/Subjective: Patient is sitting up in chair. She feels fine. No complaints.  Objective: Filed Vitals:   04/11/12 0214 04/11/12 0702 04/11/12 1052 04/11/12 1339  BP: 99/57 113/57 107/68 101/63  Pulse: 70  73 60  Temp: 98.2 F (36.8 C) 98.1 F (36.7 C) 97.6 F (36.4 C) 97.4 F (36.3 C)  TempSrc: Oral Oral Oral Oral  Resp: 18 18 18 18   Height:      Weight:      SpO2: 97% 95% 96% 97%    Intake/Output Summary (Last 24 hours) at 04/11/12 1341 Last data filed at 04/11/12 0658  Gross per 24 hour  Intake    650 ml  Output      0 ml  Net    650 ml    Exam:   General: Patient sitting up in chair in no acute distress.  Cardiovascular: Regular rate rhythm  Respiratory: Clear to auscultations bilaterally  ABD soft nontender  nondistended positive bowel sounds  EXT no edema  Data Reviewed: Basic Metabolic Panel:  Lab 04/11/12 8295  NA 142  K 3.5  CL 110  CO2 24  GLUCOSE 145*  BUN 7  CREATININE 0.58  CALCIUM 8.4  MG --  PHOS --   Ls No results found for this basename: AMMONIA:5 in the last 168 hours CBC:  Lab 04/11/12 0520 04/10/12 1757 04/10/12 0633 04/10/12 0020  WBC 4.5 -- -- 5.4  NEUTROABS -- -- -- 3.0  HGB 10.8* 11.2* 10.9* 11.6*  HCT 33.2* 33.8* 33.2* 35.3*  MCV 77.0* -- -- 76.4*  PLT 193 -- -- 221     Lab 04/11/12 0401 04/11/12 0017 04/10/12 1959 04/10/12 1619 04/10/12 1124  GLUCAP 161* 132* 226* 145* 224*     Studies: No results found.  Scheduled Meds:   . [EXPIRED] sodium chloride   Intravenous STAT  . ferrous sulfate  325 mg Oral BID  . insulin aspart  0-15 Units Subcutaneous TID WC  . insulin aspart  0-5 Units Subcutaneous QHS  . insulin glargine  5 Units Subcutaneous QHS  . metFORMIN  1,000 mg Oral BID WC  . warfarin  7.5 mg Oral ONCE-1800  . Warfarin - Pharmacist Dosing Inpatient   Does not apply q1800  . [DISCONTINUED] famotidine (PEPCID) IV  20 mg Intravenous Q12H  . [DISCONTINUED] insulin aspart  0-9 Units Subcutaneous Q4H   Continuous Infusions:   . [DISCONTINUED] sodium chloride 75 mL/hr at 04/10/12 1842  Time Spent: 25 min

## 2012-04-11 NOTE — Plan of Care (Signed)
Problem: Phase III Progression Outcomes Goal: Pain controlled on oral analgesia Outcome: Not Applicable Date Met:  04/11/12 No pain  Goal: Foley discontinued Outcome: Not Applicable Date Met:  04/11/12 No foley

## 2012-04-11 NOTE — Progress Notes (Signed)
ANTICOAGULATION CONSULT NOTE - Initial Consult  Pharmacy Consult for Coumadin Indication: stroke  Allergies  Allergen Reactions  . Glipizide     REACTION: nausea and vomiting  . Hydrocodone-Acetaminophen     REACTION: vomiting and rash  . Ibuprofen     REACTION: tongue swells and dyspnea  . Iohexol      Code: HIVES, Desc: pt called day after IV contrast c/o red, swollen itching feet and red face that started 4hrs after ct, Onset Date: [id number]   . Morphine     REACTION: rash  . Rosiglitazone-Metformin     REACTION: rash--tolerates Metformin alone  . Sulfamethoxazole     REACTION: rash  . Pantoprazole Palpitations   Labs:  Basename 04/11/12 0520 04/10/12 1757 04/10/12 0633 04/10/12 0020  HGB 10.8* 11.2* -- --  HCT 33.2* 33.8* 33.2* --  PLT 193 -- -- 221  APTT -- -- -- 53*  LABPROT 19.2* -- -- 35.7*  INR 1.68* -- -- 3.87*  HEPARINUNFRC -- -- -- --  CREATININE 0.58 -- -- --  CKTOTAL -- -- -- --  CKMB -- -- -- --  TROPONINI -- -- -- --    Assessment:  56 yof on Coumadin PTA for h/o CVA per notes (but per MD consult, Coumadin is for atrial fibrillation?).  PTA dose reportedly 7.5mg /day EXCEPT 5mg  on Tue/Thur with last dose being 11/8.  Patient presented with INR of 3.87 on 11/9, guaiac positive stools, Hgb wnl, no overt GI bleed.  GI consulted and rec'd no further GI workup.  Pt received Vitamin K 1mg  IV x 1 on 11/9.  INR this morning dropped to 1.68 and MD would like to resume Coumadin per pharmacy protocol.    Hgb 10.8, no active bleeding   Goal of Therapy:  INR 2-3 Monitor platelets by anticoagulation protocol: Yes   Plan:   Coumadin 7.5mg  today given recent Vitamin K and subtherapeutic INR  Daily PT/INR  Pharmacy will f/u  Geoffry Paradise, PharmD, BCPS Pager: [phone] 8:33 AM Pharmacy #: [phone]

## 2012-04-11 NOTE — Progress Notes (Signed)
[name] is on a full liquid diet which she has tolerated well. However she verbalizes being very hungry and would like to have her diet advanced this morning. Also her blood glucose is being checked every four hours, may we change to ACHS?

## 2012-04-12 DIAGNOSIS — D689 Coagulation defect, unspecified: Secondary | ICD-10-CM

## 2012-04-12 LAB — CBC
HCT: 33.9 % — ABNORMAL LOW (ref 36.0–46.0)
Hemoglobin: 11.2 g/dL — ABNORMAL LOW (ref 12.0–15.0)
MCHC: 33 g/dL (ref 30.0–36.0)

## 2012-04-12 LAB — GLUCOSE, CAPILLARY: Glucose-Capillary: 176 mg/dL — ABNORMAL HIGH (ref 70–99)

## 2012-04-12 LAB — BASIC METABOLIC PANEL
BUN: 11 mg/dL (ref 6–23)
Chloride: 107 mEq/L (ref 96–112)
GFR calc non Af Amer: >90 mL/min (ref >90)
Glucose, Bld: 188 mg/dL — ABNORMAL HIGH (ref 70–99)
Potassium: 3.7 mEq/L (ref 3.5–5.1)

## 2012-04-12 LAB — PROTIME-INR: INR: 1.41 (ref 0.00–1.49)

## 2012-04-12 MED ORDER — WARFARIN SODIUM 5 MG PO TABS: 5.0000 mg | ORAL_TABLET | Freq: Every day | ORAL | Status: DC

## 2012-04-12 MED ORDER — ENOXAPARIN (LOVENOX) PATIENT EDUCATION KIT
PACK | Freq: Once | Status: AC
  Administered 2012-04-12: 14:00:00
  Filled 2012-04-12: qty 1

## 2012-04-12 MED ORDER — WARFARIN SODIUM 7.5 MG PO TABS
7.5000 mg | ORAL_TABLET | Freq: Once | ORAL | Status: DC
  Filled 2012-04-12: qty 1

## 2012-04-12 MED ORDER — ENOXAPARIN SODIUM 150 MG/ML ~~LOC~~ SOLN: 75.0000 mg | Freq: Two times a day (BID) | SUBCUTANEOUS | Status: DC

## 2012-04-12 MED ORDER — ENOXAPARIN SODIUM 80 MG/0.8ML ~~LOC~~ SOLN
75.0000 mg | Freq: Two times a day (BID) | SUBCUTANEOUS | Status: DC
  Administered 2012-04-12: 75 mg via SUBCUTANEOUS
  Filled 2012-04-12 (×2): qty 0.8

## 2012-04-12 NOTE — Progress Notes (Signed)
Pt assisted with Lovenox through the indigent fund program. Appt set up for her with [employer] at [facility].

## 2012-04-12 NOTE — Progress Notes (Signed)
ANTICOAGULATION CONSULT NOTE   Pharmacy Consult for Coumadin / Lovenox Indication: History of CVA  Allergies  Allergen Reactions  . Glipizide     REACTION: nausea and vomiting  . Hydrocodone-Acetaminophen     REACTION: vomiting and rash  . Ibuprofen     REACTION: tongue swells and dyspnea  . Iohexol      Code: HIVES, Desc: pt called day after IV contrast c/o red, swollen itching feet and red face that started 4hrs after ct, Onset Date: [id number]   . Morphine     REACTION: rash  . Rosiglitazone-Metformin     REACTION: rash--tolerates Metformin alone  . Sulfamethoxazole     REACTION: rash  . Pantoprazole Palpitations   Labs:  [facility] 04/12/12 0511 04/11/12 0520 04/10/12 1757 04/10/12 0020  HGB 11.2* 10.8* -- --  HCT 33.9* 33.2* 33.8* --  PLT 194 193 -- 221  APTT -- -- -- 53*  LABPROT 16.9* 19.2* -- 35.7*  INR 1.41 1.68* -- 3.87*  HEPARINUNFRC -- -- -- --  CREATININE 0.58 0.58 -- --  CKTOTAL -- -- -- --  CKMB -- -- -- --  TROPONINI -- -- -- --    Assessment:  56 yof on Coumadin PTA for h/o CVA.  PTA dose reportedly 7.5mg /day except 5mg  on Tue/Thur.  Presented with INR of 3.87 on 11/9, guaiac positive stools, Hgb wnl, no overt GI bleed.  GI consulted and rec'd no further GI workup.  Pt received Vitamin K 1mg  IV x 1 on 11/9.  Warfarin was resumed 11/10 with 7.5mg  x 1 dose.    INR falling after recent Vitamin K.  Orders received today to add Lovenox.  Goal of Therapy:  INR 2-3 Monitor platelets by anticoagulation protocol: Yes   Plan:   Repeat warfarin booster dose of 7.5mg  x 1 today.  Begin Lovenox 75mg  (1mg /kg) SQ q12h.  Follow INR daily.  Await word on discharge plans.  Elie Goody, PharmD, BCPS Pager: [phone] 04/12/2012  10:45 AM

## 2012-04-12 NOTE — Discharge Summary (Signed)
Physician Discharge Summary  [name] WUJ:[id number] DOB: 1956-05-20 DOA: 04/09/2012  PCP: [name], MD  Admit date: 04/09/2012 Discharge date: 04/12/2012  Recommendations for Outpatient Follow-up:   Follow-up Information    Follow up with [facility], MD. In 2 days. (For INR check)    Contact information:   [address] [place] [address] [phone]         Discharge Diagnoses:  Principal Problem:  Rectal bleeding/ Supratherapeutic INR/ Heme positive stool  DIABETES MELLITUS, TYPE II, UNCONTROLLED  HYPERLIPIDEMIA  ANEMIA  HYPERTENSION  H/O colon cancer   Discharge Condition:Stable Disposition: Home  Diet recommendation: Diabetic  Filed Weights   04/10/12 0441  Weight: 74.889 kg (165 lb 1.6 oz)    History of present illness:  [name] is a 56 y.o. female who was advised to go to the ED after getting results of her coumadin level because it was too high. She has been on Coumadin since a CVA in 2008, and since [facility] closed she has been going back to [facility] to have her medical care and get her coumadin checks. She was called by the facility later in the day after her PT/INR results returned and was told to go to the ED. In the ED she was also found to have a Heme + Rectal exam and was referred for medical admission. She denies having noticed any melena, hematochezia, and she denies having hematemesis. She does report having lower abdominal pain today.    Hospital Course:  Principal Problem:  Rectal bleeding  Patient was admitted with rectal bleeding. INR was noted to be supratherapeutic. Patient was given vitamin K to reverse coumadin. GI was consulted. Per GI most likely reason for bleeding is because of the elevated INR. Recommend conservative treatment. Patient's INR trended down to 1.48.   No further bleeding noted. She does have a history of stroke and is on Coumadin for stroke prevention. Will discharge  patient on Lovenox and Coumadin. Patient will followup with primary care Dr. At [place].  Patient was formerly going to [facility] prior to [facility] closing. She will do Lovenox shots 75 mg twice daily today and tomorrow. This was discussed with patient.   History of CVA  Resume Coumadin. Lovenox to bridge secondary to subtherapeutic INR.  HYPERLIPIDEMIA  Followup outpatient   ANEMIA  Hemoglobin is stable at 11.2 at the time of discharge.  HYPERTENSION  Blood pressure looks good at the time discharge.  Diabetes Patient to continue with metformin and sliding scale insulin.    Discharge Instructions  Discharge Orders    Future Orders Please Complete By Expires   Diet general      Increase activity slowly          Medication List     As of 04/12/2012  3:30 PM    TAKE these medications         enoxaparin 150 MG/ML injection   Commonly known as: LOVENOX   Inject 0.5 mLs (75 mg total) into the skin every 12 (twelve) hours.      ferrous sulfate 325 (65 FE) MG tablet   Take 325 mg by mouth 2 (two) times daily.      insulin aspart 100 UNIT/ML injection   Commonly known as: novoLOG   Inject 10-35 Units into the skin 2 (two) times daily. Takes 10 units every night and 34 units every morning        metFORMIN 1000 MG tablet   Commonly known as: GLUCOPHAGE  Take 1,000 mg by mouth 2 (two) times daily with a meal.      warfarin 5 MG tablet   Commonly known as: COUMADIN   Take 1-1.5 tablets (5-7.5 mg total) by mouth daily. Take 1.5 tablets (7.5mg ) on all days except Tuesday & Thursday take 1 tablet (5mg )           Follow-up Information    Follow up with [facility], MD. In 2 days. (For INR check)    Contact information:   [address] [place] [address] [phone]           The results of significant diagnostics from this hospitalization (including imaging, microbiology, ancillary and laboratory) are listed below for reference.     Labs: Basic Metabolic Panel:  Lab 04/12/12 2536 04/11/12 0520  NA 139 142  K 3.7 3.5  CL 107 110  CO2 24 24  GLUCOSE 188* 145*  BUN 11 7  CREATININE 0.58 0.58  CALCIUM 8.9 8.4  MG -- --  PHOS -- --    Lab 04/12/12 0511 04/11/12 0520 04/10/12 1757 04/10/12 0633 04/10/12 0020  WBC 5.6 4.5 -- -- 5.4  NEUTROABS -- -- -- -- 3.0  HGB 11.2* 10.8* 11.2* 10.9* 11.6*  HCT 33.9* 33.2* 33.8* 33.2* 35.3*  MCV 76.0* 77.0* -- -- 76.4*  PLT 194 193 -- -- 221    CBG:  Lab 04/12/12 0812 04/11/12 2115 04/11/12 1954 04/11/12 1557 04/11/12 1142  GLUCAP 176* 174* 196* 210* 289*      Time coordinating discharge: 60 min  Signed:  Molli Posey, MD [facility] 04/12/2012, 3:30 PM

## 2012-04-21 ENCOUNTER — Encounter (HOSPITAL_BASED_OUTPATIENT_CLINIC_OR_DEPARTMENT_OTHER): Payer: Self-pay | Admitting: Emergency Medicine

## 2012-04-21 ENCOUNTER — Emergency Department (HOSPITAL_BASED_OUTPATIENT_CLINIC_OR_DEPARTMENT_OTHER)
Admission: EM | Admit: 2012-04-21 | Discharge: 2012-04-21 | Disposition: A | Discharge status: Home / Self Care | Payer: Self-pay | Attending: Emergency Medicine | Admitting: Emergency Medicine

## 2012-04-21 ENCOUNTER — Emergency Department (HOSPITAL_BASED_OUTPATIENT_CLINIC_OR_DEPARTMENT_OTHER): Payer: Self-pay

## 2012-04-21 DIAGNOSIS — S92919A Unspecified fracture of unspecified toe(s), initial encounter for closed fracture: Secondary | ICD-10-CM | POA: Insufficient documentation

## 2012-04-21 DIAGNOSIS — Z794 Long term (current) use of insulin: Secondary | ICD-10-CM | POA: Insufficient documentation

## 2012-04-21 DIAGNOSIS — Y929 Unspecified place or not applicable: Secondary | ICD-10-CM | POA: Insufficient documentation

## 2012-04-21 DIAGNOSIS — Z7901 Long term (current) use of anticoagulants: Secondary | ICD-10-CM | POA: Insufficient documentation

## 2012-04-21 DIAGNOSIS — W010XXA Fall on same level from slipping, tripping and stumbling without subsequent striking against object, initial encounter: Secondary | ICD-10-CM | POA: Insufficient documentation

## 2012-04-21 DIAGNOSIS — Z8673 Personal history of transient ischemic attack (TIA), and cerebral infarction without residual deficits: Secondary | ICD-10-CM | POA: Insufficient documentation

## 2012-04-21 DIAGNOSIS — Y939 Activity, unspecified: Secondary | ICD-10-CM | POA: Insufficient documentation

## 2012-04-21 DIAGNOSIS — Z85038 Personal history of other malignant neoplasm of large intestine: Secondary | ICD-10-CM | POA: Insufficient documentation

## 2012-04-21 DIAGNOSIS — E119 Type 2 diabetes mellitus without complications: Secondary | ICD-10-CM | POA: Insufficient documentation

## 2012-04-21 DIAGNOSIS — S82899A Other fracture of unspecified lower leg, initial encounter for closed fracture: Secondary | ICD-10-CM | POA: Insufficient documentation

## 2012-04-21 DIAGNOSIS — M6281 Muscle weakness (generalized): Secondary | ICD-10-CM | POA: Insufficient documentation

## 2012-04-21 DIAGNOSIS — R791 Abnormal coagulation profile: Secondary | ICD-10-CM | POA: Insufficient documentation

## 2012-04-21 DIAGNOSIS — Z79899 Other long term (current) drug therapy: Secondary | ICD-10-CM | POA: Insufficient documentation

## 2012-04-21 LAB — PROTIME-INR
INR: 4.6 — ABNORMAL HIGH (ref 0.00–1.49)
Prothrombin Time: 40.6 seconds — ABNORMAL HIGH (ref 11.6–15.2)

## 2012-04-21 NOTE — ED Notes (Signed)
MD at bedside. 

## 2012-04-21 NOTE — ED Provider Notes (Signed)
History     CSN: [id number]  Arrival date & time 04/21/12  1623   First MD Initiated Contact with Patient 04/21/12 1641      Chief Complaint  Patient presents with  . Ankle Injury    (Consider location/radiation/quality/duration/timing/severity/associated sxs/prior treatment) HPI Pt reports she tripped and fell about 3 days ago injuring her L ankle and R great toe. She has had increasing pain since then, not improved with rest and ACE wrap and unable to bear weight today. She has some weakness on her L side from prior stroke. She also is taking Coumadin, was supposed to get an INR check today but did not go due to pain. Denies head injury or LOC.   Past Medical History  Diagnosis Date  . Diabetes mellitus   . Stroke   . Colon cancer     Past Surgical History  Procedure Date  . Colon surgery   . Esophagogastroduodenoscopy 11/07/2011    Procedure: ESOPHAGOGASTRODUODENOSCOPY (EGD);  Surgeon: Beverley Fiedler, MD;  Location: [facility];  Service: Gastroenterology;  Laterality: N/A;    No family history on file.  History  Substance Use Topics  . Smoking status: Never Smoker   . Smokeless tobacco: Not on file  . Alcohol Use: No    OB History    Grav Para Term Preterm Abortions TAB SAB Ect Mult Living                  Review of Systems All other systems reviewed and are negative except as noted in HPI.   Allergies  Glipizide; Hydrocodone-acetaminophen; Ibuprofen; Iohexol; Morphine; Rosiglitazone-metformin; Sulfamethoxazole; and Pantoprazole  Home Medications   Current Outpatient Rx  Name  Route  Sig  Dispense  Refill  . ENOXAPARIN SODIUM 150 MG/ML [place] SOLN   Subcutaneous   Inject 0.5 mLs (75 mg total) into the skin every 12 (twelve) hours.   6 Syringe   0   . FERROUS SULFATE 325 (65 FE) MG PO TABS   Oral   Take 325 mg by mouth 2 (two) times daily.         . INSULIN ASPART 100 UNIT/ML [place] SOLN   Subcutaneous   Inject 10-35 Units into the skin 2 (two) times  daily. Takes 10 units every night and 34 units every morning          . METFORMIN HCL 1000 MG PO TABS   Oral   Take 1,000 mg by mouth 2 (two) times daily with a meal.         . WARFARIN SODIUM 5 MG PO TABS   Oral   Take 1-1.5 tablets (5-7.5 mg total) by mouth daily. Take 1.5 tablets (7.5mg ) on all days except Tuesday & Thursday take 1 tablet (5mg )           BP 130/64  Pulse 80  Temp 98.3 F (36.8 C) (Oral)  Resp 16  Ht 5\' 4"  (1.626 m)  Wt 165 lb (74.844 kg)  BMI 28.32 kg/m2  SpO2 96%  Physical Exam  Nursing note and vitals reviewed. Constitutional: She is oriented to person, place, and time. She appears well-developed and well-nourished.  HENT:  Head: Normocephalic and atraumatic.  Eyes: EOM are normal. Pupils are equal, round, and reactive to light.  Neck: Normal range of motion. Neck supple.  Cardiovascular: Normal rate, normal heart sounds and intact distal pulses.   Pulmonary/Chest: Effort normal and breath sounds normal.  Abdominal: Bowel sounds are normal. She exhibits no distension. There  is no tenderness.  Musculoskeletal: She exhibits tenderness. She exhibits no edema.       Tender to palpation of L lateral malleolus and R great toe with some bruising  Neurological: She is alert and oriented to person, place, and time. She has normal strength. No cranial nerve deficit or sensory deficit.  Skin: Skin is warm and dry. No rash noted.  Psychiatric: She has a normal mood and affect.    ED Course  Procedures (including critical care time)  Labs Reviewed  PROTIME-INR - Abnormal; Notable for the following:    Prothrombin Time 40.6 (*)     INR 4.60 (*)     All other components within normal limits   Dg Ankle Complete Left  04/21/2012  *RADIOLOGY REPORT*  Clinical Data: Pain and swelling  LEFT ANKLE COMPLETE - 3+ VIEW  Comparison: 12/27/2006  Findings: Diffuse osteopenia.  Ankle mortise intact.  Calcaneal spur at the plantar aponeurosis.  There is a small  avulsion fragment at the inferior aspect of the medial malleolus, new since previous exam.  The fragment is distracted approximately 1 mm.  No other acute bony abnormality.  IMPRESSION:  1.  Small avulsion fragment from the medial malleolus.   Original Report Authenticated By: D. Andria Rhein, MD    Dg Foot Complete Right  04/21/2012  *RADIOLOGY REPORT*  Clinical Data: Pain and bruising.  RIGHT FOOT COMPLETE - 3+ VIEW  Comparison: 03/05/2009  Findings: There is an oblique intra-articular fracture at the lateral margin of the base of the distal phalanx right great toe, distracted less than 1 mm.  No other acute bony abnormality. Otherwise normal mineralization and alignment.  No significant osseous degenerative change.  Small calcaneal spur at the plantar aponeurosis.  IMPRESSION:  1.  Minimally-displaced intra-articular fracture, lateral base distal phalanx right great toe.   Original Report Authenticated By: D. Andria Rhein, MD      No diagnosis found.    MDM  Xrays as above. Buddy tape toe and Cam walker for avulsion. Advised to hold Coumadin for 2 days and follow up with PCP for recheck. Ortho referral for recheck.         Charles B. Bernette Mayers, MD 04/21/12 [phone]

## 2012-04-21 NOTE — ED Notes (Signed)
Fell 3 days ago and hurt left ankle.  Pain getting worse.  Pain radiating to left knee.  Also swelling and discoloration to right great toe.  Pt has been ambulatory since fall.

## 2012-05-17 ENCOUNTER — Emergency Department (HOSPITAL_COMMUNITY): Payer: No Typology Code available for payment source

## 2012-05-17 ENCOUNTER — Encounter (HOSPITAL_COMMUNITY): Payer: Self-pay | Admitting: Emergency Medicine

## 2012-05-17 ENCOUNTER — Emergency Department (HOSPITAL_COMMUNITY)
Admission: EM | Admit: 2012-05-17 | Discharge: 2012-05-17 | Disposition: A | Discharge status: Home / Self Care | Payer: No Typology Code available for payment source | Attending: Emergency Medicine | Admitting: Emergency Medicine

## 2012-05-17 DIAGNOSIS — T148XXA Other injury of unspecified body region, initial encounter: Secondary | ICD-10-CM

## 2012-05-17 DIAGNOSIS — S93409A Sprain of unspecified ligament of unspecified ankle, initial encounter: Secondary | ICD-10-CM | POA: Insufficient documentation

## 2012-05-17 DIAGNOSIS — Z85038 Personal history of other malignant neoplasm of large intestine: Secondary | ICD-10-CM | POA: Insufficient documentation

## 2012-05-17 DIAGNOSIS — R1084 Generalized abdominal pain: Secondary | ICD-10-CM | POA: Insufficient documentation

## 2012-05-17 DIAGNOSIS — Z794 Long term (current) use of insulin: Secondary | ICD-10-CM | POA: Insufficient documentation

## 2012-05-17 DIAGNOSIS — Z79899 Other long term (current) drug therapy: Secondary | ICD-10-CM | POA: Insufficient documentation

## 2012-05-17 DIAGNOSIS — S93402A Sprain of unspecified ligament of left ankle, initial encounter: Secondary | ICD-10-CM

## 2012-05-17 DIAGNOSIS — Z7901 Long term (current) use of anticoagulants: Secondary | ICD-10-CM | POA: Insufficient documentation

## 2012-05-17 DIAGNOSIS — R079 Chest pain, unspecified: Secondary | ICD-10-CM | POA: Insufficient documentation

## 2012-05-17 DIAGNOSIS — Z8673 Personal history of transient ischemic attack (TIA), and cerebral infarction without residual deficits: Secondary | ICD-10-CM | POA: Insufficient documentation

## 2012-05-17 DIAGNOSIS — E119 Type 2 diabetes mellitus without complications: Secondary | ICD-10-CM | POA: Insufficient documentation

## 2012-05-17 DIAGNOSIS — Y939 Activity, unspecified: Secondary | ICD-10-CM | POA: Insufficient documentation

## 2012-05-17 LAB — CBC WITH DIFFERENTIAL/PLATELET
Basophils Absolute: 0 10*3/uL (ref 0.0–0.1)
Eosinophils Relative: 2 % (ref 0–5)
Lymphocytes Relative: 15 % (ref 12–46)
Lymphs Abs: 1.2 10*3/uL (ref 0.7–4.0)
MCV: 76 fL — ABNORMAL LOW (ref 78.0–100.0)
Neutrophils Relative %: 77 % (ref 43–77)
Platelets: 213 10*3/uL (ref 150–400)
RBC: 4.34 MIL/uL (ref 3.87–5.11)
RDW: 14.2 % (ref 11.5–15.5)
WBC: 8.1 10*3/uL (ref 4.0–10.5)

## 2012-05-17 LAB — POCT I-STAT, CHEM 8
Chloride: 104 mEq/L (ref 96–112)
Creatinine, Ser: 0.7 mg/dL (ref 0.50–1.10)
HCT: 34 % — ABNORMAL LOW (ref 36.0–46.0)
Hemoglobin: 11.6 g/dL — ABNORMAL LOW (ref 12.0–15.0)
Potassium: 3.7 mEq/L (ref 3.5–5.1)
Sodium: 140 mEq/L (ref 135–145)

## 2012-05-17 LAB — PROTIME-INR
INR: 1.62 — ABNORMAL HIGH (ref 0.00–1.49)
Prothrombin Time: 18.7 seconds — ABNORMAL HIGH (ref 11.6–15.2)

## 2012-05-17 LAB — BASIC METABOLIC PANEL
CO2: 25 mEq/L (ref 19–32)
Calcium: 8.7 mg/dL (ref 8.4–10.5)
GFR calc non Af Amer: >90 mL/min (ref >90)
Glucose, Bld: 312 mg/dL — ABNORMAL HIGH (ref 70–99)
Potassium: 3.8 mEq/L (ref 3.5–5.1)
Sodium: 145 mEq/L (ref 135–145)

## 2012-05-17 MED ORDER — CYCLOBENZAPRINE HCL 10 MG PO TABS: 10.0000 mg | ORAL_TABLET | Freq: Two times a day (BID) | ORAL | Status: DC | PRN
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
## 2012-06-25 ENCOUNTER — Emergency Department (INDEPENDENT_AMBULATORY_CARE_PROVIDER_SITE_OTHER)
Admission: EM | Admit: 2012-06-25 | Discharge: 2012-06-25 | Discharge status: Against Medical Advice | Payer: Self-pay | Source: Home / Self Care

## 2012-06-25 ENCOUNTER — Encounter (HOSPITAL_COMMUNITY): Payer: Self-pay

## 2012-06-25 NOTE — ED Notes (Signed)
Patient of [facility], now no PCP, since [name]  Has declined to let her schedule an appointment due to finances

## 2012-07-17 ENCOUNTER — Other Ambulatory Visit: Payer: Self-pay

## 2012-08-20 ENCOUNTER — Encounter (HOSPITAL_COMMUNITY): Payer: Self-pay | Admitting: Emergency Medicine

## 2012-08-20 ENCOUNTER — Emergency Department (HOSPITAL_COMMUNITY)
Admission: EM | Admit: 2012-08-20 | Discharge: 2012-08-20 | Discharge status: Against Medical Advice | Payer: Self-pay | Attending: Emergency Medicine | Admitting: Emergency Medicine

## 2012-08-20 DIAGNOSIS — R110 Nausea: Secondary | ICD-10-CM | POA: Insufficient documentation

## 2012-08-20 DIAGNOSIS — Z7901 Long term (current) use of anticoagulants: Secondary | ICD-10-CM | POA: Insufficient documentation

## 2012-08-20 DIAGNOSIS — R6883 Chills (without fever): Secondary | ICD-10-CM | POA: Insufficient documentation

## 2012-08-20 DIAGNOSIS — R5381 Other malaise: Secondary | ICD-10-CM | POA: Insufficient documentation

## 2012-08-20 DIAGNOSIS — R5383 Other fatigue: Secondary | ICD-10-CM | POA: Insufficient documentation

## 2012-08-20 DIAGNOSIS — R51 Headache: Secondary | ICD-10-CM | POA: Insufficient documentation

## 2012-08-20 DIAGNOSIS — R109 Unspecified abdominal pain: Secondary | ICD-10-CM | POA: Insufficient documentation

## 2012-08-20 DIAGNOSIS — E119 Type 2 diabetes mellitus without complications: Secondary | ICD-10-CM | POA: Insufficient documentation

## 2012-08-20 NOTE — ED Notes (Signed)
Pt states a couple of days ago she noticed some blood in her stools so she stopped her coumadin for a day and states she did not have any blood in her stool today so she took her dose today

## 2012-08-20 NOTE — ED Notes (Signed)
Pt not in waiting room x 2; seen walking out of ER

## 2012-08-20 NOTE — ED Notes (Signed)
Pt is c/o headache, generalized weakness, abd pain, nausea, and chills  Pt states sxs started this morning

## 2012-09-22 ENCOUNTER — Emergency Department (HOSPITAL_COMMUNITY)
Admission: EM | Admit: 2012-09-22 | Discharge: 2012-09-22 | Disposition: A | Discharge status: Home / Self Care | Payer: Self-pay | Attending: Emergency Medicine | Admitting: Emergency Medicine

## 2012-09-22 ENCOUNTER — Encounter (HOSPITAL_COMMUNITY): Payer: Self-pay | Admitting: *Deleted

## 2012-09-22 DIAGNOSIS — E119 Type 2 diabetes mellitus without complications: Secondary | ICD-10-CM | POA: Insufficient documentation

## 2012-09-22 DIAGNOSIS — Z7901 Long term (current) use of anticoagulants: Secondary | ICD-10-CM | POA: Insufficient documentation

## 2012-09-22 DIAGNOSIS — Z79899 Other long term (current) drug therapy: Secondary | ICD-10-CM | POA: Insufficient documentation

## 2012-09-22 DIAGNOSIS — L293 Anogenital pruritus, unspecified: Secondary | ICD-10-CM | POA: Insufficient documentation

## 2012-09-22 DIAGNOSIS — Z3202 Encounter for pregnancy test, result negative: Secondary | ICD-10-CM | POA: Insufficient documentation

## 2012-09-22 DIAGNOSIS — Z90710 Acquired absence of both cervix and uterus: Secondary | ICD-10-CM | POA: Insufficient documentation

## 2012-09-22 DIAGNOSIS — Z794 Long term (current) use of insulin: Secondary | ICD-10-CM | POA: Insufficient documentation

## 2012-09-22 DIAGNOSIS — R110 Nausea: Secondary | ICD-10-CM | POA: Insufficient documentation

## 2012-09-22 DIAGNOSIS — Z8673 Personal history of transient ischemic attack (TIA), and cerebral infarction without residual deficits: Secondary | ICD-10-CM | POA: Insufficient documentation

## 2012-09-22 DIAGNOSIS — N390 Urinary tract infection, site not specified: Secondary | ICD-10-CM | POA: Insufficient documentation

## 2012-09-22 DIAGNOSIS — Z85038 Personal history of other malignant neoplasm of large intestine: Secondary | ICD-10-CM | POA: Insufficient documentation

## 2012-09-22 LAB — URINALYSIS, ROUTINE W REFLEX MICROSCOPIC
Nitrite: NEGATIVE
Specific Gravity, Urine: 1.03 (ref 1.005–1.030)
Urobilinogen, UA: 0.2 mg/dL (ref 0.0–1.0)
pH: 5.5 (ref 5.0–8.0)

## 2012-09-22 LAB — PREGNANCY, URINE: Preg Test, Ur: NEGATIVE

## 2012-09-22 LAB — WET PREP, GENITAL
Trich, Wet Prep: NONE SEEN
Yeast Wet Prep HPF POC: NONE SEEN

## 2012-09-22 LAB — PROTIME-INR: Prothrombin Time: 13.4 seconds (ref 11.6–15.2)

## 2012-09-22 LAB — URINE MICROSCOPIC-ADD ON

## 2012-09-22 MED ORDER — CEPHALEXIN 500 MG PO CAPS: 500.0000 mg | ORAL_CAPSULE | Freq: Four times a day (QID) | ORAL | Status: DC

## 2012-09-22 MED ORDER — CEPHALEXIN 500 MG PO CAPS
500.0000 mg | ORAL_CAPSULE | Freq: Once | ORAL | Status: AC
  Administered 2012-09-22: 500 mg via ORAL
  Filled 2012-09-22: qty 1

## 2012-09-22 NOTE — ED Notes (Signed)
MD at bedside. Dr. Campos. 

## 2012-09-22 NOTE — ED Notes (Signed)
Spoke with [facility]- ok to transfer pt to pysch ED.

## 2012-09-22 NOTE — ED Notes (Signed)
Pt from home with reports of dysuria, vaginal itching/irritation and nausea that started yesterday.

## 2012-09-22 NOTE — ED Provider Notes (Signed)
History     CSN: [id number]  Arrival date & time 09/22/12  0716   First MD Initiated Contact with Patient 09/22/12 [phone]      Chief Complaint  Patient presents with  . Dysuria  . Vaginal Itching  . Nausea    The history is provided by the patient.   the patient reports developing dysuria over the past 24 hours.  She's had some vaginal itching and irritation on the outside of her vagina for the past 24 hours.  No vaginal discharge.  She is status post hysterectomy.  No vaginal bleeding.  Mild nausea yesterday but none today.  No vomiting.  No fevers or chills.  No abdominal pain or back pain.  No other complaints.  Symptoms are mild in severity.  Nothing worsens or improves her symptoms  Past Medical History  Diagnosis Date  . Diabetes mellitus   . Stroke   . Colon cancer     Past Surgical History  Procedure Laterality Date  . Colon surgery    . Esophagogastroduodenoscopy  11/07/2011    Procedure: ESOPHAGOGASTRODUODENOSCOPY (EGD);  Surgeon: Beverley Fiedler, MD;  Location: [facility];  Service: Gastroenterology;  Laterality: N/A;    Family History  Problem Relation Age of Onset  . Cancer Other   . Diabetes Other     History  Substance Use Topics  . Smoking status: Never Smoker   . Smokeless tobacco: Never Used  . Alcohol Use: No    OB History   Grav Para Term Preterm Abortions TAB SAB Ect Mult Living                  Review of Systems  Genitourinary: Positive for dysuria.  All other systems reviewed and are negative.    Allergies  Glipizide; Ibuprofen; Iohexol; Rosiglitazone-metformin; Hydrocodone-acetaminophen; Morphine; Pantoprazole; and Sulfamethoxazole  Home Medications   Current Outpatient Rx  Name  Route  Sig  Dispense  Refill  . ferrous sulfate 325 (65 FE) MG tablet   Oral   Take 325 mg by mouth 2 (two) times daily.         . insulin aspart (NOVOLOG) 100 UNIT/ML injection   Subcutaneous   Inject 10-35 Units into the skin 2 (two) times daily.  Takes 10 units every night and 34 units every morning         . metFORMIN (GLUCOPHAGE) 1000 MG tablet   Oral   Take 1,000 mg by mouth 2 (two) times daily with a meal.         . warfarin (COUMADIN) 5 MG tablet   Oral   Take 1-1.5 tablets (5-7.5 mg total) by mouth daily. Take 1.5 tablets (7.5mg ) on all days except Tuesday & Thursday take 1 tablet (5mg )           BP 123/78  Pulse 72  Temp(Src) 97.8 F (36.6 C) (Oral)  Resp 16  Wt 171 lb (77.565 kg)  BMI 29.34 kg/m2  SpO2 97%  Physical Exam  Nursing note and vitals reviewed. Constitutional: She is oriented to person, place, and time. She appears well-developed and well-nourished. No distress.  HENT:  Head: Normocephalic and atraumatic.  Eyes: EOM are normal.  Neck: Normal range of motion.  Cardiovascular: Normal rate, regular rhythm and normal heart sounds.   Pulmonary/Chest: Effort normal and breath sounds normal.  Abdominal: Soft. She exhibits no distension. There is no tenderness.  Genitourinary:  Normal external genitalia.  No significant vaginal discharge.  Uterus absent.  Musculoskeletal: Normal range  of motion.  Neurological: She is alert and oriented to person, place, and time.  Skin: Skin is warm and dry.  Psychiatric: She has a normal mood and affect. Judgment normal.    ED Course  Procedures (including critical care time)  Labs Reviewed  WET PREP, GENITAL - Abnormal; Notable for the following:    WBC, Wet Prep HPF POC RARE (*)    All other components within normal limits  URINALYSIS, ROUTINE W REFLEX MICROSCOPIC - Abnormal; Notable for the following:    APPearance CLOUDY (*)    Glucose, UA >1000 (*)    Hgb urine dipstick SMALL (*)    Leukocytes, UA MODERATE (*)    All other components within normal limits  URINE MICROSCOPIC-ADD ON - Abnormal; Notable for the following:    Bacteria, UA FEW (*)    All other components within normal limits  URINE CULTURE  PREGNANCY, URINE  PROTIME-INR   No results  found.   1. Urinary tract infection       MDM  Urinary tract infection without significant signs of pyelonephritis.  Keflex in the emergency department.  Home with Keflex.  The patient requests that her INR be checked.  The patient will be contacted with the results of her INR.  [phone]      Lyanne Co, MD 09/22/12 [phone]

## 2012-09-24 ENCOUNTER — Telehealth (HOSPITAL_COMMUNITY): Payer: Self-pay | Admitting: Emergency Medicine

## 2012-09-24 LAB — URINE CULTURE: Colony Count: 80000

## 2012-10-04 ENCOUNTER — Encounter (HOSPITAL_COMMUNITY): Payer: Self-pay | Admitting: Emergency Medicine

## 2012-10-04 ENCOUNTER — Other Ambulatory Visit (HOSPITAL_COMMUNITY)
Admission: RE | Admit: 2012-10-04 | Discharge: 2012-10-04 | Disposition: A | Discharge status: Home / Self Care | Payer: Self-pay | Source: Ambulatory Visit | Attending: Family Medicine | Admitting: Family Medicine

## 2012-10-04 ENCOUNTER — Emergency Department (INDEPENDENT_AMBULATORY_CARE_PROVIDER_SITE_OTHER)
Admission: EM | Admit: 2012-10-04 | Discharge: 2012-10-04 | Disposition: A | Discharge status: Home / Self Care | Payer: Self-pay | Source: Home / Self Care

## 2012-10-04 DIAGNOSIS — B373 Candidiasis of vulva and vagina: Secondary | ICD-10-CM

## 2012-10-04 DIAGNOSIS — N760 Acute vaginitis: Secondary | ICD-10-CM | POA: Insufficient documentation

## 2012-10-04 LAB — POCT URINALYSIS DIP (DEVICE)
Bilirubin Urine: NEGATIVE
Leukocytes, UA: NEGATIVE
Nitrite: NEGATIVE
Protein, ur: NEGATIVE mg/dL
Urobilinogen, UA: 0.2 mg/dL (ref 0.0–1.0)
pH: 5 (ref 5.0–8.0)

## 2012-10-04 MED ORDER — FLUCONAZOLE 150 MG PO TABS: 150.0000 mg | ORAL_TABLET | Freq: Once | ORAL | Status: DC

## 2012-10-04 MED ORDER — TERCONAZOLE 80 MG VA SUPP: 80.0000 mg | Freq: Every day | VAGINAL | Status: DC

## 2012-10-04 NOTE — ED Provider Notes (Signed)
History     CSN: [id number]  Arrival date & time 10/04/12  1351   None     Chief Complaint  Patient presents with  . Vaginitis    (Consider location/radiation/quality/duration/timing/severity/associated sxs/prior treatment) Patient is a 57 y.o. female presenting with vaginal itching. The history is provided by the patient.  Vaginal Itching This is a new problem. The current episode started more than 2 days ago. The problem has been gradually worsening. Pertinent negatives include no abdominal pain. Associated symptoms comments: Finished abx from ER on fri for uti, sx resolved but on sat began with vag itch and irritation..    Past Medical History  Diagnosis Date  . Diabetes mellitus   . Stroke   . Colon cancer     Past Surgical History  Procedure Laterality Date  . Colon surgery    . Esophagogastroduodenoscopy  11/07/2011    Procedure: ESOPHAGOGASTRODUODENOSCOPY (EGD);  Surgeon: Beverley Fiedler, MD;  Location: [facility];  Service: Gastroenterology;  Laterality: N/A;    Family History  Problem Relation Age of Onset  . Cancer Other   . Diabetes Other     History  Substance Use Topics  . Smoking status: Never Smoker   . Smokeless tobacco: Never Used  . Alcohol Use: No    OB History   Grav Para Term Preterm Abortions TAB SAB Ect Mult Living                  Review of Systems  Constitutional: Negative.   Gastrointestinal: Negative for nausea, vomiting and abdominal pain.  Genitourinary: Positive for vaginal discharge. Negative for vaginal bleeding.    Allergies  Glipizide; Ibuprofen; Iohexol; Rosiglitazone-metformin; Hydrocodone-acetaminophen; Morphine; Pantoprazole; and Sulfamethoxazole  Home Medications   Current Outpatient Rx  Name  Route  Sig  Dispense  Refill  . cephALEXin (KEFLEX) 500 MG capsule   Oral   Take 1 capsule (500 mg total) by mouth 4 (four) times daily.   20 capsule   0   . ferrous sulfate 325 (65 FE) MG tablet   Oral   Take 325 mg by  mouth 2 (two) times daily.         . fluconazole (DIFLUCAN) 150 MG tablet   Oral   Take 1 tablet (150 mg total) by mouth once.   1 tablet   0   . insulin aspart (NOVOLOG) 100 UNIT/ML injection   Subcutaneous   Inject 10-35 Units into the skin 2 (two) times daily. Takes 10 units every night and 34 units every morning         . metFORMIN (GLUCOPHAGE) 1000 MG tablet   Oral   Take 1,000 mg by mouth 2 (two) times daily with a meal.         . terconazole (TERAZOL 3) 80 MG vaginal suppository   Vaginal   Place 1 suppository (80 mg total) vaginally at bedtime.   3 suppository   0   . warfarin (COUMADIN) 5 MG tablet   Oral   Take 1-1.5 tablets (5-7.5 mg total) by mouth daily. Take 1.5 tablets (7.5mg ) on all days except Tuesday & Thursday take 1 tablet (5mg )           BP 122/70  Pulse 79  Temp(Src) 98 F (36.7 C) (Oral)  Resp 18  SpO2 98%  Physical Exam  Nursing note and vitals reviewed. Constitutional: She is oriented to person, place, and time. She appears well-developed and well-nourished.  Abdominal: Soft. Bowel sounds are normal.  Genitourinary: There is erythema around the vagina. Vaginal discharge found.    Neurological: She is alert and oriented to person, place, and time.  Skin: Skin is warm and dry.    ED Course  Procedures (including critical care time)  Labs Reviewed  POCT URINALYSIS DIP (DEVICE) - Abnormal; Notable for the following:    Glucose, UA 500 (*)    Hgb urine dipstick TRACE (*)    All other components within normal limits  CERVICOVAGINAL ANCILLARY ONLY   No results found.   1. Candida vaginitis       MDM          Linna Hoff, MD 10/04/12 [phone]

## 2012-10-04 NOTE — ED Notes (Signed)
Pt c/o poss yeast infection onset 2 days Reports being seen on 09/22/12 for a UTI; given antibiotics that she finished on Friday; tolerated well Sx include: irritation w/mild pain; abd/pelvic pain, urgency Has not noticed any vag discharge, hematuria, dysuria, f/v/n/d  She is alert and oriented w/no signs of acute distress.

## 2013-04-07 ENCOUNTER — Other Ambulatory Visit: Payer: Self-pay

## 2013-05-11 ENCOUNTER — Encounter: Payer: Self-pay | Admitting: Internal Medicine

## 2013-08-30 ENCOUNTER — Emergency Department (HOSPITAL_COMMUNITY)
Admission: EM | Admit: 2013-08-30 | Discharge: 2013-08-30 | Disposition: A | Discharge status: Home / Self Care | Payer: Medicare Other | Attending: Emergency Medicine | Admitting: Emergency Medicine

## 2013-08-30 ENCOUNTER — Encounter (HOSPITAL_COMMUNITY): Payer: Self-pay | Admitting: Emergency Medicine

## 2013-08-30 DIAGNOSIS — J069 Acute upper respiratory infection, unspecified: Secondary | ICD-10-CM

## 2013-08-30 DIAGNOSIS — Z85038 Personal history of other malignant neoplasm of large intestine: Secondary | ICD-10-CM | POA: Insufficient documentation

## 2013-08-30 DIAGNOSIS — E119 Type 2 diabetes mellitus without complications: Secondary | ICD-10-CM | POA: Insufficient documentation

## 2013-08-30 DIAGNOSIS — B9789 Other viral agents as the cause of diseases classified elsewhere: Secondary | ICD-10-CM

## 2013-08-30 DIAGNOSIS — J029 Acute pharyngitis, unspecified: Secondary | ICD-10-CM

## 2013-08-30 DIAGNOSIS — Z794 Long term (current) use of insulin: Secondary | ICD-10-CM | POA: Insufficient documentation

## 2013-08-30 DIAGNOSIS — Z79899 Other long term (current) drug therapy: Secondary | ICD-10-CM | POA: Insufficient documentation

## 2013-08-30 DIAGNOSIS — IMO0002 Reserved for concepts with insufficient information to code with codable children: Secondary | ICD-10-CM | POA: Insufficient documentation

## 2013-08-30 DIAGNOSIS — Z8673 Personal history of transient ischemic attack (TIA), and cerebral infarction without residual deficits: Secondary | ICD-10-CM | POA: Insufficient documentation

## 2013-08-30 DIAGNOSIS — Z7982 Long term (current) use of aspirin: Secondary | ICD-10-CM | POA: Insufficient documentation

## 2013-08-30 LAB — RAPID STREP SCREEN (MED CTR MEBANE ONLY): STREPTOCOCCUS, GROUP A SCREEN (DIRECT): NEGATIVE

## 2013-08-30 MED ORDER — FLUTICASONE PROPIONATE 50 MCG/ACT NA SUSP: 2.0000 | Freq: Every day | NASAL | Status: DC

## 2013-08-30 MED ORDER — MAGIC MOUTHWASH W/LIDOCAINE: 5.0000 mL | Freq: Four times a day (QID) | ORAL | Status: DC | PRN

## 2013-08-30 MED ORDER — BENZONATATE 100 MG PO CAPS: 100.0000 mg | ORAL_CAPSULE | Freq: Three times a day (TID) | ORAL | Status: DC

## 2013-08-30 MED ORDER — DEXAMETHASONE SODIUM PHOSPHATE 10 MG/ML IJ SOLN
10.0000 mg | Freq: Once | INTRAMUSCULAR | Status: AC
  Administered 2013-08-30: 10 mg via INTRAMUSCULAR
  Filled 2013-08-30: qty 1

## 2013-08-30 NOTE — Discharge Instructions (Signed)
Recommend that you get plenty of rest and drink plenty of fluids. Take Tessalon as prescribed for cough. You may use Flonase as prescribed for nasal congestion. You may also try nasal saline sprays or saline sinus versus if you prefer not to use Flonase. You may try over-the-counter remedies such as Mucinex DM for cough and congestion. Follow up with your primary care provider by the end of the week.  Cough, Adult  A cough is a reflex that helps clear your throat and airways. It can help heal the body or may be a reaction to an irritated airway. A cough may only last 2 or 3 weeks (acute) or may last more than 8 weeks (chronic).  CAUSES Acute cough:  Viral or bacterial infections. Chronic cough:  Infections.  Allergies.  Asthma.  Post-nasal drip.  Smoking.  Heartburn or acid reflux.  Some medicines.  Chronic lung problems (COPD).  Cancer. SYMPTOMS   Cough.  Fever.  Chest pain.  Increased breathing rate.  High-pitched whistling sound when breathing (wheezing).  Colored mucus that you cough up (sputum). TREATMENT   A bacterial cough may be treated with antibiotic medicine.  A viral cough must run its course and will not respond to antibiotics.  Your caregiver may recommend other treatments if you have a chronic cough. HOME CARE INSTRUCTIONS   Only take over-the-counter or prescription medicines for pain, discomfort, or fever as directed by your caregiver. Use cough suppressants only as directed by your caregiver.  Use a cold steam vaporizer or humidifier in your bedroom or home to help loosen secretions.  Sleep in a semi-upright position if your cough is worse at night.  Rest as needed.  Stop smoking if you smoke. SEEK IMMEDIATE MEDICAL CARE IF:   You have pus in your sputum.  Your cough starts to worsen.  You cannot control your cough with suppressants and are losing sleep.  You begin coughing up blood.  You have difficulty breathing.  You develop  pain which is getting worse or is uncontrolled with medicine.  You have a fever. MAKE SURE YOU:   Understand these instructions.  Will watch your condition.  Will get help right away if you are not doing well or get worse. Document Released: 11/15/2010 Document Revised: 08/11/2011 Document Reviewed: 11/15/2010 [facility] Patient Information 2014 [place]. Salt Water Gargle This solution will help make your mouth and throat feel better. HOME CARE INSTRUCTIONS   Mix 1 teaspoon of salt in 8 ounces of warm water.  Gargle with this solution as much or often as you need or as directed. Swish and gargle gently if you have any sores or wounds in your mouth.  Do not swallow this mixture. Document Released: 02/21/2004 Document Revised: 08/11/2011 Document Reviewed: 07/14/2008 [facility] Patient Information 2014 [place].

## 2013-08-30 NOTE — ED Notes (Signed)
Pt states she has a sore throat  Pt states it started 3 days ago  Pt states it hurts to cough and swallow  Pt states when she swallows it makes her right ear hurt

## 2013-08-31 LAB — CULTURE, GROUP A STREP

## 2013-08-31 NOTE — ED Provider Notes (Signed)
CSN: [id number]     Arrival date & time 08/30/13  0301 History   First MD Initiated Contact with Patient 08/30/13 0335     Chief Complaint  Patient presents with  . Sore Throat     (Consider location/radiation/quality/duration/timing/severity/associated sxs/prior Treatment) Patient is a 58 y.o. female presenting with pharyngitis. The history is provided by the patient. No language interpreter was used.  Sore Throat This is a new problem. Episode onset: 3 days ago. The problem occurs constantly. The problem has been gradually worsening. Associated symptoms include congestion, coughing and a sore throat. Pertinent negatives include no chest pain, diaphoresis, fever, neck pain or vomiting. Associated symptoms comments: +otalgia. The symptoms are aggravated by swallowing and coughing. Treatments tried: OTC remedies. The treatment provided no relief.    Past Medical History  Diagnosis Date  . Diabetes mellitus   . Stroke   . Colon cancer    Past Surgical History  Procedure Laterality Date  . Colon surgery    . Esophagogastroduodenoscopy  11/07/2011    Procedure: ESOPHAGOGASTRODUODENOSCOPY (EGD);  Surgeon: Jerene Bears, MD;  Location: [facility];  Service: Gastroenterology;  Laterality: N/A;   Family History  Problem Relation Age of Onset  . Cancer Other   . Diabetes Other    History  Substance Use Topics  . Smoking status: Never Smoker   . Smokeless tobacco: Never Used  . Alcohol Use: No   OB History   Grav Para Term Preterm Abortions TAB SAB Ect Mult Living                 Review of Systems  Constitutional: Negative for fever and diaphoresis.  HENT: Positive for congestion, sinus pressure and sore throat. Negative for drooling and trouble swallowing (pain/discomfort only).   Respiratory: Positive for cough. Negative for shortness of breath.   Cardiovascular: Negative for chest pain.  Gastrointestinal: Negative for vomiting.  Musculoskeletal: Negative for neck pain.  All  other systems reviewed and are negative.      Allergies  Glipizide; Ibuprofen; Iohexol; Rosiglitazone-metformin; Hydrocodone-acetaminophen; Morphine; Pantoprazole; and Sulfamethoxazole  Home Medications   Current Outpatient Rx  Name  Route  Sig  Dispense  Refill  . aspirin EC 81 MG tablet   Oral   Take 81 mg by mouth daily.         . ferrous sulfate 325 (65 FE) MG tablet   Oral   Take 325 mg by mouth 2 (two) times daily.         . insulin aspart (NOVOLOG) 100 UNIT/ML injection   Subcutaneous   Inject 20-30 Units into the skin 2 (two) times daily. Takes 20 units every night and 30units every morning         . LOVASTATIN PO   Oral   Take 1 tablet by mouth daily. Patient does not know strength         . metFORMIN (GLUCOPHAGE) 1000 MG tablet   Oral   Take 1,000 mg by mouth 2 (two) times daily with a meal.         . Alum & Mag Hydroxide-Simeth (MAGIC MOUTHWASH W/LIDOCAINE) SOLN   Oral   Take 5 mLs by mouth 4 (four) times daily as needed for mouth pain. Use for sore throat   100 mL   0     Lidocaine, benadryl, maalox   . benzonatate (TESSALON) 100 MG capsule   Oral   Take 1 capsule (100 mg total) by mouth every 8 (eight) hours. Take for  cough   21 capsule   0   . fluticasone (FLONASE) 50 MCG/ACT nasal spray   Each Nare   Place 2 sprays into both nostrils daily.   16 g   0    BP 119/67  Pulse 80  Temp(Src) 98 F (36.7 C) (Oral)  Resp 18  SpO2 96%  Physical Exam  Nursing note and vitals reviewed. Constitutional: She is oriented to person, place, and time. She appears well-developed and well-nourished. No distress.  HENT:  Head: Normocephalic and atraumatic.  Right Ear: Hearing, tympanic membrane, external ear and ear canal normal. No mastoid tenderness.  Left Ear: Hearing, tympanic membrane, external ear and ear canal normal. No mastoid tenderness.  Mouth/Throat: Uvula is midline and mucous membranes are normal. No trismus in the jaw. Posterior  oropharyngeal edema and posterior oropharyngeal erythema present. No oropharyngeal exudate or tonsillar abscesses.  +Audible nasal congestion. No rhinorrhea. Oropharynx erythematous and mildly edematous. Uvula mildine and patient tolerating secretions without difficulty or drooling. No exudates.   Eyes: Conjunctivae and EOM are normal. No scleral icterus.  Neck: Normal range of motion. Neck supple.  Cardiovascular: Normal rate, regular rhythm and normal heart sounds.   Pulmonary/Chest: Effort normal and breath sounds normal. No stridor. No respiratory distress. She has no wheezes. She has no rales.  Musculoskeletal: Normal range of motion.  Lymphadenopathy:    She has cervical adenopathy (mild).  Neurological: She is alert and oriented to person, place, and time.  Skin: Skin is warm and dry. No rash noted. She is not diaphoretic. No erythema. No pallor.  Psychiatric: She has a normal mood and affect. Her behavior is normal.    ED Course  Procedures (including critical care time) Labs Review Labs Reviewed  RAPID STREP SCREEN  CULTURE, GROUP A STREP   Imaging Review No results found.   EKG Interpretation None      MDM   Final diagnoses:  Viral pharyngitis  Viral URI with cough    Pt afebrile without tonsillar exudate, negative strep. Presents with mild cervical lymphadenopathy, & dysphagia; diagnosis of viral pharyngitis and viral URI. No abx indicated. DC w symptomatic tx for pain; patient given decadron IM in ED for symptoms. Pt does not appear dehydrated, but did discuss importance of fluid hydration. Presentation non concerning for PTA or infxn spread to soft tissue. No trismus or uvula deviation. Specific return precautions discussed. Pt able to drink water in ED without difficulty with intact air way; tolerating secretions without difficulty Recommended PCP follow up.   Filed Vitals:   08/30/13 0310 08/30/13 0500  BP: 131/56 119/67  Pulse: 84 80  Temp: 97.8 F (36.6 C)  98 F (36.7 C)  TempSrc: Oral Oral  Resp: 18 18  SpO2: 96% 96%     Antonietta Breach, PA-C 08/31/13 2036

## 2013-09-02 NOTE — ED Provider Notes (Signed)
Medical screening examination/treatment/procedure(s) were conducted as a shared visit with non-physician practitioner(s) or resident  and myself.  I personally evaluated the patient during the encounter and agree with the findings and plan unless otherwise indicated.    I have personally reviewed any xrays and/ or EKG's with the provider and I agree with interpretation.  Labs Reviewed  RAPID STREP SCREEN  CULTURE, GROUP A STREP    3 days pharyngitis, gradually worsening.  Well appearing, No trismus, uvular deviation, unilateral posterior pharyngeal edema or submandibular swelling.  Strep neg.  Fup outpt discussed.   Filed Vitals:   08/30/13 0310 08/30/13 0500  BP: 131/56 119/67  Pulse: 84 80  Temp: 97.8 F (36.6 C) 98 F (36.7 C)  TempSrc: Oral Oral  Resp: 18 18  SpO2: 96% 96%   Acute pharyngitis  Mariea Clonts, MD 09/02/13 2039

## 2013-09-30 ENCOUNTER — Encounter (HOSPITAL_COMMUNITY): Payer: Self-pay | Admitting: Emergency Medicine

## 2013-09-30 ENCOUNTER — Emergency Department (INDEPENDENT_AMBULATORY_CARE_PROVIDER_SITE_OTHER)
Admission: EM | Admit: 2013-09-30 | Discharge: 2013-09-30 | Disposition: A | Discharge status: Home / Self Care | Payer: Medicare Other | Source: Home / Self Care | Attending: Family Medicine | Admitting: Family Medicine

## 2013-09-30 DIAGNOSIS — N390 Urinary tract infection, site not specified: Secondary | ICD-10-CM

## 2013-09-30 LAB — POCT URINALYSIS DIP (DEVICE)
Bilirubin Urine: NEGATIVE
Glucose, UA: 500 mg/dL — AB
Hgb urine dipstick: NEGATIVE
Ketones, ur: NEGATIVE mg/dL
LEUKOCYTES UA: NEGATIVE
NITRITE: POSITIVE — AB
PROTEIN: NEGATIVE mg/dL
Specific Gravity, Urine: 1.025 (ref 1.005–1.030)
UROBILINOGEN UA: 0.2 mg/dL (ref 0.0–1.0)
pH: 5 (ref 5.0–8.0)

## 2013-09-30 MED ORDER — CEPHALEXIN 500 MG PO CAPS: 500.0000 mg | ORAL_CAPSULE | Freq: Four times a day (QID) | ORAL | Status: DC

## 2013-09-30 MED ORDER — FLUCONAZOLE 150 MG PO TABS: 150.0000 mg | ORAL_TABLET | Freq: Once | ORAL | Status: DC

## 2013-09-30 NOTE — Discharge Instructions (Signed)
Take all of medicine as directed, drink lots of fluids, see your doctor if further problems. °

## 2013-09-30 NOTE — ED Notes (Signed)
C/o pressure, frequency, burning w urination, pain at end of UA stream. Feels like "another urinary tract infection". Denies STD concerns today

## 2013-09-30 NOTE — ED Provider Notes (Signed)
CSN: [id number]     Arrival date & time 09/30/13  4403 History   First MD Initiated Contact with Patient 09/30/13 0913     Chief Complaint  Patient presents with  . Urinary Tract Infection   (Consider location/radiation/quality/duration/timing/severity/associated sxs/prior Treatment) Patient is a 58 y.o. female presenting with urinary tract infection. The history is provided by the patient.  Urinary Tract Infection This is a new problem. The current episode started more than 2 days ago. The problem has been gradually worsening (bs 160 this am, no worsening of dm cond.). Pertinent negatives include no chest pain, no abdominal pain and no headaches.    Past Medical History  Diagnosis Date  . Diabetes mellitus   . Stroke   . Colon cancer    Past Surgical History  Procedure Laterality Date  . Colon surgery    . Esophagogastroduodenoscopy  11/07/2011    Procedure: ESOPHAGOGASTRODUODENOSCOPY (EGD);  Surgeon: Jerene Bears, MD;  Location: [facility];  Service: Gastroenterology;  Laterality: N/A;  . Cholecystectomy     Family History  Problem Relation Age of Onset  . Cancer Other   . Diabetes Other    History  Substance Use Topics  . Smoking status: Never Smoker   . Smokeless tobacco: Never Used  . Alcohol Use: No   OB History   Grav Para Term Preterm Abortions TAB SAB Ect Mult Living                 Review of Systems  Constitutional: Negative.   Cardiovascular: Negative for chest pain.  Gastrointestinal: Negative.  Negative for abdominal pain.  Genitourinary: Positive for dysuria, urgency and frequency. Negative for vaginal bleeding, vaginal discharge and pelvic pain.  Neurological: Negative for headaches.    Allergies  Glipizide; Ibuprofen; Iohexol; Rosiglitazone-metformin; Hydrocodone-acetaminophen; Morphine; Pantoprazole; and Sulfamethoxazole  Home Medications   Prior to Admission medications   Medication Sig Start Date End Date Taking? Authorizing Provider  aspirin  EC 81 MG tablet Take 81 mg by mouth daily.   Yes Historical Provider, MD  insulin aspart (NOVOLOG) 100 UNIT/ML injection Inject 20-30 Units into the skin 2 (two) times daily. Takes 20 units every night and 30units every morning   Yes Historical Provider, MD  metFORMIN (GLUCOPHAGE) 1000 MG tablet Take 1,000 mg by mouth 2 (two) times daily with a meal.   Yes Historical Provider, MD  Alum & Mag Hydroxide-Simeth (MAGIC MOUTHWASH W/LIDOCAINE) SOLN Take 5 mLs by mouth 4 (four) times daily as needed for mouth pain. Use for sore throat 08/30/13   Antonietta Breach, PA-C  benzonatate (TESSALON) 100 MG capsule Take 1 capsule (100 mg total) by mouth every 8 (eight) hours. Take for cough 08/30/13   Antonietta Breach, PA-C  ferrous sulfate 325 (65 FE) MG tablet Take 325 mg by mouth 2 (two) times daily.    Historical Provider, MD  fluticasone (FLONASE) 50 MCG/ACT nasal spray Place 2 sprays into both nostrils daily. 08/30/13   Antonietta Breach, PA-C  LOVASTATIN PO Take 1 tablet by mouth daily. Patient does not know strength    Historical Provider, MD   BP 125/75  Pulse 83  Temp(Src) 97.1 F (36.2 C)  Resp 18  SpO2 95% Physical Exam  Nursing note and vitals reviewed. Constitutional: She is oriented to person, place, and time. She appears well-developed and well-nourished.  Neck: Normal range of motion. Neck supple.  Abdominal: Soft. Bowel sounds are normal. She exhibits no distension and no mass. There is no tenderness. There is no  rebound and no guarding.  Neurological: She is alert and oriented to person, place, and time.  Skin: Skin is warm and dry.    ED Course  Procedures (including critical care time) Labs Review Labs Reviewed  POCT URINALYSIS DIP (DEVICE) - Abnormal; Notable for the following:    Glucose, UA 500 (*)    Nitrite POSITIVE (*)    All other components within normal limits    Imaging Review No results found.   MDM   1. UTI (lower urinary tract infection)        Billy Fischer, MD 09/30/13  [phone]

## 2013-11-02 ENCOUNTER — Encounter: Payer: Self-pay | Admitting: Internal Medicine

## 2014-03-17 ENCOUNTER — Other Ambulatory Visit: Payer: Self-pay

## 2014-05-16 ENCOUNTER — Emergency Department (HOSPITAL_COMMUNITY)
Admission: EM | Admit: 2014-05-16 | Discharge: 2014-05-16 | Disposition: A | Discharge status: Home / Self Care | Payer: Medicare Other | Source: Home / Self Care | Attending: Family Medicine | Admitting: Family Medicine

## 2014-05-16 ENCOUNTER — Encounter (HOSPITAL_COMMUNITY): Payer: Self-pay | Admitting: Emergency Medicine

## 2014-05-16 DIAGNOSIS — J069 Acute upper respiratory infection, unspecified: Secondary | ICD-10-CM

## 2014-05-16 LAB — POCT RAPID STREP A: STREPTOCOCCUS, GROUP A SCREEN (DIRECT): NEGATIVE

## 2014-05-16 MED ORDER — IPRATROPIUM BROMIDE 0.06 % NA SOLN: 2.0000 | Freq: Four times a day (QID) | NASAL | Status: DC

## 2014-05-16 NOTE — ED Notes (Signed)
Patient c/o eye irritation and redness, sore throat in the daytime and ear pain x 1 week. Patient reports all her sx come and go. Has been taking Tylenol with mild relief. Patient is in NAD.

## 2014-05-16 NOTE — Discharge Instructions (Signed)
Thank you for coming in today. Call or go to the emergency room if you get worse, have trouble breathing, have chest pains, or palpitations.  Use over-the-counter Zaditor eyedrops (Ketotifen) Use over-the-counter Zyrtec (cetirizine)  Use Systane artificial tears as needed  Use atrovent nasal spray.     Upper Respiratory Infection, Adult An upper respiratory infection (URI) is also sometimes known as the common cold. The upper respiratory tract includes the nose, sinuses, throat, trachea, and bronchi. Bronchi are the airways leading to the lungs. Most people improve within 1 week, but symptoms can last up to 2 weeks. A residual cough may last even longer.  CAUSES Many different viruses can infect the tissues lining the upper respiratory tract. The tissues become irritated and inflamed and often become very moist. Mucus production is also common. A cold is contagious. You can easily spread the virus to others by oral contact. This includes kissing, sharing a glass, coughing, or sneezing. Touching your mouth or nose and then touching a surface, which is then touched by another person, can also spread the virus. SYMPTOMS  Symptoms typically develop 1 to 3 days after you come in contact with a cold virus. Symptoms vary from person to person. They may include:  Runny nose.  Sneezing.  Nasal congestion.  Sinus irritation.  Sore throat.  Loss of voice (laryngitis).  Cough.  Fatigue.  Muscle aches.  Loss of appetite.  Headache.  Low-grade fever. DIAGNOSIS  You might diagnose your own cold based on familiar symptoms, since most people get a cold 2 to 3 times a year. Your caregiver can confirm this based on your exam. Most importantly, your caregiver can check that your symptoms are not due to another disease such as strep throat, sinusitis, pneumonia, asthma, or epiglottitis. Blood tests, throat tests, and X-rays are not necessary to diagnose a common cold, but they may sometimes be  helpful in excluding other more serious diseases. Your caregiver will decide if any further tests are required. RISKS AND COMPLICATIONS  You may be at risk for a more severe case of the common cold if you smoke cigarettes, have chronic heart disease (such as heart failure) or lung disease (such as asthma), or if you have a weakened immune system. The very young and very old are also at risk for more serious infections. Bacterial sinusitis, middle ear infections, and bacterial pneumonia can complicate the common cold. The common cold can worsen asthma and chronic obstructive pulmonary disease (COPD). Sometimes, these complications can require emergency medical care and may be life-threatening. PREVENTION  The best way to protect against getting a cold is to practice good hygiene. Avoid oral or hand contact with people with cold symptoms. Wash your hands often if contact occurs. There is no clear evidence that vitamin C, vitamin E, echinacea, or exercise reduces the chance of developing a cold. However, it is always recommended to get plenty of rest and practice good nutrition. TREATMENT  Treatment is directed at relieving symptoms. There is no cure. Antibiotics are not effective, because the infection is caused by a virus, not by bacteria. Treatment may include:  Increased fluid intake. Sports drinks offer valuable electrolytes, sugars, and fluids.  Breathing heated mist or steam (vaporizer or shower).  Eating chicken soup or other clear broths, and maintaining good nutrition.  Getting plenty of rest.  Using gargles or lozenges for comfort.  Controlling fevers with ibuprofen or acetaminophen as directed by your caregiver.  Increasing usage of your inhaler if you have asthma.  Zinc gel and zinc lozenges, taken in the first 24 hours of the common cold, can shorten the duration and lessen the severity of symptoms. Pain medicines may help with fever, muscle aches, and throat pain. A variety of  non-prescription medicines are available to treat congestion and runny nose. Your caregiver can make recommendations and may suggest nasal or lung inhalers for other symptoms.  HOME CARE INSTRUCTIONS   Only take over-the-counter or prescription medicines for pain, discomfort, or fever as directed by your caregiver.  Use a warm mist humidifier or inhale steam from a shower to increase air moisture. This may keep secretions moist and make it easier to breathe.  Drink enough water and fluids to keep your urine clear or pale yellow.  Rest as needed.  Return to work when your temperature has returned to normal or as your caregiver advises. You may need to stay home longer to avoid infecting others. You can also use a face mask and careful hand washing to prevent spread of the virus. SEEK MEDICAL CARE IF:   After the first few days, you feel you are getting worse rather than better.  You need your caregiver's advice about medicines to control symptoms.  You develop chills, worsening shortness of breath, or brown or red sputum. These may be signs of pneumonia.  You develop yellow or brown nasal discharge or pain in the face, especially when you bend forward. These may be signs of sinusitis.  You develop a fever, swollen neck glands, pain with swallowing, or white areas in the back of your throat. These may be signs of strep throat. SEEK IMMEDIATE MEDICAL CARE IF:   You have a fever.  You develop severe or persistent headache, ear pain, sinus pain, or chest pain.  You develop wheezing, a prolonged cough, cough up blood, or have a change in your usual mucus (if you have chronic lung disease).  You develop sore muscles or a stiff neck. Document Released: 11/12/2000 Document Revised: 08/11/2011 Document Reviewed: 08/24/2013 [facility] Patient Information 2015 [place], [place]. This information is not intended to replace advice given to you by your health care provider. Make sure you discuss any  questions you have with your health care provider.

## 2014-05-16 NOTE — ED Provider Notes (Signed)
[name] is a 58 y.o. female who presents to Urgent Care today for sore throat. Patient has a one-week history of sore throat off and on left ear pain mild bilateral eye injection. No fevers or chills vomiting or diarrhea. No significant cough or shortness of breath. She has tried Tylenol which did not help much.   Past Medical History  Diagnosis Date  . Diabetes mellitus   . Stroke   . Colon cancer    Past Surgical History  Procedure Laterality Date  . Colon surgery    . Esophagogastroduodenoscopy  11/07/2011    Procedure: ESOPHAGOGASTRODUODENOSCOPY (EGD);  Surgeon: Jerene Bears, MD;  Location: [facility];  Service: Gastroenterology;  Laterality: N/A;  . Cholecystectomy     History  Substance Use Topics  . Smoking status: Never Smoker   . Smokeless tobacco: Never Used  . Alcohol Use: No   ROS as above Medications: No current facility-administered medications for this encounter.   Current Outpatient Prescriptions  Medication Sig Dispense Refill  . Alum & Mag Hydroxide-Simeth (MAGIC MOUTHWASH W/LIDOCAINE) SOLN Take 5 mLs by mouth 4 (four) times daily as needed for mouth pain. Use for sore throat 100 mL 0  . aspirin EC 81 MG tablet Take 81 mg by mouth daily.    . benzonatate (TESSALON) 100 MG capsule Take 1 capsule (100 mg total) by mouth every 8 (eight) hours. Take for cough 21 capsule 0  . ferrous sulfate 325 (65 FE) MG tablet Take 325 mg by mouth 2 (two) times daily.    . fluconazole (DIFLUCAN) 150 MG tablet Take 1 tablet (150 mg total) by mouth once. Repeat in 1 week if needed. 1 tablet 1  . fluticasone (FLONASE) 50 MCG/ACT nasal spray Place 2 sprays into both nostrils daily. 16 g 0  . insulin aspart (NOVOLOG) 100 UNIT/ML injection Inject 20-30 Units into the skin 2 (two) times daily. Takes 20 units every night and 30units every morning    . ipratropium (ATROVENT) 0.06 % nasal spray Place 2 sprays into both nostrils 4 (four) times daily. 15 mL 1  . LOVASTATIN PO  Take 1 tablet by mouth daily. Patient does not know strength    . metFORMIN (GLUCOPHAGE) 1000 MG tablet Take 1,000 mg by mouth 2 (two) times daily with a meal.     Allergies  Allergen Reactions  . Glipizide Nausea And Vomiting    REACTION: nausea and vomiting  . Ibuprofen Other (See Comments)    REACTION: tongue swells and dyspnea  . Iohexol      Code: HIVES, Desc: pt called day after IV contrast c/o red, swollen itching feet and red face that started 4hrs after ct, Onset Date: [id number]   . Rosiglitazone-Metformin     REACTION: rash--tolerates Metformin alone  . Hydrocodone-Acetaminophen Nausea And Vomiting and Rash    REACTION: vomiting and rash  . Morphine Rash    REACTION: rash  . Pantoprazole Palpitations  . Sulfamethoxazole Rash    REACTION: rash     Exam:  BP 116/82 mmHg  Pulse 81  Temp(Src) 98.4 F (36.9 C) (Oral)  Resp 16  SpO2 96% Gen: Well NAD HEENT: EOMI,  MMM minimal conjunctival injection bilaterally. Posterior pharynx with cobblestoning. Normal tympanic membranes bilaterally. Left is partially occluded by cerumen. Lungs: Normal work of breathing. CTABL Heart: RRR no MRG Abd: NABS, Soft. Nondistended, Nontender Exts: Brisk capillary refill, warm and well perfused.   The cerumen was irrigated from the left ear, and patient felt  better.  Results for orders placed or performed during the hospital encounter of 05/16/14 (from the past 24 hour(s))  POCT rapid strep A [facility])     Status: None   Collection Time: 05/16/14  3:12 PM  Result Value Ref Range   Streptococcus, Group A Screen (Direct) NEGATIVE NEGATIVE   No results found.  Assessment and Plan: 58 y.o. female with  1) viral URI. Treatment with Atrovent nasal spray and systane artificial tears as needed. Follow-up as needed.  Discussed warning signs or symptoms. Please see discharge instructions. Patient expresses understanding.     Gregor Hams, MD 05/16/14 [phone]

## 2014-05-18 LAB — CULTURE, GROUP A STREP

## 2014-06-16 ENCOUNTER — Encounter: Payer: Self-pay | Admitting: Internal Medicine

## 2014-08-10 ENCOUNTER — Encounter (HOSPITAL_COMMUNITY): Payer: Self-pay

## 2014-08-10 ENCOUNTER — Emergency Department (HOSPITAL_COMMUNITY)
Admission: EM | Admit: 2014-08-10 | Discharge: 2014-08-10 | Disposition: A | Discharge status: Home / Self Care | Payer: Medicare Other | Attending: Emergency Medicine | Admitting: Emergency Medicine

## 2014-08-10 ENCOUNTER — Emergency Department (HOSPITAL_COMMUNITY): Payer: Medicare Other

## 2014-08-10 DIAGNOSIS — R071 Chest pain on breathing: Secondary | ICD-10-CM | POA: Insufficient documentation

## 2014-08-10 DIAGNOSIS — R05 Cough: Secondary | ICD-10-CM

## 2014-08-10 DIAGNOSIS — E119 Type 2 diabetes mellitus without complications: Secondary | ICD-10-CM | POA: Insufficient documentation

## 2014-08-10 DIAGNOSIS — R0789 Other chest pain: Secondary | ICD-10-CM

## 2014-08-10 DIAGNOSIS — B9789 Other viral agents as the cause of diseases classified elsewhere: Secondary | ICD-10-CM

## 2014-08-10 DIAGNOSIS — R110 Nausea: Secondary | ICD-10-CM | POA: Insufficient documentation

## 2014-08-10 DIAGNOSIS — Z79899 Other long term (current) drug therapy: Secondary | ICD-10-CM | POA: Insufficient documentation

## 2014-08-10 DIAGNOSIS — R059 Cough, unspecified: Secondary | ICD-10-CM

## 2014-08-10 DIAGNOSIS — J069 Acute upper respiratory infection, unspecified: Secondary | ICD-10-CM | POA: Insufficient documentation

## 2014-08-10 DIAGNOSIS — Z85038 Personal history of other malignant neoplasm of large intestine: Secondary | ICD-10-CM | POA: Insufficient documentation

## 2014-08-10 DIAGNOSIS — Z794 Long term (current) use of insulin: Secondary | ICD-10-CM | POA: Insufficient documentation

## 2014-08-10 DIAGNOSIS — Z8673 Personal history of transient ischemic attack (TIA), and cerebral infarction without residual deficits: Secondary | ICD-10-CM | POA: Insufficient documentation

## 2014-08-10 DIAGNOSIS — Z7982 Long term (current) use of aspirin: Secondary | ICD-10-CM | POA: Insufficient documentation

## 2014-08-10 LAB — CBG MONITORING, ED: Glucose-Capillary: 206 mg/dL — ABNORMAL HIGH (ref 70–99)

## 2014-08-10 MED ORDER — ALBUTEROL SULFATE HFA 108 (90 BASE) MCG/ACT IN AERS
2.0000 | INHALATION_SPRAY | Freq: Once | RESPIRATORY_TRACT | Status: AC
  Administered 2014-08-10: 2 via RESPIRATORY_TRACT
  Filled 2014-08-10: qty 6.7

## 2014-08-10 MED ORDER — ACETAMINOPHEN 500 MG PO TABS: 500.0000 mg | ORAL_TABLET | Freq: Four times a day (QID) | ORAL | Status: DC | PRN

## 2014-08-10 MED ORDER — OXYCODONE-ACETAMINOPHEN 5-325 MG PO TABS
1.0000 | ORAL_TABLET | Freq: Once | ORAL | Status: AC
  Administered 2014-08-10: 1 via ORAL
  Filled 2014-08-10: qty 1

## 2014-08-10 MED ORDER — BENZONATATE 100 MG PO CAPS: 100.0000 mg | ORAL_CAPSULE | Freq: Three times a day (TID) | ORAL | Status: DC

## 2014-08-10 MED ORDER — AZITHROMYCIN 250 MG PO TABS: 250.0000 mg | ORAL_TABLET | Freq: Every day | ORAL | Status: DC

## 2014-08-10 NOTE — Discharge Instructions (Signed)
Upper Respiratory Infection, Adult An upper respiratory infection (URI) is also sometimes known as the common cold. The upper respiratory tract includes the nose, sinuses, throat, trachea, and bronchi. Bronchi are the airways leading to the lungs. Most people improve within 1 week, but symptoms can last up to 2 weeks. A residual cough may last even longer.  CAUSES Many different viruses can infect the tissues lining the upper respiratory tract. The tissues become irritated and inflamed and often become very moist. Mucus production is also common. A cold is contagious. You can easily spread the virus to others by oral contact. This includes kissing, sharing a glass, coughing, or sneezing. Touching your mouth or nose and then touching a surface, which is then touched by another person, can also spread the virus. SYMPTOMS  Symptoms typically develop 1 to 3 days after you come in contact with a cold virus. Symptoms vary from person to person. They may include:  Runny nose.  Sneezing.  Nasal congestion.  Sinus irritation.  Sore throat.  Loss of voice (laryngitis).  Cough.  Fatigue.  Muscle aches.  Loss of appetite.  Headache.  Low-grade fever. DIAGNOSIS  You might diagnose your own cold based on familiar symptoms, since most people get a cold 2 to 3 times a year. Your caregiver can confirm this based on your exam. Most importantly, your caregiver can check that your symptoms are not due to another disease such as strep throat, sinusitis, pneumonia, asthma, or epiglottitis. Blood tests, throat tests, and X-rays are not necessary to diagnose a common cold, but they may sometimes be helpful in excluding other more serious diseases. Your caregiver will decide if any further tests are required. RISKS AND COMPLICATIONS  You may be at risk for a more severe case of the common cold if you smoke cigarettes, have chronic heart disease (such as heart failure) or lung disease (such as asthma), or if  you have a weakened immune system. The very young and very old are also at risk for more serious infections. Bacterial sinusitis, middle ear infections, and bacterial pneumonia can complicate the common cold. The common cold can worsen asthma and chronic obstructive pulmonary disease (COPD). Sometimes, these complications can require emergency medical care and may be life-threatening. PREVENTION  The best way to protect against getting a cold is to practice good hygiene. Avoid oral or hand contact with people with cold symptoms. Wash your hands often if contact occurs. There is no clear evidence that vitamin C, vitamin E, echinacea, or exercise reduces the chance of developing a cold. However, it is always recommended to get plenty of rest and practice good nutrition. TREATMENT  Treatment is directed at relieving symptoms. There is no cure. Antibiotics are not effective, because the infection is caused by a virus, not by bacteria. Treatment may include:  Increased fluid intake. Sports drinks offer valuable electrolytes, sugars, and fluids.  Breathing heated mist or steam (vaporizer or shower).  Eating chicken soup or other clear broths, and maintaining good nutrition.  Getting plenty of rest.  Using gargles or lozenges for comfort.  Controlling fevers with ibuprofen or acetaminophen as directed by your caregiver.  Increasing usage of your inhaler if you have asthma. Zinc gel and zinc lozenges, taken in the first 24 hours of the common cold, can shorten the duration and lessen the severity of symptoms. Pain medicines may help with fever, muscle aches, and throat pain. A variety of non-prescription medicines are available to treat congestion and runny nose. Your caregiver  can make recommendations and may suggest nasal or lung inhalers for other symptoms.  HOME CARE INSTRUCTIONS   Only take over-the-counter or prescription medicines for pain, discomfort, or fever as directed by your  caregiver.  Use a warm mist humidifier or inhale steam from a shower to increase air moisture. This may keep secretions moist and make it easier to breathe.  Drink enough water and fluids to keep your urine clear or pale yellow.  Rest as needed.  Return to work when your temperature has returned to normal or as your caregiver advises. You may need to stay home longer to avoid infecting others. You can also use a face mask and careful hand washing to prevent spread of the virus. SEEK MEDICAL CARE IF:   After the first few days, you feel you are getting worse rather than better.  You need your caregiver's advice about medicines to control symptoms.  You develop chills, worsening shortness of breath, or brown or red sputum. These may be signs of pneumonia.  You develop yellow or brown nasal discharge or pain in the face, especially when you bend forward. These may be signs of sinusitis.  You develop a fever, swollen neck glands, pain with swallowing, or white areas in the back of your throat. These may be signs of strep throat. SEEK IMMEDIATE MEDICAL CARE IF:   You have a fever.  You develop severe or persistent headache, ear pain, sinus pain, or chest pain.  You develop wheezing, a prolonged cough, cough up blood, or have a change in your usual mucus (if you have chronic lung disease).  You develop sore muscles or a stiff neck. Document Released: 11/12/2000 Document Revised: 08/11/2011 Document Reviewed: 08/24/2013 [facility] Patient Information 2015 [place], [place]. This information is not intended to replace advice given to you by your health care provider. Make sure you discuss any questions you have with your health care provider.  Cough, Adult  A cough is a reflex that helps clear your throat and airways. It can help heal the body or may be a reaction to an irritated airway. A cough may only last 2 or 3 weeks (acute) or may last more than 8 weeks (chronic).  CAUSES Acute  cough:  Viral or bacterial infections. Chronic cough:  Infections.  Allergies.  Asthma.  Post-nasal drip.  Smoking.  Heartburn or acid reflux.  Some medicines.  Chronic lung problems (COPD).  Cancer. SYMPTOMS   Cough.  Fever.  Chest pain.  Increased breathing rate.  High-pitched whistling sound when breathing (wheezing).  Colored mucus that you cough up (sputum). TREATMENT   A bacterial cough may be treated with antibiotic medicine.  A viral cough must run its course and will not respond to antibiotics.  Your caregiver may recommend other treatments if you have a chronic cough. HOME CARE INSTRUCTIONS   Only take over-the-counter or prescription medicines for pain, discomfort, or fever as directed by your caregiver. Use cough suppressants only as directed by your caregiver.  Use a cold steam vaporizer or humidifier in your bedroom or home to help loosen secretions.  Sleep in a semi-upright position if your cough is worse at night.  Rest as needed.  Stop smoking if you smoke. SEEK IMMEDIATE MEDICAL CARE IF:   You have pus in your sputum.  Your cough starts to worsen.  You cannot control your cough with suppressants and are losing sleep.  You begin coughing up blood.  You have difficulty breathing.  You develop pain which is  getting worse or is uncontrolled with medicine.  You have a fever. MAKE SURE YOU:   Understand these instructions.  Will watch your condition.  Will get help right away if you are not doing well or get worse. Document Released: 11/15/2010 Document Revised: 08/11/2011 Document Reviewed: 11/15/2010 [facility] Patient Information 2015 [place], [place]. This information is not intended to replace advice given to you by your health care provider. Make sure you discuss any questions you have with your health care provider.  Costochondritis Costochondritis, sometimes called Tietze syndrome, is a swelling and irritation  (inflammation) of the tissue (cartilage) that connects your ribs with your breastbone (sternum). It causes pain in the chest and rib area. Costochondritis usually goes away on its own over time. It can take up to 6 weeks or longer to get better, especially if you are unable to limit your activities. CAUSES  Some cases of costochondritis have no known cause. Possible causes include:  Injury (trauma).  Exercise or activity such as lifting.  Severe coughing. SIGNS AND SYMPTOMS  Pain and tenderness in the chest and rib area.  Pain that gets worse when coughing or taking deep breaths.  Pain that gets worse with specific movements. DIAGNOSIS  Your health care provider will do a physical exam and ask about your symptoms. Chest X-rays or other tests may be done to rule out other problems. TREATMENT  Costochondritis usually goes away on its own over time. Your health care provider may prescribe medicine to help relieve pain. HOME CARE INSTRUCTIONS   Avoid exhausting physical activity. Try not to strain your ribs during normal activity. This would include any activities using chest, abdominal, and side muscles, especially if heavy weights are used.  Apply ice to the affected area for the first 2 days after the pain begins.  Put ice in a plastic bag.  Place a towel between your skin and the bag.  Leave the ice on for 20 minutes, 2-3 times a day.  Only take over-the-counter or prescription medicines as directed by your health care provider. SEEK MEDICAL CARE IF:  You have redness or swelling at the rib joints. These are signs of infection.  Your pain does not go away despite rest or medicine. SEEK IMMEDIATE MEDICAL CARE IF:   Your pain increases or you are very uncomfortable.  You have shortness of breath or difficulty breathing.  You cough up blood.  You have worse chest pains, sweating, or vomiting.  You have a fever or persistent symptoms for more than 2-3 days.  You have a  fever and your symptoms suddenly get worse. MAKE SURE YOU:   Understand these instructions.  Will watch your condition.  Will get help right away if you are not doing well or get worse. Document Released: 02/26/2005 Document Revised: 03/09/2013 Document Reviewed: 12/21/2012 [facility] Patient Information 2015 [place], [place]. This information is not intended to replace advice given to you by your health care provider. Make sure you discuss any questions you have with your health care provider.

## 2014-08-10 NOTE — ED Notes (Signed)
Ambulated pt with pulse ox:  94%, HR-98

## 2014-08-10 NOTE — ED Notes (Signed)
Pt verbalizes  Understanding on how to use albuterol inhaler

## 2014-08-10 NOTE — ED Notes (Signed)
Patient transported to X-ray 

## 2014-08-10 NOTE — ED Notes (Signed)
Pt complains of a dry cough and sinus congestion since Monday

## 2014-08-10 NOTE — ED Provider Notes (Signed)
CSN: [id number]     Arrival date & time 08/10/14  0004 History   First MD Initiated Contact with Patient 08/10/14 0109     Chief Complaint  Patient presents with  . Cough    (Consider location/radiation/quality/duration/timing/severity/associated sxs/prior Treatment) HPI Comments: 59 year old female with a history of diabetes mellitus presents to the emergency department for further evaluation of chest pain. Patient states that chest pain has been worsening over the past 2 days, is worse with deep breathing and coughing, and associated with nasal congestion, sinus pressure, rhinorrhea, and cough. Cough is Congested and nonproductive. Patient denies any hemoptysis. She has been taking Tylenol for her associated body aches with mild improvement. Patient reports that her grandson has been sick with upper respiratory symptoms and cough. She endorses a subjective fever and chills.  Patient is a 59 y.o. female presenting with cough. The history is provided by the patient. No language interpreter was used.  Cough Cough characteristics: congested. Severity:  Moderate Onset quality:  Gradual Duration:  3 days Timing:  Intermittent Progression:  Waxing and waning Chronicity:  New Smoker: no   Context: sick contacts (grandson sick with URI and cough)   Relieved by: Tylenol, mildly. Worsened by:  Deep breathing Associated symptoms: chest pain, chills, fever (subjective), rhinorrhea and sinus congestion   Associated symptoms: no ear pain and no wheezing     Past Medical History  Diagnosis Date  . Diabetes mellitus   . Stroke   . Colon cancer    Past Surgical History  Procedure Laterality Date  . Colon surgery    . Esophagogastroduodenoscopy  11/07/2011    Procedure: ESOPHAGOGASTRODUODENOSCOPY (EGD);  Surgeon: Jerene Bears, MD;  Location: [facility];  Service: Gastroenterology;  Laterality: N/A;  . Cholecystectomy     Family History  Problem Relation Age of Onset  . Cancer Other   .  Diabetes Other    History  Substance Use Topics  . Smoking status: Never Smoker   . Smokeless tobacco: Never Used  . Alcohol Use: No   OB History    No data available      Review of Systems  Constitutional: Positive for fever (subjective) and chills.  HENT: Positive for congestion, rhinorrhea, sinus pressure and sneezing. Negative for ear pain.   Respiratory: Positive for cough. Negative for wheezing.   Cardiovascular: Positive for chest pain.  Gastrointestinal: Positive for nausea. Negative for vomiting, abdominal pain and diarrhea.  Neurological: Negative for syncope.  All other systems reviewed and are negative.   Allergies  Glipizide; Ibuprofen; Iohexol; Rosiglitazone-metformin; Hydrocodone-acetaminophen; Morphine; Pantoprazole; and Sulfamethoxazole  Home Medications   Prior to Admission medications   Medication Sig Start Date End Date Taking? Authorizing Provider  acetaminophen (TYLENOL) 500 MG tablet Take 500 mg by mouth every 6 (six) hours as needed for mild pain.   Yes Historical Provider, MD  aspirin EC 81 MG tablet Take 81 mg by mouth daily.   Yes Historical Provider, MD  Calcium-Magnesium-Vitamin D (CALCIUM 500 PO) Take 1 tablet by mouth daily.   Yes Historical Provider, MD  cholecalciferol (VITAMIN D) 1000 UNITS tablet Take 1,000 Units by mouth daily.   Yes Historical Provider, MD  ferrous sulfate 325 (65 FE) MG tablet Take 325 mg by mouth daily with breakfast.    Yes Historical Provider, MD  insulin aspart (NOVOLOG) 100 UNIT/ML injection Inject 20-30 Units into the skin 2 (two) times daily. Takes 20 units every night and 30units every morning   Yes Historical Provider, MD  metFORMIN (GLUCOPHAGE) 1000 MG tablet Take 1,000 mg by mouth 2 (two) times daily with a meal.   Yes Historical Provider, MD  Alum & Mag Hydroxide-Simeth (MAGIC MOUTHWASH W/LIDOCAINE) SOLN Take 5 mLs by mouth 4 (four) times daily as needed for mouth pain. Use for sore throat Patient not taking:  Reported on 08/10/2014 08/30/13   Antonietta Breach, PA-C  benzonatate (TESSALON) 100 MG capsule Take 1 capsule (100 mg total) by mouth every 8 (eight) hours. Take for cough Patient not taking: Reported on 08/10/2014 08/30/13   Antonietta Breach, PA-C  fluconazole (DIFLUCAN) 150 MG tablet Take 1 tablet (150 mg total) by mouth once. Repeat in 1 week if needed. Patient not taking: Reported on 08/10/2014 09/30/13   Billy Fischer, MD  fluticasone [facility]) 50 MCG/ACT nasal spray Place 2 sprays into both nostrils daily. Patient not taking: Reported on 08/10/2014 08/30/13   Antonietta Breach, PA-C  ipratropium (ATROVENT) 0.06 % nasal spray Place 2 sprays into both nostrils 4 (four) times daily. Patient not taking: Reported on 08/10/2014 05/16/14   Gregor Hams, MD   BP 143/73 mmHg  Pulse 92  Temp(Src) 98.1 F (36.7 C) (Oral)  Resp 20  SpO2 94%   Physical Exam  Constitutional: She is oriented to person, place, and time. She appears well-developed and well-nourished. No distress.  Nontoxic/nonseptic appearing  HENT:  Head: Normocephalic and atraumatic.  Mouth/Throat: Oropharynx is clear and moist. No oropharyngeal exudate.  Eyes: Conjunctivae and EOM are normal. No scleral icterus.  Neck: Normal range of motion.  Cardiovascular: Normal rate, regular rhythm and normal heart sounds.   Pulmonary/Chest: Effort normal and breath sounds normal. No respiratory distress. She has no wheezes. She has no rales. She exhibits tenderness. She exhibits no crepitus, no edema and no deformity.    Respirations even and unlabored. Lungs clear.  Abdominal: Soft. She exhibits no distension. There is no tenderness. There is no rebound.  Soft, nontender  Musculoskeletal: Normal range of motion.  Neurological: She is alert and oriented to person, place, and time. She exhibits normal muscle tone. Coordination normal.  Skin: Skin is warm and dry. No rash noted. She is not diaphoretic. No erythema. No pallor.  Psychiatric: She has a normal  mood and affect. Her behavior is normal.  Nursing note and vitals reviewed.   ED Course  Procedures (including critical care time) Labs Review Labs Reviewed  CBG MONITORING, ED    Imaging Review Dg Chest 2 View  08/10/2014   CLINICAL DATA:  Cough and sinus congestion for 2 days. Intermittent fever.  EXAM: CHEST  2 VIEW  COMPARISON:  05/17/2012  FINDINGS: Tip of the left chest port in the region of the distal brachiocephalic vein. Cardiomediastinal contours are normal. Scarring in the right upper lobe is unchanged from prior. No consolidation, pleural effusion, pneumothorax or pulmonary edema. No acute osseous abnormalities are seen.  IMPRESSION: No acute pulmonary process. Stable scarring in the right upper lobe.   Electronically Signed   By: Jeb Levering M.D.   On: 08/10/2014 01:22     EKG Interpretation None      MDM   Final diagnoses:  Viral URI with cough  Costochondral chest pain    Pt CXR negative for acute infiltrate. Patients symptoms are consistent with URI, likely viral etiology but will start on Z-pack given hx of DM. She ambulates in the ED without hypoxia. Pt will be discharged with symptomatic treatment. Patient verbalizes understanding and is agreeable with plan. Pt is hemodynamically  stable and in NAD prior to discharge. Patient discharged in good condition.   Filed Vitals:   08/10/14 0011 08/10/14 0205  BP: 143/73 113/62  Pulse: 92 75  Temp: 98.1 F (36.7 C) 97.9 F (36.6 C)  TempSrc: Oral Oral  Resp: 20 18  SpO2: 94% 93%       Antonietta Breach, PA-C 08/10/14 0160  April Palumbo, MD 08/10/14 [phone]

## 2014-08-13 MED ORDER — BENZONATATE 100 MG PO CAPS: 100.0000 mg | ORAL_CAPSULE | Freq: Three times a day (TID) | ORAL | Status: DC

## 2014-08-13 MED ORDER — AZITHROMYCIN 250 MG PO TABS: 250.0000 mg | ORAL_TABLET | Freq: Every day | ORAL | Status: DC

## 2014-08-13 MED ORDER — ACETAMINOPHEN 500 MG PO TABS: 500.0000 mg | ORAL_TABLET | Freq: Four times a day (QID) | ORAL | Status: AC | PRN

## 2014-08-13 NOTE — ED Provider Notes (Signed)
Pt's son report they did not received their prescribed medications from last night.  Pt was prescribed tessalon, zpak, and tylenol.  Will represcribed meds  Domenic Moras, PA-C 08/13/14 1635

## 2014-08-16 ENCOUNTER — Encounter (HOSPITAL_BASED_OUTPATIENT_CLINIC_OR_DEPARTMENT_OTHER): Payer: Self-pay

## 2014-08-16 ENCOUNTER — Emergency Department (HOSPITAL_BASED_OUTPATIENT_CLINIC_OR_DEPARTMENT_OTHER)
Admission: EM | Admit: 2014-08-16 | Discharge: 2014-08-16 | Disposition: A | Discharge status: Home / Self Care | Payer: Medicare Other | Attending: Emergency Medicine | Admitting: Emergency Medicine

## 2014-08-16 DIAGNOSIS — R05 Cough: Secondary | ICD-10-CM | POA: Insufficient documentation

## 2014-08-16 DIAGNOSIS — Z85038 Personal history of other malignant neoplasm of large intestine: Secondary | ICD-10-CM | POA: Insufficient documentation

## 2014-08-16 DIAGNOSIS — R062 Wheezing: Secondary | ICD-10-CM | POA: Insufficient documentation

## 2014-08-16 DIAGNOSIS — R0602 Shortness of breath: Secondary | ICD-10-CM | POA: Insufficient documentation

## 2014-08-16 DIAGNOSIS — Z8673 Personal history of transient ischemic attack (TIA), and cerebral infarction without residual deficits: Secondary | ICD-10-CM | POA: Insufficient documentation

## 2014-08-16 DIAGNOSIS — L234 Allergic contact dermatitis due to dyes: Secondary | ICD-10-CM | POA: Insufficient documentation

## 2014-08-16 DIAGNOSIS — T782XXA Anaphylactic shock, unspecified, initial encounter: Secondary | ICD-10-CM

## 2014-08-16 DIAGNOSIS — R000 Tachycardia, unspecified: Secondary | ICD-10-CM | POA: Insufficient documentation

## 2014-08-16 DIAGNOSIS — Z7982 Long term (current) use of aspirin: Secondary | ICD-10-CM | POA: Insufficient documentation

## 2014-08-16 DIAGNOSIS — Z792 Long term (current) use of antibiotics: Secondary | ICD-10-CM | POA: Insufficient documentation

## 2014-08-16 DIAGNOSIS — K1379 Other lesions of oral mucosa: Secondary | ICD-10-CM | POA: Insufficient documentation

## 2014-08-16 DIAGNOSIS — R0902 Hypoxemia: Secondary | ICD-10-CM | POA: Insufficient documentation

## 2014-08-16 DIAGNOSIS — Z79899 Other long term (current) drug therapy: Secondary | ICD-10-CM | POA: Insufficient documentation

## 2014-08-16 DIAGNOSIS — R21 Rash and other nonspecific skin eruption: Secondary | ICD-10-CM | POA: Insufficient documentation

## 2014-08-16 DIAGNOSIS — Z794 Long term (current) use of insulin: Secondary | ICD-10-CM | POA: Insufficient documentation

## 2014-08-16 DIAGNOSIS — E119 Type 2 diabetes mellitus without complications: Secondary | ICD-10-CM | POA: Insufficient documentation

## 2014-08-16 LAB — CBC
HEMATOCRIT: 44.4 % (ref 36.0–46.0)
Hemoglobin: 14.9 g/dL (ref 12.0–15.0)
MCH: 26.1 pg (ref 26.0–34.0)
MCHC: 33.6 g/dL (ref 30.0–36.0)
MCV: 77.9 fL — ABNORMAL LOW (ref 78.0–100.0)
PLATELETS: 278 10*3/uL (ref 150–400)
RBC: 5.7 MIL/uL — ABNORMAL HIGH (ref 3.87–5.11)
RDW: 13.9 % (ref 11.5–15.5)
WBC: 6.3 10*3/uL (ref 4.0–10.5)

## 2014-08-16 LAB — BASIC METABOLIC PANEL
ANION GAP: 11 (ref 5–15)
BUN: 21 mg/dL (ref 6–23)
CALCIUM: 8.8 mg/dL (ref 8.4–10.5)
CO2: 24 mmol/L (ref 19–32)
CREATININE: 1.24 mg/dL — AB (ref 0.50–1.10)
Chloride: 105 mmol/L (ref 96–112)
GFR calc Af Amer: 55 mL/min — ABNORMAL LOW (ref >90)
GFR calc non Af Amer: 47 mL/min — ABNORMAL LOW (ref >90)
Glucose, Bld: 333 mg/dL — ABNORMAL HIGH (ref 70–99)
Potassium: 3.8 mmol/L (ref 3.5–5.1)
Sodium: 140 mmol/L (ref 135–145)

## 2014-08-16 MED ORDER — FAMOTIDINE IN NACL 20-0.9 MG/50ML-% IV SOLN
20.0000 mg | Freq: Once | INTRAVENOUS | Status: AC
  Administered 2014-08-16: 20 mg via INTRAVENOUS
  Filled 2014-08-16: qty 50

## 2014-08-16 MED ORDER — METHYLPREDNISOLONE SODIUM SUCC 125 MG IJ SOLR
125.0000 mg | Freq: Once | INTRAMUSCULAR | Status: AC
  Administered 2014-08-16: 125 mg via INTRAVENOUS
  Filled 2014-08-16: qty 2

## 2014-08-16 MED ORDER — SODIUM CHLORIDE 0.9 % IV BOLUS (SEPSIS)
1000.0000 mL | Freq: Once | INTRAVENOUS | Status: AC
  Administered 2014-08-16: 1000 mL via INTRAVENOUS

## 2014-08-16 MED ORDER — EPINEPHRINE 0.3 MG/0.3ML IJ SOAJ
0.3000 mg | Freq: Once | INTRAMUSCULAR | Status: AC
  Administered 2014-08-16: 0.3 mg via INTRAMUSCULAR
  Filled 2014-08-16: qty 0.6

## 2014-08-16 MED ORDER — EPINEPHRINE 0.3 MG/0.3ML IJ SOAJ: 0.3000 mg | INTRAMUSCULAR | Status: AC | PRN

## 2014-08-16 MED ORDER — PREDNISONE 50 MG PO TABS
60.0000 mg | ORAL_TABLET | Freq: Once | ORAL | Status: AC
  Administered 2014-08-16: 60 mg via ORAL
  Filled 2014-08-16 (×2): qty 1

## 2014-08-16 MED ORDER — FAMOTIDINE 20 MG PO TABS: 20.0000 mg | ORAL_TABLET | Freq: Two times a day (BID) | ORAL | Status: DC

## 2014-08-16 MED ORDER — DIPHENHYDRAMINE HCL 25 MG PO TABS: 50.0000 mg | ORAL_TABLET | ORAL | Status: DC | PRN

## 2014-08-16 MED ORDER — DIPHENHYDRAMINE HCL 50 MG/ML IJ SOLN
25.0000 mg | Freq: Once | INTRAMUSCULAR | Status: AC
  Administered 2014-08-16: 25 mg via INTRAVENOUS
  Filled 2014-08-16: qty 1

## 2014-08-16 MED ORDER — EPINEPHRINE HCL 1 MG/ML IJ SOLN
INTRAMUSCULAR | Status: AC
Start: 2014-08-16 — End: 2014-08-16
  Filled 2014-08-16: qty 1

## 2014-08-16 MED ORDER — RANITIDINE HCL 15 MG/ML PO SYRP: 75.0000 mg | ORAL_SOLUTION | Freq: Once | ORAL | Status: DC

## 2014-08-16 MED ORDER — PREDNISONE 10 MG PO TABS: 40.0000 mg | ORAL_TABLET | Freq: Every day | ORAL | Status: DC

## 2014-08-16 NOTE — ED Notes (Signed)
Placed on cardiac, pulse ox and bp monitoring.

## 2014-08-16 NOTE — ED Provider Notes (Addendum)
CSN: [id number]     Arrival date & time 08/16/14  1708 History   First MD Initiated Contact with Patient 08/16/14 1714     Chief Complaint  Patient presents with  . Allergic Reaction     (Consider location/radiation/quality/duration/timing/severity/associated sxs/prior Treatment) Patient is a 59 y.o. female presenting with allergic reaction. The history is provided by the patient.  Allergic Reaction Presenting symptoms: difficulty swallowing and wheezing   Difficulty swallowing:    Severity:  Mild   Onset quality:  Sudden   Duration: 45 minutes.   Progression:  Worsening Severity:  Severe Prior allergic episodes:  Allergies to medications Context: cosmetics   Relieved by:  Nothing Worsened by:  Nothing tried   Past Medical History  Diagnosis Date  . Diabetes mellitus   . Stroke   . Colon cancer    Past Surgical History  Procedure Laterality Date  . Colon surgery    . Esophagogastroduodenoscopy  11/07/2011    Procedure: ESOPHAGOGASTRODUODENOSCOPY (EGD);  Surgeon: Jerene Bears, MD;  [facility];  Service: Gastroenterology;  Laterality: N/A;  . Cholecystectomy     Family History  Problem Relation Age of Onset  . Cancer Other   . Diabetes Other    History  Substance Use Topics  . Smoking status: Never Smoker   . Smokeless tobacco: Never Used  . Alcohol Use: No   OB History    No data available     Review of Systems  Constitutional: Negative for fever.  HENT: Positive for trouble swallowing. Negative for tinnitus.   Respiratory: Positive for cough, shortness of breath and wheezing.   All other systems reviewed and are negative.     Allergies  Glipizide; Ibuprofen; Iohexol; Rosiglitazone-metformin; Hydrocodone-acetaminophen; Morphine; Pantoprazole; and Sulfamethoxazole  Home Medications   Prior to Admission medications   Medication Sig Start Date End Date Taking? Authorizing Provider  acetaminophen (TYLENOL) 500 MG tablet Take 1 tablet (500 mg  total) by mouth every 6 (six) hours as needed. 08/13/14   Domenic Moras, PA-C  Alum & Mag Hydroxide-Simeth (MAGIC MOUTHWASH W/LIDOCAINE) SOLN Take 5 mLs by mouth 4 (four) times daily as needed for mouth pain. Use for sore throat Patient not taking: Reported on 08/10/2014 08/30/13   Antonietta Breach, PA-C  aspirin EC 81 MG tablet Take 81 mg by mouth daily.    Historical Provider, MD  azithromycin (ZITHROMAX) 250 MG tablet Take 1 tablet (250 mg total) by mouth daily. Take first 2 tablets together, then 1 every day until finished. 08/13/14   Domenic Moras, PA-C  benzonatate (TESSALON) 100 MG capsule Take 1 capsule (100 mg total) by mouth every 8 (eight) hours. 08/13/14   Domenic Moras, PA-C  Calcium-Magnesium-Vitamin D (CALCIUM 500 PO) Take 1 tablet by mouth daily.    Historical Provider, MD  cholecalciferol (VITAMIN D) 1000 UNITS tablet Take 1,000 Units by mouth daily.    Historical Provider, MD  ferrous sulfate 325 (65 FE) MG tablet Take 325 mg by mouth daily with breakfast.     Historical Provider, MD  fluconazole (DIFLUCAN) 150 MG tablet Take 1 tablet (150 mg total) by mouth once. Repeat in 1 week if needed. Patient not taking: Reported on 08/10/2014 09/30/13   Billy Fischer, MD  fluticasone [employer]) 50 MCG/ACT nasal spray Place 2 sprays into both nostrils daily. Patient not taking: Reported on 08/10/2014 08/30/13   Antonietta Breach, PA-C  insulin aspart (NOVOLOG) 100 UNIT/ML injection Inject 20-30 Units into the skin 2 (two) times daily. Takes 20  units every night and 30units every morning    Historical Provider, MD  ipratropium (ATROVENT) 0.06 % nasal spray Place 2 sprays into both nostrils 4 (four) times daily. Patient not taking: Reported on 08/10/2014 05/16/14   Gregor Hams, MD  metFORMIN (GLUCOPHAGE) 1000 MG tablet Take 1,000 mg by mouth 2 (two) times daily with a meal.    Historical Provider, MD   BP 82/55 mmHg  Pulse 123  Temp(Src) 97.7 F (36.5 C) (Oral)  Resp 22  Ht 5\' 3"  (1.6 m)  Wt 165 lb (74.844 kg)  BMI  29.24 kg/m2  SpO2 96% Physical Exam  Constitutional: She is oriented to person, place, and time. She appears well-developed and well-nourished. No distress.  HENT:  Head: Normocephalic and atraumatic.  Mouth/Throat: Oropharynx is clear and moist. No oropharyngeal exudate, posterior oropharyngeal edema, posterior oropharyngeal erythema or tonsillar abscesses.  Mild tongue edema  Eyes: EOM are normal. Pupils are equal, round, and reactive to light.  Neck: Normal range of motion. Neck supple.  Cardiovascular: Normal rate and regular rhythm.  Exam reveals no friction rub.   No murmur heard. Pulmonary/Chest: Effort normal and breath sounds normal. No respiratory distress. She has no wheezes. She has no rales.  Abdominal: Soft. She exhibits no distension. There is no tenderness. There is no rebound.  Musculoskeletal: Normal range of motion. She exhibits no edema.  Neurological: She is alert and oriented to person, place, and time.  Skin: Rash (diffuse redness) noted. She is not diaphoretic.  Nursing note and vitals reviewed.   ED Course  Procedures (including critical care time) Labs Review Labs Reviewed - No data to display  Imaging Review No results found.   EKG Interpretation None     CRITICAL CARE Performed by: Osvaldo Shipper   Total critical care time: 30 minutes  Critical care time was exclusive of separately billable procedures and treating other patients.  Critical care was necessary to treat or prevent imminent or life-threatening deterioration.  Critical care was time spent personally by me on the following activities: development of treatment plan with patient and/or surrogate as well as nursing, discussions with consultants, evaluation of patient's response to treatment, examination of patient, obtaining history from patient or surrogate, ordering and performing treatments and interventions, ordering and review of laboratory studies, ordering and review of  radiographic studies, pulse oximetry and re-evaluation of patient's condition.  MDM   Final diagnoses:  Anaphylaxis, initial encounter    59 year old female here with concern for anaphylaxis. Used a hair dye which is reviewed for but began having shortness of breath, sensation of tongue swelling, red rash. Here she has a diffuse rash. She has very mild tongue edema. She is hypoxic. Initially hypotensive in triage and tachycardic. After epinephrine, she feels like her tongue is better. Steroids, H2 blocker, fluids given. Will continue to monitor. BP improving after initial Epi dose. No further tongue swelling on multiple checks. Rash resolved. Tongue swelling resolved. Hypoxia resolved.  Given Rx for Epi, steroids, benadryl. Stable for discharge.  Evelina Bucy, MD 08/16/14 2004  Evelina Bucy, MD 08/17/14 [phone]

## 2014-08-16 NOTE — ED Notes (Signed)
Placed on 2 liters nasal cannula.

## 2014-08-16 NOTE — ED Notes (Signed)
Erythema to upper body cleared, tongue remains same with airway patent, pt making more effort to hold tongue out than previous, throat patent.  Denies difficulty breathing or other complaints at this time.  Aware waiting for dispo after obs time following epi injection.

## 2014-08-16 NOTE — Discharge Instructions (Signed)
Anaphylactic Reaction °An anaphylactic reaction is a sudden, severe allergic reaction that involves the whole body. It can be life threatening. A hospital stay is often required. People with asthma, eczema, or hay fever are slightly more likely to have an anaphylactic reaction. °CAUSES  °An anaphylactic reaction may be caused by anything to which you are allergic. After being exposed to the allergic substance, your immune system becomes sensitized to it. When you are exposed to that allergic substance again, an allergic reaction can occur. Common causes of an anaphylactic reaction include: °· Medicines. °· Foods, especially peanuts, wheat, shellfish, milk, and eggs. °· Insect bites or stings. °· Blood products. °· Chemicals, such as dyes, latex, and contrast material used for imaging tests. °SYMPTOMS  °When an allergic reaction occurs, the body releases histamine and other substances. These substances cause symptoms such as tightening of the airway. Symptoms often develop within seconds or minutes of exposure. Symptoms may include: °· Skin rash or hives. °· Itching. °· Chest tightness. °· Swelling of the eyes, tongue, or lips. °· Trouble breathing or swallowing. °· Lightheadedness or fainting. °· Anxiety or confusion. °· Stomach pains, vomiting, or diarrhea. °· Nasal congestion. °· A fast or irregular heartbeat (palpitations). °DIAGNOSIS  °Diagnosis is based on your history of recent exposure to allergic substances, your symptoms, and a physical exam. Your caregiver may also perform blood or urine tests to confirm the diagnosis. °TREATMENT  °Epinephrine medicine is the main treatment for an anaphylactic reaction. Other medicines that may be used for treatment include antihistamines, steroids, and albuterol. In severe cases, fluids and medicine to support blood pressure may be given through an intravenous line (IV). Even if you improve after treatment, you need to be observed to make sure your condition does not get  worse. This may require a stay in the hospital. °HOME CARE INSTRUCTIONS  °· Wear a medical alert bracelet or necklace stating your allergy. °· You and your family must learn how to use an anaphylaxis kit or give an epinephrine injection to temporarily treat an emergency allergic reaction. Always carry your epinephrine injection or anaphylaxis kit with you. This can be lifesaving if you have a severe reaction. °· Do not drive or perform tasks after treatment until the medicines used to treat your reaction have worn off, or until your caregiver says it is okay. °· If you have hives or a rash: °¨ Take medicines as directed by your caregiver. °¨ You may use an over-the-counter antihistamine (diphenhydramine) as needed. °¨ Apply cold compresses to the skin or take baths in cool water. Avoid hot baths or showers. °SEEK MEDICAL CARE IF:  °· You develop symptoms of an allergic reaction to a new substance. Symptoms may start right away or minutes later. °· You develop a rash, hives, or itching. °· You develop new symptoms. °SEEK IMMEDIATE MEDICAL CARE IF:  °· You have swelling of the mouth, difficulty breathing, or wheezing. °· You have a tight feeling in your chest or throat. °· You develop hives, swelling, or itching all over your body. °· You develop severe vomiting or diarrhea. °· You feel faint or pass out. °This is an emergency. Use your epinephrine injection or anaphylaxis kit as you have been instructed. Call your local emergency services (911 in U.S[place] Even if you improve after the injection, you need to be examined at a hospital emergency department. °MAKE SURE YOU:  °· Understand these instructions. °· Will watch your condition. °· Will get help right away if you are not   doing well or get worse. Document Released: 05/19/2005 Document Revised: 05/24/2013 Document Reviewed: 08/20/2011 [facility] Patient Information 2015 [place], [place]. This information is not intended to replace advice given to you by your health  care provider. Make sure you discuss any questions you have with your health care provider.   Emergency Department Resource Guide 1) Find a Doctor and Pay Out of Pocket Although you won't have to find out who is covered by your insurance plan, it is a good idea to ask around and get recommendations. You will then need to call the office and see if the doctor you have chosen will accept you as a new patient and what types of options they offer for patients who are self-pay. Some doctors offer discounts or will set up payment plans for their patients who do not have insurance, but you will need to ask so you aren't surprised when you get to your appointment.  2) Contact Your Local Health Department Not all health departments have doctors that can see patients for sick visits, but many do, so it is worth a call to see if yours does. If you don't know where your local health department is, you can check in your phone book. The CDC also has a tool to help you locate your state's health department, and many state websites also have listings of all of their local health departments.  3) Find a [facility] If your illness is not likely to be very severe or complicated, you may want to try a walk in clinic. These are popping up all over the country in pharmacies, drugstores, and shopping centers. They're usually staffed by nurse practitioners or physician assistants that have been trained to treat common illnesses and complaints. They're usually fairly quick and inexpensive. However, if you have serious medical issues or chronic medical problems, these are probably not your best option.  No Primary Care Doctor: - Call Health Connect at  [phone] - they can help you locate a primary care doctor that  accepts your insurance, provides certain services, etc. - Physician Referral Service- [phone]  Chronic Pain Problems: Organization         Address  Phone   Notes  [facility]   [phone] Patients need to be referred by their primary care doctor.   Medication Assistance: Organization         Address  Phone   Notes  [facility] Medication [facility] [address] [place], [place] [address] [phone] --Must be a resident of [facility] -- Must have NO insurance coverage whatsoever (no Medicaid/ Medicare, etc.) -- The pt. MUST have a primary care doctor that directs their care regularly and follows them in the community   [facility]ist  [phone]   [employer]  [phone]    Agencies that provide inexpensive medical care: Organization         Address  Phone   Notes  [place]  [phone]   [facility]    [phone]   [facility] [place], [place] [address] [phone]   [place] [address] [place] [phone]   Planned Parenthood    [phone]   [facility]    [phone]   [place] and [address], [place] Phone:  [phone], Fax:  [phone] Hours of Operation:  9  am - 6 pm, M-F.  Also accepts Medicaid/Medicare and self-pay.  [facility] for [address], [address], [place] Phone: [phone], Fax: [phone]. Hours of Operation:  8:30 am - 5:30 pm, M-F.  Also accepts Medicaid and self-pay.  [facility] [place] [address], [place] Phone: [phone]   [place], [place], [place] [phone], Ext. [phone] Mondays & Thursdays: 7-9 AM.  First 15 patients are seen on a first come, first serve basis.    [place] Providers:  Organization         Address  Phone   Notes  [facility] [address], [place] [phone] Also accepts self-pay patients.  [facility] [address], [place]  [phone]   [employer], [address], [place] [phone]   [facility] [address], [place] [phone]   Lucianne Lei [address], [place]   [phone] Only accepts [place] Access [place] patients after they have their name applied to their card.   Self-Pay (no insurance) in [facility]:  Organization         Address  Phone   Notes  Sickle Cell Patients, [facility] [place] [phone]   [facility] [place] [phone]   [facility] [place]  [place], [place], [place] [phone]   [facility]/Dr. Osei-Bonsu  [address] [place] or [address], [place] [phone] Phone number for both [place] and [place] locations is the same.  [facility] [address] [place] [phone]   [facility] [address] [place] or [address] [phone]   [facility] [address], [place] [phone], phone; [phone], fax Sees patients 1st and 3rd Saturday of every month.  Must not qualify for public or private insurance (i.e. Medicaid, Medicare, [place] Health Choice, Veterans' Benefits)  Household income should be no more than 200% of the poverty level The clinic cannot treat you if you are pregnant or think you are pregnant  Sexually transmitted diseases are not treated at the clinic.    Dental Care: Organization         Address  Phone  Notes  [facility] Department of [facility] [place] [phone] Accepts children up to age 28 who are enrolled in [place] or [place]; pregnant women with a Medicaid card; and children who have applied for Medicaid or [place] Health  Choice, but were declined, whose parents can pay a reduced fee at time of service.  [facility] Department of [facility]  [address], [place] [phone] Accepts children up to age 58 who are enrolled in [place] or [place]; pregnant women with a Medicaid card; and children who have applied for Medicaid or [place] Health Choice, but were declined, whose parents can pay a reduced fee at time of service.  [facility] Adult [facility] Access PROGRAM  [place] [phone] Patients are seen by appointment only. Walk-ins are not accepted. [place] will see patients 18 years of  age and older. Monday - Tuesday (8am-5pm) Most Wednesdays (8:30-5pm) $30 per visit, cash only  [facility] Adult Dental Access PROGRAM  [address], [facility] [phone] Patients are seen by appointment only. Walk-ins are not accepted. [place] will see patients 37 years of age and older. One Wednesday Evening (Monthly: Volunteer Based).  $30 per visit, cash only  [place]  [phone] for adults; Children under age 21, call Graduate Pediatric Dentistry at [phone]. Children aged 24-14, please call [phone] to request a pediatric application.  Dental services are provided in all areas of dental care including fillings, crowns and bridges, complete and partial dentures, implants, gum treatment, root canals, and extractions. Preventive care is also provided. Treatment is provided to both adults and children. Patients are selected via a lottery and there is often a waiting list.   [facility] [address] [place]  [phone] [email or web address]   [facility] [address] [place], [place] [phone], Ext. [phone] Second and Fourth Thursday of each month, opens at 6:30 AM; Clinic ends at 9 AM.  Patients are seen on a first-come first-served basis, and a limited number are seen during each  clinic.   [facility]  [address] Hillard Danker [place], [place] [phone]   Eligibility Requirements You must have lived in [place], [place], or [place] counties for at least the last three months.   You cannot be eligible for state or federal sponsored [employer], including [employer], [place], or [employer].   You generally cannot be eligible for healthcare insurance through your employer.    How to apply: Eligibility screenings are held every Tuesday and Wednesday afternoon from 1:00 pm until 4:00 pm. You do not need an appointment for the interview!  [facility] [address], [place], [place]   [place]  [place] Department  [place]  [phone]    Behavioral Health Resources in the Community: Intensive Outpatient Programs Organization         Address  Phone  Notes  [place]. [address] [place], [place] [phone]   [facility] Outpatient [address], [place], [place]   ADS: Alcohol & Drug Svcs [address] [place], [place]   [place] [address],  [place], [place] or [phone]   Substance Abuse Resources Organization         Address  Phone  Notes  Alcohol and Drug Services  [phone]   [place]  [phone]   [facility]   Chinita Pester  [phone]   Residential & Outpatient Substance Abuse Program  [phone]   Psychological Services Organization         Address  Phone  Notes  [facility] [place]  [place]  [phone]   [place] [address], [place] or [phone]    Mobile Crisis Teams Organization         Address  Phone  Notes  [facility], Mobile  Crisis Care Unit  [phone]   [facility]  [address] [place], [place]   [facility] [address] [place] [phone]    Self-Help/Support Groups Organization  Address  Phone             Notes  [place]. of [place] - variety of support groups  [place] Call for more information  Narcotics Anonymous (NA), [facility] [address], [employer] [place]  2 meetings at this location   Special educational needs teacher         Address  Phone  Notes  [facility]AP Residential Treatment [place],    [place]  [phone]   [facility]  [address] [place] [phone], [place], [place]   [place], [place] [phone] Admissions: 8am-3pm M-F  Incentives Substance [place] [address],    [place], [place] [phone]   [facility] [address] [place], [place], [place]   [facility] [address]  [place], [place]   [facility] Programs - Intensive Outpatient [address], [place], [place]   [facility] ([place].,  [place], [place] [phone] or [phone]   [facility] (RTS) [address] [place], [place] Accepts Medicaid  [facility] [place] [address]  [place] [phone] Substance Abuse/Addiction Treatment   [facility] Organization         Address  Phone  Notes  [facility]  [phone]   Domenic Schwab, PhD [address] [place], [place]   [phone] or [phone]   [place], [place] [phone]   [facility] [address], [place], [place] [phone] Insurance/Medicaid/sponsorship through [facility] and Families [address], [address]                                    [place], [place] [phone] [place] [address], [place] [phone]    Dr. Adele Schilder  [phone]   [facility] Dept. 1) [address], [place] 2) [place] 3)  [place] [address], [place] [phone]  [phone]   [place] [phone] or [phone] (After Hours)

## 2014-08-16 NOTE — ED Notes (Signed)
Family to bedside.

## 2014-08-16 NOTE — ED Notes (Signed)
Pt reports a possible allergic reaction to hair dye, reports itching, red skin, angioedema, shortness of breath x30 minutes.

## 2014-08-16 NOTE — ED Notes (Signed)
Pt presents with ? Allergic reaction following using hair dye, states she always uses the same brand.  Erythema to upper trunchal area/arms/neck and face.  Tongue swelling, back of throat patent.  Itching to upper body.  Reports sob, long expirations taken intermittently but no acute respiratory distress noted at this time.

## 2014-08-22 ENCOUNTER — Ambulatory Visit: Payer: Medicare Other | Admitting: Internal Medicine

## 2014-11-27 ENCOUNTER — Other Ambulatory Visit: Payer: Self-pay

## 2015-08-07 ENCOUNTER — Encounter (HOSPITAL_COMMUNITY): Payer: Self-pay | Admitting: Emergency Medicine

## 2015-08-07 ENCOUNTER — Other Ambulatory Visit (HOSPITAL_COMMUNITY)
Admission: RE | Admit: 2015-08-07 | Discharge: 2015-08-07 | Disposition: A | Discharge status: Home / Self Care | Payer: Medicare Other | Source: Ambulatory Visit | Attending: Family Medicine | Admitting: Family Medicine

## 2015-08-07 ENCOUNTER — Emergency Department (INDEPENDENT_AMBULATORY_CARE_PROVIDER_SITE_OTHER)
Admission: EM | Admit: 2015-08-07 | Discharge: 2015-08-07 | Disposition: A | Discharge status: Home / Self Care | Payer: Self-pay | Source: Home / Self Care | Attending: Family Medicine | Admitting: Family Medicine

## 2015-08-07 DIAGNOSIS — E119 Type 2 diabetes mellitus without complications: Secondary | ICD-10-CM | POA: Insufficient documentation

## 2015-08-07 DIAGNOSIS — R350 Frequency of micturition: Secondary | ICD-10-CM

## 2015-08-07 DIAGNOSIS — R3915 Urgency of urination: Secondary | ICD-10-CM

## 2015-08-07 DIAGNOSIS — Z794 Long term (current) use of insulin: Secondary | ICD-10-CM

## 2015-08-07 LAB — POCT URINALYSIS DIP (DEVICE)
Bilirubin Urine: NEGATIVE
Glucose, UA: 250 mg/dL — AB
Hgb urine dipstick: NEGATIVE
Ketones, ur: NEGATIVE mg/dL
LEUKOCYTES UA: NEGATIVE
Nitrite: NEGATIVE
PROTEIN: NEGATIVE mg/dL
SPECIFIC GRAVITY, URINE: 1.02 (ref 1.005–1.030)
UROBILINOGEN UA: 0.2 mg/dL (ref 0.0–1.0)
pH: 5.5 (ref 5.0–8.0)

## 2015-08-07 MED ORDER — FLUCONAZOLE 150 MG PO TABS: ORAL_TABLET | ORAL | Status: DC

## 2015-08-07 MED ORDER — CEPHALEXIN 500 MG PO CAPS: 500.0000 mg | ORAL_CAPSULE | Freq: Four times a day (QID) | ORAL | Status: DC

## 2015-08-07 NOTE — Discharge Instructions (Signed)
How to Avoid Diabetes Problems You can do a lot to prevent or slow down diabetes problems. Following your diabetes plan and taking care of yourself can reduce your risk of serious or life-threatening complications. Below, you will find certain things you can do to prevent diabetes problems. MANAGE YOUR DIABETES Follow your health care provider's, nurse educator's, and dietitian's instructions for managing your diabetes. They will teach you the basics of diabetes care. They can help answer questions you may have. Learn about diabetes and make healthy choices regarding eating and physical activity. Monitor your blood glucose level regularly. Your health care provider will help you decide how often to check your blood glucose level depending on your treatment goals and how well you are meeting them.  DO NOT USE NICOTINE Nicotine and diabetes are a dangerous combination. Nicotine raises your risk for diabetes problems. If you quit using nicotine, you will lower your risk for heart attack, stroke, nerve disease, and kidney disease. Your cholesterol and your blood pressure levels may improve. Your blood circulation will also improve. Do not use any tobacco products, including cigarettes, chewing tobacco, or electronic cigarettes. If you need help quitting, ask your health care provider. KEEP YOUR BLOOD PRESSURE UNDER CONTROL Your health care provider will determine your individualized target blood pressure based on your age, your medicines, how long you have had diabetes, and any other medical conditions you have. Blood pressure consists of two numbers. Generally, the goal is to keep your top number (systolic pressure) at or below 130, and your bottom number (diastolic pressure) at or below 80. Your health care provider may recommend a lower target blood pressure reading, if appropriate. Meal planning, medicines, and exercise can help you reach your target blood pressure. Make sure your health care provider checks  your blood pressure at every visit. KEEP YOUR CHOLESTEROL UNDER CONTROL Normal cholesterol levels will help prevent heart disease and stroke. These are the biggest health problems for people with diabetes. Keeping cholesterol levels under control can also help with blood flow. Have your cholesterol level checked at least once a year. Your health care provider may prescribe a medicine known as a statin. Statins lower your cholesterol. If you are not taking a statin, ask your health care provider if you should be. Meal planning, exercise, and medicines can help you reach your cholesterol targets.  SCHEDULE AND KEEP YOUR ANNUAL PHYSICAL EXAMS AND EYE EXAMS Your health care provider will tell you how often he or she wants to see you depending on your plan of treatment. It is important that you keep these appointments so that possible problems can be identified early and complications can be avoided or treated.  Every visit with your health care provider should include your weight, blood pressure, and an evaluation of your blood glucose control.  Your hemoglobin A1c should be checked:  At least twice a year if you are at your goal.  Every 3 months if there are changes in treatment.  If you are not meeting your goals.  Your blood lipids should be checked yearly. You should also be checked yearly to see if you have protein in your urine (microalbumin).  Schedule a dilated eye exam within 5 years of your diagnosis if you have type 1 diabetes, and then yearly. Schedule a dilated eye exam at diagnosis if you have type 2 diabetes, and then yearly. All exams thereafter can be extended to every 2 to 3 years if one or more exams have been normal. KEEP YOUR  exams thereafter can be extended to every 2 to 3 years if one or more exams have been normal.  KEEP YOUR VACCINES CURRENT  It is recommended that you receive a flu (influenza) vaccine every year. It is also recommended that you receive a pneumonia (pneumococcal) vaccine. If you are 65 years of age or older and have never received a pneumonia vaccine, this  vaccine may be given as a series of two separate shots. Ask your health care provider which additional vaccines may be recommended.  TAKE CARE OF YOUR FEET   Diabetes may cause you to have a poor blood supply (circulation) to your legs and feet. Because of this, the skin may be thinner, break easier, and heal more slowly. You also may have nerve damage in your legs and feet, causing decreased feeling. You may not notice minor injuries to your feet that could lead to serious problems or infections. Taking care of your feet is very important.  Visual foot exams are performed at every routine medical visit. The exams check for cuts, injuries, or other problems with the feet. A comprehensive foot exam should be done yearly. This includes visual inspection as well as assessing foot pulses and testing for loss of sensation. You should also do the following:  · Inspect your feet daily for cuts, calluses, blisters, ingrown toenails, and signs of infection, such as redness, swelling, or pus.  · Wash and dry your feet thoroughly, especially between the toes.  · Avoid soaking your feet regularly in hot water baths.  · Moisturize dry skin with lotion, avoiding areas between your toes.  · Cut toenails straight across and file the edges.  · Avoid shoes that do not fit well or have areas that irritate your skin.  · Avoid going barefooted or wearing only socks. Your feet need protection.  TAKE CARE OF YOUR TEETH  People with poorly controlled diabetes are more likely to have gum (periodontal) disease. These infections make diabetes harder to control. Periodontal diseases, if left untreated, can lead to tooth loss. Brush your teeth twice a day, floss, and see your dentist for checkups and cleaning every 6 months, or 2 times a year.  ASK YOUR HEALTH CARE PROVIDER ABOUT TAKING ASPIRIN  Taking aspirin daily is recommended to help prevent cardiovascular disease in people with and without diabetes. Ask your health care provider if this  would benefit you and what dose he or she would recommend.  DRINK RESPONSIBLY  Moderate amounts of alcohol (less than 1 drink per day for adult women and less than 2 drinks per day for adult men) have a minimal effect on blood glucose if ingested with food. It is important to eat food with alcohol to avoid hypoglycemia. People should avoid alcohol if they have a history of alcohol abuse or dependence, if they are pregnant, and if they have liver disease, pancreatitis, advanced neuropathy, or severe hypertriglyceridemia.  LESSEN STRESS  Living with diabetes can be stressful. When you are under stress, your blood glucose may be affected in two ways:  · Stress hormones may cause your blood glucose to rise.  · You may be distracted from taking good care of yourself.  It is a good idea to be aware of your stress level and make changes that are necessary to help you better manage challenging situations. Support groups, planned relaxation, a hobby you enjoy, meditation, healthy relationships, and exercise all work to lower your stress level. If your efforts do not seem to be helping,   Document Released: 02/04/2011 Document Revised: 06/09/2014 Document Reviewed: 07/13/2013 Elsevier Interactive Patient Education [employer].  Urinary Frequency The number of times a normal person urinates depends upon how much liquid they take in and how much liquid they are losing. If the temperature is hot and there is high humidity, then the person will sweat more and usually breathe a little more frequently. These factors decrease the amount of frequency of urination that would be considered normal. The amount you drink is easily determined, but the amount of fluid lost is  sometimes more difficult to calculate.  Fluid is lost in two ways:  Sensible fluid loss is usually measured by the amount of urine that you get rid of. Losses of fluid can also occur with diarrhea.  Insensible fluid loss is more difficult to measure. It is caused by evaporation. Insensible loss of fluid occurs through breathing and sweating. It usually ranges from a little less than a quart to a little more than a quart of fluid a day. In normal temperatures and activity levels, the average person may urinate 4 to 7 times in a 24-hour period. Needing to urinate more often than that could indicate a problem. If one urinates 4 to 7 times in 24 hours and has large volumes each time, that could indicate a different problem from one who urinates 4 to 7 times a day and has small volumes. The time of urinating is also important. Most urinating should be done during the waking hours. Getting up at night to urinate frequently can indicate some problems. CAUSES  The bladder is the organ in your lower abdomen that holds urine. Like a balloon, it swells some as it fills up. Your nerves sense this and tell you it is time to head for the bathroom. There are a number of reasons that you might feel the need to urinate more often than usual. They include:  Urinary tract infection. This is usually associated with other signs such as burning when you urinate.  In men, problems with the prostate (a walnut-size gland that is located near the tube that carries urine out of your body). There are two reasons why the prostate can cause an increased frequency of urination:  An enlarged prostate that does not let the bladder empty well. If the bladder only half empties when you urinate, then it only has half the capacity to fill before you have to urinate again.  The nerves in the bladder become more hypersensitive with an increased size of the prostate even if the bladder empties completely.  Pregnancy.  Obesity. Excess  weight is more likely to cause a problem for women than for men.  Bladder stones or other bladder problems.  Caffeine.  Alcohol.  Medications. For example, drugs that help the body get rid of extra fluid (diuretics) increase urine production. Some other medicines must be taken with lots of fluids.  Muscle or nerve weakness. This might be the result of a spinal cord injury, a stroke, multiple sclerosis, or Parkinson disease.  Long-standing diabetes can decrease the sensation of the bladder. This loss of sensation makes it harder to sense the bladder needs to be emptied. Over a period of years, the bladder is stretched out by constant overfilling. This weakens the bladder muscles so that the bladder does not empty well and has less capacity to fill with new urine.  Interstitial cystitis (also called painful bladder syndrome). This condition develops because the tissues that line the inside of the bladder  are inflamed (inflammation is the body's way of reacting to injury or infection). It causes pain and frequent urination. It occurs in women more often than in men. DIAGNOSIS   To decide what might be causing your urinary frequency, your health care provider will probably:  Ask about symptoms you have noticed.  Ask about your overall health. This will include questions about any medications you are taking.  Do a physical examination.  Order some tests. These might include:  A blood test to check for diabetes or other health issues that could be contributing to the problem.  Urine testing. This could measure the flow of urine and the pressure on the bladder.  A test of your neurological system (the brain, spinal cord, and nerves). This is the system that senses the need to urinate.  A bladder test to check whether it is emptying completely when you urinate.  Cystoscopy. This test uses a thin tube with a tiny camera on it. It offers a look inside your urethra and bladder to see if there  are problems.  Imaging tests. You might be given a contrast dye and then asked to urinate. X-rays are taken to see how your bladder is working. TREATMENT  It is important for you to be evaluated to determine if the amount or frequency that you have is unusual or abnormal. If it is found to be abnormal, the cause should be determined and this can usually be found out easily. Depending upon the cause, treatment could include medication, stimulation of the nerves, or surgery. There are not too many things that you can do as an individual to change your urinary frequency. It is important that you balance the amount of fluid intake needed to compensate for your activity and the temperature. Medical problems will be diagnosed and taken care of by your physician. There is no particular bladder training such as Kegel exercises that you can do to help urinary frequency. This is an exercise that is usually recommended for people who have leaking of urine when they laugh, cough, or sneeze. HOME CARE INSTRUCTIONS   Take any medications your health care provider prescribed or suggested. Follow the directions carefully.  Practice any lifestyle changes that are recommended. These might include:  Drinking less fluid or drinking at different times of the day. If you need to urinate often during the night, for example, you may need to stop drinking fluids early in the evening.  Cutting down on caffeine or alcohol. They both can make you need to urinate more often than normal. Caffeine is found in coffee, tea, and sodas.  Losing weight, if that is recommended.  Keep a journal or a log. You might be asked to record how much you drink and when and where you feel the need to urinate. This will also help evaluate how well the treatment provided by your physician is working. SEEK MEDICAL CARE IF:   Your need to urinate often gets worse.  You feel increased pain or irritation when you urinate.  You notice blood in  your urine.  You have questions about any medications that your health care provider recommended.  You notice blood, pus, or swelling at the site of any test or treatment procedure.  You develop a fever of more than 100.62F (38.1C). SEEK IMMEDIATE MEDICAL CARE IF:  You develop a fever of more than 102.15F (38.9C).   This information is not intended to replace advice given to you by your health care provider. Make sure  you discuss any questions you have with your health care provider.   Document Released: 03/15/2009 Document Revised: 06/09/2014 Document Reviewed: 03/15/2009 Elsevier Interactive Patient Education [employer].

## 2015-08-07 NOTE — ED Notes (Signed)
Pt here with urine frequency and urge, slight irritation that started 2 days ago Denies back pain or burning sensation

## 2015-08-07 NOTE — ED Provider Notes (Signed)
CSN: [id number]     Arrival date & time 08/07/15  1300 History   First MD Initiated Contact with Patient 08/07/15 1314     Chief Complaint  Patient presents with  . Urinary Tract Infection   (Consider location/radiation/quality/duration/timing/severity/associated sxs/prior Treatment) HPI Comments: 60 year old female with a history of type 2 diabetes mellitus and frequent UTIs presents with complaints of urinary frequency, urgency and meatal irritation. This started approximately 2 days ago. Denies dysuria, back pain, fever, chills, nausea or vomiting. The history is provided by the patient.    Past Medical History  Diagnosis Date  . Diabetes mellitus   . Stroke ([place])   . Colon cancer [facility])    Past Surgical History  Procedure Laterality Date  . Colon surgery    . Esophagogastroduodenoscopy  11/07/2011    Procedure: ESOPHAGOGASTRODUODENOSCOPY (EGD);  Surgeon: Jerene Bears, MD;  Location: [facility];  Service: Gastroenterology;  Laterality: N/A;  . Cholecystectomy     Family History  Problem Relation Age of Onset  . Cancer Other   . Diabetes Other    Social History  Substance Use Topics  . Smoking status: Never Smoker   . Smokeless tobacco: Never Used  . Alcohol Use: No   OB History    No data available     Review of Systems  Constitutional: Negative.   Gastrointestinal: Negative.   Genitourinary: Positive for urgency and frequency. Negative for dysuria, hematuria, flank pain, vaginal discharge and genital sores.  Skin: Negative.   Neurological: Negative.   All other systems reviewed and are negative.   Allergies  Glipizide; Ibuprofen; Iohexol; Rosiglitazone-metformin; Hydrocodone-acetaminophen; Morphine; Pantoprazole; and Sulfamethoxazole  Home Medications   Prior to Admission medications   Medication Sig Start Date End Date Taking? Authorizing Provider  acetaminophen (TYLENOL) 500 MG tablet Take 1 tablet (500 mg total) by mouth every 6 (six) hours as needed.  08/13/14   Domenic Moras, PA-C  Alum & Mag Hydroxide-Simeth (MAGIC MOUTHWASH W/LIDOCAINE) SOLN Take 5 mLs by mouth 4 (four) times daily as needed for mouth pain. Use for sore throat Patient not taking: Reported on 08/10/2014 08/30/13   Antonietta Breach, PA-C  aspirin EC 81 MG tablet Take 81 mg by mouth daily.    Historical Provider, MD  azithromycin (ZITHROMAX) 250 MG tablet Take 1 tablet (250 mg total) by mouth daily. Take first 2 tablets together, then 1 every day until finished. 08/13/14   Domenic Moras, PA-C  benzonatate (TESSALON) 100 MG capsule Take 1 capsule (100 mg total) by mouth every 8 (eight) hours. 08/13/14   Domenic Moras, PA-C  Calcium-Magnesium-Vitamin D (CALCIUM 500 PO) Take 1 tablet by mouth daily.    Historical Provider, MD  cephALEXin (KEFLEX) 500 MG capsule Take 1 capsule (500 mg total) by mouth 4 (four) times daily. 08/07/15   Janne Napoleon, NP  cholecalciferol (VITAMIN D) 1000 UNITS tablet Take 1,000 Units by mouth daily.    Historical Provider, MD  diphenhydrAMINE (BENADRYL) 25 MG tablet Take 2 tablets (50 mg total) by mouth every 4 (four) hours as needed for itching. 08/16/14   Evelina Bucy, MD  EPINEPHrine 0.3 mg/0.3 mL IJ SOAJ injection Inject 0.3 mLs (0.3 mg total) into the muscle as needed (Tongue swelling, anaphylaxis). 08/16/14   Evelina Bucy, MD  famotidine (PEPCID) 20 MG tablet Take 1 tablet (20 mg total) by mouth 2 (two) times daily. 08/16/14   Evelina Bucy, MD  ferrous sulfate 325 (65 FE) MG tablet Take 325 mg by mouth daily with breakfast.  Historical Provider, MD  fluconazole (DIFLUCAN) 150 MG tablet 1 tab po x 1. May repeat in 72 hours if no improvement 08/07/15   Janne Napoleon, NP  insulin aspart (NOVOLOG) 100 UNIT/ML injection Inject 20-30 Units into the skin 2 (two) times daily. Takes 20 units every night and 30units every morning    Historical Provider, MD  metFORMIN (GLUCOPHAGE) 1000 MG tablet Take 1,000 mg by mouth 2 (two) times daily with a meal.    Historical Provider, MD  predniSONE  (DELTASONE) 10 MG tablet Take 4 tablets (40 mg total) by mouth daily. 08/17/14   Evelina Bucy, MD   Meds Ordered and Administered this Visit  Medications - No data to display  BP 116/70 mmHg  Pulse 80  Temp(Src) 97.8 F (36.6 C) (Oral)  SpO2 95% No data found.   Physical Exam  Constitutional: She is oriented to person, place, and time. She appears well-developed and well-nourished. No distress.  Eyes: EOM are normal.  Neck: Normal range of motion. Neck supple.  Cardiovascular: Normal rate.   Pulmonary/Chest: Effort normal. No respiratory distress.  Musculoskeletal: She exhibits no edema.  Neurological: She is alert and oriented to person, place, and time. She exhibits normal muscle tone.  Skin: Skin is warm and dry.  Psychiatric: She has a normal mood and affect.  Nursing note and vitals reviewed.   ED Course  Procedures (including critical care time)  Labs Review Labs Reviewed  POCT URINALYSIS DIP (DEVICE) - Abnormal; Notable for the following:    Glucose, UA 250 (*)    All other components within normal limits  URINE CULTURE   Results for orders placed or performed during the hospital encounter of 08/07/15  POCT urinalysis dip (device)  Result Value Ref Range   Glucose, UA 250 (A) NEGATIVE mg/dL   Bilirubin Urine NEGATIVE NEGATIVE   Ketones, ur NEGATIVE NEGATIVE mg/dL   Specific Gravity, Urine 1.020 1.005 - 1.030   Hgb urine dipstick NEGATIVE NEGATIVE   pH 5.5 5.0 - 8.0   Protein, ur NEGATIVE NEGATIVE mg/dL   Urobilinogen, UA 0.2 0.0 - 1.0 mg/dL   Nitrite NEGATIVE NEGATIVE   Leukocytes, UA NEGATIVE NEGATIVE     Imaging Review No results found.   Visual Acuity Review  Right Eye Distance:   Left Eye Distance:   Bilateral Distance:    Right Eye Near:   Left Eye Near:    Bilateral Near:         MDM   1. Urinary urgency   2. Urinary frequency   3. Type 2 diabetes mellitus without complication, with long-term current use of insulin (HCC)     Keflex as dir Lots of fluids Diflucan x 2 Urine culture    Janne Napoleon, NP 08/07/15 1357

## 2015-08-09 LAB — URINE CULTURE: Special Requests: NORMAL

## 2015-09-16 ENCOUNTER — Emergency Department (HOSPITAL_BASED_OUTPATIENT_CLINIC_OR_DEPARTMENT_OTHER)
Admission: EM | Admit: 2015-09-16 | Discharge: 2015-09-16 | Disposition: A | Discharge status: Home / Self Care | Payer: Medicare Other | Attending: Emergency Medicine | Admitting: Emergency Medicine

## 2015-09-16 ENCOUNTER — Encounter (HOSPITAL_BASED_OUTPATIENT_CLINIC_OR_DEPARTMENT_OTHER): Payer: Self-pay | Admitting: *Deleted

## 2015-09-16 ENCOUNTER — Emergency Department (HOSPITAL_BASED_OUTPATIENT_CLINIC_OR_DEPARTMENT_OTHER): Payer: Medicare Other

## 2015-09-16 DIAGNOSIS — Z7982 Long term (current) use of aspirin: Secondary | ICD-10-CM | POA: Insufficient documentation

## 2015-09-16 DIAGNOSIS — K2970 Gastritis, unspecified, without bleeding: Secondary | ICD-10-CM | POA: Insufficient documentation

## 2015-09-16 DIAGNOSIS — Z7952 Long term (current) use of systemic steroids: Secondary | ICD-10-CM | POA: Insufficient documentation

## 2015-09-16 DIAGNOSIS — Z792 Long term (current) use of antibiotics: Secondary | ICD-10-CM | POA: Insufficient documentation

## 2015-09-16 DIAGNOSIS — Z8673 Personal history of transient ischemic attack (TIA), and cerebral infarction without residual deficits: Secondary | ICD-10-CM | POA: Insufficient documentation

## 2015-09-16 DIAGNOSIS — E119 Type 2 diabetes mellitus without complications: Secondary | ICD-10-CM | POA: Insufficient documentation

## 2015-09-16 DIAGNOSIS — R109 Unspecified abdominal pain: Secondary | ICD-10-CM

## 2015-09-16 DIAGNOSIS — IMO0001 Reserved for inherently not codable concepts without codable children: Secondary | ICD-10-CM

## 2015-09-16 DIAGNOSIS — K219 Gastro-esophageal reflux disease without esophagitis: Secondary | ICD-10-CM | POA: Insufficient documentation

## 2015-09-16 DIAGNOSIS — Z7984 Long term (current) use of oral hypoglycemic drugs: Secondary | ICD-10-CM | POA: Insufficient documentation

## 2015-09-16 DIAGNOSIS — Z794 Long term (current) use of insulin: Secondary | ICD-10-CM | POA: Insufficient documentation

## 2015-09-16 DIAGNOSIS — Z85038 Personal history of other malignant neoplasm of large intestine: Secondary | ICD-10-CM | POA: Insufficient documentation

## 2015-09-16 LAB — CBC WITH DIFFERENTIAL/PLATELET
Basophils Absolute: 0 10*3/uL (ref 0.0–0.1)
Basophils Relative: 0 %
Eosinophils Absolute: 0.2 10*3/uL (ref 0.0–0.7)
Eosinophils Relative: 3 %
HEMATOCRIT: 37.9 % (ref 36.0–46.0)
Hemoglobin: 12.8 g/dL (ref 12.0–15.0)
LYMPHS ABS: 2 10*3/uL (ref 0.7–4.0)
Lymphocytes Relative: 31 %
MCH: 26 pg (ref 26.0–34.0)
MCHC: 33.8 g/dL (ref 30.0–36.0)
MCV: 76.9 fL — ABNORMAL LOW (ref 78.0–100.0)
MONOS PCT: 7 %
Monocytes Absolute: 0.4 10*3/uL (ref 0.1–1.0)
NEUTROS PCT: 59 %
Neutro Abs: 3.8 10*3/uL (ref 1.7–7.7)
Platelets: 220 10*3/uL (ref 150–400)
RBC: 4.93 MIL/uL (ref 3.87–5.11)
RDW: 14.1 % (ref 11.5–15.5)
WBC: 6.4 10*3/uL (ref 4.0–10.5)

## 2015-09-16 LAB — COMPREHENSIVE METABOLIC PANEL
ALBUMIN: 3.6 g/dL (ref 3.5–5.0)
ALT: 14 U/L (ref 14–54)
ANION GAP: 5 (ref 5–15)
AST: 18 U/L (ref 15–41)
Alkaline Phosphatase: 86 U/L (ref 38–126)
BILIRUBIN TOTAL: 0.4 mg/dL (ref 0.3–1.2)
BUN: 14 mg/dL (ref 6–20)
CO2: 27 mmol/L (ref 22–32)
CREATININE: 0.53 mg/dL (ref 0.44–1.00)
Calcium: 9.2 mg/dL (ref 8.9–10.3)
Chloride: 108 mmol/L (ref 101–111)
GFR calc non Af Amer: >60 mL/min (ref >60)
GLUCOSE: 117 mg/dL — AB (ref 65–99)
Potassium: 3.3 mmol/L — ABNORMAL LOW (ref 3.5–5.1)
SODIUM: 140 mmol/L (ref 135–145)
Total Protein: 6.9 g/dL (ref 6.5–8.1)

## 2015-09-16 LAB — CBG MONITORING, ED: Glucose-Capillary: 192 mg/dL — ABNORMAL HIGH (ref 65–99)

## 2015-09-16 LAB — LIPASE, BLOOD: Lipase: 30 U/L (ref 11–51)

## 2015-09-16 LAB — URINALYSIS, ROUTINE W REFLEX MICROSCOPIC
Bilirubin Urine: NEGATIVE
Hgb urine dipstick: NEGATIVE
KETONES UR: NEGATIVE mg/dL
Leukocytes, UA: NEGATIVE
Nitrite: NEGATIVE
PROTEIN: NEGATIVE mg/dL
Specific Gravity, Urine: 1.023 (ref 1.005–1.030)
pH: 6.5 (ref 5.0–8.0)

## 2015-09-16 LAB — URINE MICROSCOPIC-ADD ON

## 2015-09-16 LAB — TROPONIN I

## 2015-09-16 MED ORDER — ONDANSETRON HCL 4 MG/2ML IJ SOLN
4.0000 mg | Freq: Once | INTRAMUSCULAR | Status: AC
  Administered 2015-09-16: 4 mg via INTRAVENOUS
  Filled 2015-09-16: qty 2

## 2015-09-16 MED ORDER — GI COCKTAIL ~~LOC~~
30.0000 mL | Freq: Once | ORAL | Status: AC
  Administered 2015-09-16: 30 mL via ORAL
  Filled 2015-09-16: qty 30

## 2015-09-16 MED ORDER — SUCRALFATE 1 GM/10ML PO SUSP: 1.0000 g | Freq: Three times a day (TID) | ORAL | Status: DC

## 2015-09-16 MED ORDER — FAMOTIDINE 20 MG PO TABS: 20.0000 mg | ORAL_TABLET | Freq: Two times a day (BID) | ORAL | Status: DC

## 2015-09-16 MED ORDER — DICYCLOMINE HCL 10 MG/ML IM SOLN
20.0000 mg | Freq: Once | INTRAMUSCULAR | Status: AC
  Administered 2015-09-16: 20 mg via INTRAMUSCULAR
  Filled 2015-09-16: qty 2

## 2015-09-16 NOTE — ED Notes (Signed)
CBG 192 

## 2015-09-16 NOTE — ED Provider Notes (Addendum)
CSN: [id number]     Arrival date & time 09/16/15  0107 History   First MD Initiated Contact with Patient 09/16/15 0304     Chief Complaint  Patient presents with  . Abdominal Pain     (Consider location/radiation/quality/duration/timing/severity/associated sxs/prior Treatment) Patient is a 60 y.o. female presenting with abdominal pain. The history is provided by the patient.  Abdominal Pain Pain location:  LUQ Pain quality: cramping   Pain radiation: lower abdomen. Pain severity:  Moderate Onset quality:  Gradual Duration: several months. Timing:  Constant Progression:  Waxing and waning Context: not alcohol use, not sick contacts and not trauma   Relieved by:  Nothing Worsened by:  Nothing tried Ineffective treatments:  None tried Associated symptoms: flatus   Associated symptoms: no anorexia, no chest pain, no fever, no shortness of breath and no vomiting   Risk factors: aspirin     Past Medical History  Diagnosis Date  . Diabetes mellitus   . Stroke ([place])   . Colon cancer [facility])    Past Surgical History  Procedure Laterality Date  . Colon surgery    . Esophagogastroduodenoscopy  11/07/2011    Procedure: ESOPHAGOGASTRODUODENOSCOPY (EGD);  Surgeon: Jerene Bears, MD;  Location: [facility];  Service: Gastroenterology;  Laterality: N/A;  . Cholecystectomy     Family History  Problem Relation Age of Onset  . Cancer Other   . Diabetes Other    Social History  Substance Use Topics  . Smoking status: Never Smoker   . Smokeless tobacco: Never Used  . Alcohol Use: No   OB History    No data available     Review of Systems  Constitutional: Negative for fever.  Respiratory: Negative for shortness of breath.   Cardiovascular: Negative for chest pain.  Gastrointestinal: Positive for abdominal pain and flatus. Negative for vomiting and anorexia.  All other systems reviewed and are negative.     Allergies  Glipizide; Ibuprofen; Iohexol; Rosiglitazone-metformin;  Hydrocodone-acetaminophen; Morphine; Pantoprazole; and Sulfamethoxazole  Home Medications   Prior to Admission medications   Medication Sig Start Date End Date Taking? Authorizing Provider  amoxicillin (AMOXIL) 500 MG capsule Take 500 mg by mouth 3 (three) times daily.   Yes Historical Provider, MD  acetaminophen (TYLENOL) 500 MG tablet Take 1 tablet (500 mg total) by mouth every 6 (six) hours as needed. 08/13/14   Domenic Moras, PA-C  Alum & Mag Hydroxide-Simeth (MAGIC MOUTHWASH W/LIDOCAINE) SOLN Take 5 mLs by mouth 4 (four) times daily as needed for mouth pain. Use for sore throat Patient not taking: Reported on 08/10/2014 08/30/13   Antonietta Breach, PA-C  aspirin EC 81 MG tablet Take 81 mg by mouth daily.    Historical Provider, MD  azithromycin (ZITHROMAX) 250 MG tablet Take 1 tablet (250 mg total) by mouth daily. Take first 2 tablets together, then 1 every day until finished. 08/13/14   Domenic Moras, PA-C  benzonatate (TESSALON) 100 MG capsule Take 1 capsule (100 mg total) by mouth every 8 (eight) hours. 08/13/14   Domenic Moras, PA-C  Calcium-Magnesium-Vitamin D (CALCIUM 500 PO) Take 1 tablet by mouth daily.    Historical Provider, MD  cephALEXin (KEFLEX) 500 MG capsule Take 1 capsule (500 mg total) by mouth 4 (four) times daily. 08/07/15   Janne Napoleon, NP  cholecalciferol (VITAMIN D) 1000 UNITS tablet Take 1,000 Units by mouth daily.    Historical Provider, MD  diphenhydrAMINE (BENADRYL) 25 MG tablet Take 2 tablets (50 mg total) by mouth every 4 (four)  hours as needed for itching. 08/16/14   Evelina Bucy, MD  EPINEPHrine 0.3 mg/0.3 mL IJ SOAJ injection Inject 0.3 mLs (0.3 mg total) into the muscle as needed (Tongue swelling, anaphylaxis). 08/16/14   Evelina Bucy, MD  famotidine (PEPCID) 20 MG tablet Take 1 tablet (20 mg total) by mouth 2 (two) times daily. 08/16/14   Evelina Bucy, MD  ferrous sulfate 325 (65 FE) MG tablet Take 325 mg by mouth daily with breakfast.     Historical Provider, MD  fluconazole  (DIFLUCAN) 150 MG tablet 1 tab po x 1. May repeat in 72 hours if no improvement 08/07/15   Janne Napoleon, NP  insulin aspart (NOVOLOG) 100 UNIT/ML injection Inject 20-30 Units into the skin 2 (two) times daily. Takes 20 units every night and 30units every morning    Historical Provider, MD  metFORMIN (GLUCOPHAGE) 1000 MG tablet Take 1,000 mg by mouth 2 (two) times daily with a meal.    Historical Provider, MD  predniSONE (DELTASONE) 10 MG tablet Take 4 tablets (40 mg total) by mouth daily. 08/17/14   Evelina Bucy, MD   BP 135/75 mmHg  Pulse 73  Temp(Src) 98 F (36.7 C)  Ht 5\' 4"  (1.626 m)  Wt 161 lb (73.029 kg)  BMI 27.62 kg/m2  SpO2 98% Physical Exam  Constitutional: She is oriented to person, place, and time. She appears well-developed and well-nourished.  HENT:  Head: Normocephalic and atraumatic.  Mouth/Throat: Oropharynx is clear and moist.  Eyes: Conjunctivae are normal. Pupils are equal, round, and reactive to light.  Neck: Normal range of motion. Neck supple.  Cardiovascular: Normal rate, regular rhythm and intact distal pulses.   Pulmonary/Chest: Effort normal and breath sounds normal. No respiratory distress. She has no wheezes. She has no rales.  Abdominal: Soft. There is no tenderness. There is no rebound and no guarding.  Gassy throughout  Musculoskeletal: Normal range of motion.  Neurological: She is alert and oriented to person, place, and time.  Skin: Skin is warm and dry.  Psychiatric: She has a normal mood and affect.    ED Course  Procedures (including critical care time) Labs Review Labs Reviewed  URINALYSIS, ROUTINE W REFLEX MICROSCOPIC (NOT AT [facility]) - Abnormal; Notable for the following:    Glucose, UA >1000 (*)    All other components within normal limits  URINE MICROSCOPIC-ADD ON - Abnormal; Notable for the following:    Squamous Epithelial / LPF 0-5 (*)    Bacteria, UA RARE (*)    All other components within normal limits  CBG MONITORING, ED - Abnormal;  Notable for the following:    Glucose-Capillary 192 (*)    All other components within normal limits  CBC WITH DIFFERENTIAL/PLATELET  COMPREHENSIVE METABOLIC PANEL  LIPASE, BLOOD  TROPONIN I    Imaging Review No results found. I have personally reviewed and evaluated these images and lab results as part of my medical decision-making.   EKG Interpretation None      Date: 09/16/2015  Rate: 71  Rhythm: normal sinus rhythm  QRS Axis: normal  Intervals: normal  ST/T Wave abnormalities: normal  Conduction Disutrbances: none  Narrative Interpretation: unremarkable    MDM   Final diagnoses:  None    Filed Vitals:   09/16/15 0500 09/16/15 0530  BP: 111/62 102/60  Pulse: 66   Temp:    Resp: 16 23   Results for orders placed or performed during the hospital encounter of 09/16/15  Urinalysis, Routine w reflex microscopic (not at  [facility])  Result Value Ref Range   Color, Urine YELLOW YELLOW   APPearance CLEAR CLEAR   Specific Gravity, Urine 1.023 1.005 - 1.030   pH 6.5 5.0 - 8.0   Glucose, UA >1000 (A) NEGATIVE mg/dL   Hgb urine dipstick NEGATIVE NEGATIVE   Bilirubin Urine NEGATIVE NEGATIVE   Ketones, ur NEGATIVE NEGATIVE mg/dL   Protein, ur NEGATIVE NEGATIVE mg/dL   Nitrite NEGATIVE NEGATIVE   Leukocytes, UA NEGATIVE NEGATIVE  Urine microscopic-add on  Result Value Ref Range   Squamous Epithelial / LPF 0-5 (A) NONE SEEN   WBC, UA 0-5 0 - 5 WBC/hpf   RBC / HPF 0-5 0 - 5 RBC/hpf   Bacteria, UA RARE (A) NONE SEEN  CBC with Differential  Result Value Ref Range   WBC 6.4 4.0 - 10.5 K/uL   RBC 4.93 3.87 - 5.11 MIL/uL   Hemoglobin 12.8 12.0 - 15.0 g/dL   HCT 37.9 36.0 - 46.0 %   MCV 76.9 (L) 78.0 - 100.0 fL   MCH 26.0 26.0 - 34.0 pg   MCHC 33.8 30.0 - 36.0 g/dL   RDW 14.1 11.5 - 15.5 %   Platelets 220 150 - 400 K/uL   Neutrophils Relative % 59 %   Neutro Abs 3.8 1.7 - 7.7 K/uL   Lymphocytes Relative 31 %   Lymphs Abs 2.0 0.7 - 4.0 K/uL   Monocytes Relative 7 %    Monocytes Absolute 0.4 0.1 - 1.0 K/uL   Eosinophils Relative 3 %   Eosinophils Absolute 0.2 0.0 - 0.7 K/uL   Basophils Relative 0 %   Basophils Absolute 0.0 0.0 - 0.1 K/uL  Comprehensive metabolic panel  Result Value Ref Range   Sodium 140 135 - 145 mmol/L   Potassium 3.3 (L) 3.5 - 5.1 mmol/L   Chloride 108 101 - 111 mmol/L   CO2 27 22 - 32 mmol/L   Glucose, Bld 117 (H) 65 - 99 mg/dL   BUN 14 6 - 20 mg/dL   Creatinine, Ser 0.53 0.44 - 1.00 mg/dL   Calcium 9.2 8.9 - 10.3 mg/dL   Total Protein 6.9 6.5 - 8.1 g/dL   Albumin 3.6 3.5 - 5.0 g/dL   AST 18 15 - 41 U/L   ALT 14 14 - 54 U/L   Alkaline Phosphatase 86 38 - 126 U/L   Total Bilirubin 0.4 0.3 - 1.2 mg/dL   GFR calc non Af Amer >60 >60 mL/min   GFR calc Af Amer >60 >60 mL/min   Anion gap 5 5 - 15  Lipase, blood  Result Value Ref Range   Lipase 30 11 - 51 U/L  Troponin I  Result Value Ref Range   Troponin I <0.03 <0.031 ng/mL  POC CBG, ED  Result Value Ref Range   Glucose-Capillary 192 (H) 65 - 99 mg/dL   Ct Renal Stone Study  09/16/2015  CLINICAL DATA:  Initial evaluation for acute upper left abdominal pain radiating into right upper abdomen. EXAM: CT ABDOMEN AND PELVIS WITHOUT CONTRAST TECHNIQUE: Multidetector CT imaging of the abdomen and pelvis was performed following the standard protocol without IV contrast. COMPARISON:  Prior CT from 05/17/2012. FINDINGS: Mild bibasilar atelectasis/scarring present within the visualized lung bases. Visualized lungs are otherwise clear. Limited noncontrast evaluation of the liver is unremarkable. Gallbladder is surgically absent. Mild prominence of the common bile duct clear related to post cholecystectomy changes. Spleen within normal limits. Adrenal glands are normal. Pancreas demonstrates a normal unenhanced appearance. Kidneys are  equal in size with no evidence for nephrolithiasis or hydronephrosis. No radiopaque calculi seen along the course of either renal collecting system. There is no  hydroureter. The gastric wall along the lesser curvature of the stomach a somewhat ill-defined with shaggy margins. There is adjacent inflammatory stranding within the adjacent mesenteric fat along the lesser curvature, just superior to the pancreas (series 2, image 18). Few enlarged lymph nodes within this area measure up to 13 mm. Finding may be related underlying ulcer disease, as there is question of a small focal ulcer along the lesser curvature (series 2, image 16). Acute gastritis could also be considered. Possible malignancy is not excluded. Remainder of the stomach is within normal limits. Small bowel of normal caliber and appearance without evidence of obstruction or acute inflammation. Appendix normal. Anastomotic suture present at the distal colon. Colon otherwise unremarkable. Bladder within normal limits. Uterus is absent. Ovaries within normal limits. Tiny 9 mm cyst noted within the right ovary. No other adenopathy within the abdomen and pelvis. No free air or fluid. No acute osseous abnormality. No worrisome lytic or blastic osseous lesions. Scattered soft tissue emphysema present within the subcutaneous fat of the anterior abdominal wall, likely related to injection sites. Focus of skin breakdown posterior to the left ischial tuberosity, likely a small decubitus ulcer (series 2, image 86). IMPRESSION: 1. Inflammatory stranding along the lesser curvature of the stomach with adjacent regional adenopathy. Finding is of uncertain etiology, and may reflect sequela of ulcerative disease or possibly acute gastritis. Possible gastric malignancy is not excluded. Consider GI consultation for possible further evaluation with upper endoscopy. 2. Focal skin breakdown posterior to the left ischial tuberosity, likely a focal decubitus ulcer. No significant inflammatory changes at this time. Electronically Signed   By: Jeannine Boga M.D.   On: 09/16/2015 05:52   Medications  ondansetron (ZOFRAN) injection  4 mg (4 mg Intravenous Given 09/16/15 0426)  gi cocktail (Maalox,Lidocaine,Donnatal) (30 mLs Oral Given 09/16/15 0432)  dicyclomine (BENTYL) injection 20 mg (20 mg Intramuscular Given 09/16/15 0431)   Pain free post medication.  Resting comfortably.  At the time of CT patient then stated she was allergic to contrast and had tongue swelling 3 years ago.  This was not in her records.    Given gas and brackish taste in mouth with gas and belching the symptoms are consistent with GERD with gastritis and gas.   Patient will need a bland gerd friendly diet.  Will start pepcid and carafate and have patient follow up with GI for her colonoscopy which is overdue given her history and also for endoscopy to ensure no ulcers and no cancer.  Patient informed that malignancy is not excluded on CT so it is imperative that she follow up closely with GI .  Patient verbalizes and agrees to follow up.  Strict return precautions for fever intractable pain rigid abdomen vomiting bleeding or any concerns.        Veatrice Kells, MD 09/16/15 [id number]  Veatrice Kells, MD 09/16/15 [phone]

## 2015-09-16 NOTE — ED Notes (Signed)
C/o upper left abdominal pain that radiates into her right upper abdomen. C/o nausea denies vomiting. Denies any fevers. Describes pain as sharp at times and then other times the pain is dull. States pain is constant. Also, c/o burning with urination

## 2016-05-01 ENCOUNTER — Ambulatory Visit (HOSPITAL_COMMUNITY)
Admission: EM | Admit: 2016-05-01 | Discharge: 2016-05-01 | Disposition: A | Discharge status: Home / Self Care | Payer: Medicare Other | Attending: Family Medicine | Admitting: Family Medicine

## 2016-05-01 ENCOUNTER — Ambulatory Visit (INDEPENDENT_AMBULATORY_CARE_PROVIDER_SITE_OTHER): Payer: Medicare Other

## 2016-05-01 ENCOUNTER — Encounter (HOSPITAL_COMMUNITY): Payer: Self-pay | Admitting: *Deleted

## 2016-05-01 DIAGNOSIS — S0083XA Contusion of other part of head, initial encounter: Secondary | ICD-10-CM

## 2016-05-01 DIAGNOSIS — T148XXA Other injury of unspecified body region, initial encounter: Secondary | ICD-10-CM

## 2016-05-01 DIAGNOSIS — W19XXXA Unspecified fall, initial encounter: Secondary | ICD-10-CM

## 2016-05-01 DIAGNOSIS — S63502A Unspecified sprain of left wrist, initial encounter: Secondary | ICD-10-CM

## 2016-05-01 NOTE — ED Triage Notes (Signed)
The patient presented to the [facility] with a complaint of a fall that occurred 3 days ago. The patient reported that she lost her balance and hit her left arm and face on the concrete. The patient had a laceration to the face lateral to the right eye and she also complained of left arm and right knee pain. The patient denied any LOC.

## 2016-05-01 NOTE — Discharge Instructions (Signed)
Your x-rays were negative for any sign of fracture. It appears that he sprained her left wrist. Put ice around the wrist for the next couple days and wear the wrist splint for about one week. Also apply ice around the right eye to help with swelling. This should go away soon. He may also apply ice over the right knee. You may ambulate and bear weight as tolerated. For any new symptoms problems or worsening may return or follow-up with your doctor.

## 2016-05-01 NOTE — ED Provider Notes (Signed)
CSN: [id number]     Arrival date & time 05/01/16  1538 History   First MD Initiated Contact with Patient 05/01/16 1712     Chief Complaint  Patient presents with  . Fall   (Consider location/radiation/quality/duration/timing/severity/associated sxs/prior Treatment) 60 year old female states she tripped and fell 2 days ago. This resulted in a contusion over the right eye, pain and swelling to the left wrist and hand and an abrasion to the right anterior knee. She denies loss of consciousness, problems with vision, speech, hearing, neck pain or other extremity pain. She states that she is ambulatory with full weightbearing. She has 2 small superficial abrasions to the right knee. Last tetanus 4 years ago.      Past Medical History:  Diagnosis Date  . Colon cancer ([place])   . Diabetes mellitus   . Stroke [facility])    Past Surgical History:  Procedure Laterality Date  . CHOLECYSTECTOMY    . COLON SURGERY    . ESOPHAGOGASTRODUODENOSCOPY  11/07/2011   Procedure: ESOPHAGOGASTRODUODENOSCOPY (EGD);  Surgeon: Jerene Bears, MD;  Location: [facility];  Service: Gastroenterology;  Laterality: N/A;   Family History  Problem Relation Age of Onset  . Cancer Other   . Diabetes Other    Social History  Substance Use Topics  . Smoking status: Never Smoker  . Smokeless tobacco: Never Used  . Alcohol use No   OB History    No data available     Review of Systems  Constitutional: Positive for activity change. Negative for chills, fatigue and fever.  HENT: Negative.   Eyes: Negative for photophobia, pain, discharge, redness, itching and visual disturbance.  Respiratory: Negative.   Cardiovascular: Negative.   Gastrointestinal: Negative.   Genitourinary: Negative.   Musculoskeletal:       As per HPI  Skin: Positive for wound. Negative for color change, pallor and rash.  Neurological: Negative.  Negative for dizziness, tremors, syncope, speech difficulty and headaches.  Hematological:  Bruises/bleeds easily.  Psychiatric/Behavioral: Negative.   All other systems reviewed and are negative.   Allergies  Glipizide; Ibuprofen; Iohexol; Rosiglitazone-metformin; Hydrocodone-acetaminophen; Morphine; Pantoprazole; and Sulfamethoxazole  Home Medications   Prior to Admission medications   Medication Sig Start Date End Date Taking? Authorizing Provider  aspirin EC 81 MG tablet Take 81 mg by mouth daily.   Yes Historical Provider, MD  ferrous sulfate 325 (65 FE) MG tablet Take 325 mg by mouth daily with breakfast.    Yes Historical Provider, MD  insulin aspart (NOVOLOG) 100 UNIT/ML injection Inject 20-30 Units into the skin 2 (two) times daily. Takes 20 units every night and 30units every morning   Yes Historical Provider, MD  insulin lispro (HUMALOG) 100 UNIT/ML injection Inject into the skin 3 (three) times daily before meals.   Yes Historical Provider, MD  acetaminophen (TYLENOL) 500 MG tablet Take 1 tablet (500 mg total) by mouth every 6 (six) hours as needed. 08/13/14   Domenic Moras, PA-C  amoxicillin (AMOXIL) 500 MG capsule Take 500 mg by mouth 3 (three) times daily.    Historical Provider, MD  azithromycin (ZITHROMAX) 250 MG tablet Take 1 tablet (250 mg total) by mouth daily. Take first 2 tablets together, then 1 every day until finished. 08/13/14   Domenic Moras, PA-C  benzonatate (TESSALON) 100 MG capsule Take 1 capsule (100 mg total) by mouth every 8 (eight) hours. 08/13/14   Domenic Moras, PA-C  Calcium-Magnesium-Vitamin D (CALCIUM 500 PO) Take 1 tablet by mouth daily.    Historical Provider,  MD  cephALEXin (KEFLEX) 500 MG capsule Take 1 capsule (500 mg total) by mouth 4 (four) times daily. 08/07/15   Janne Napoleon, NP  cholecalciferol (VITAMIN D) 1000 UNITS tablet Take 1,000 Units by mouth daily.    Historical Provider, MD  EPINEPHrine 0.3 mg/0.3 mL IJ SOAJ injection Inject 0.3 mLs (0.3 mg total) into the muscle as needed (Tongue swelling, anaphylaxis). 08/16/14   Evelina Bucy, MD   famotidine (PEPCID) 20 MG tablet Take 1 tablet (20 mg total) by mouth 2 (two) times daily. 08/16/14   Evelina Bucy, MD  famotidine (PEPCID) 20 MG tablet Take 1 tablet (20 mg total) by mouth 2 (two) times daily. 09/16/15   April Palumbo, MD  metFORMIN (GLUCOPHAGE) 1000 MG tablet Take 1,000 mg by mouth 2 (two) times daily with a meal.    Historical Provider, MD  predniSONE (DELTASONE) 10 MG tablet Take 4 tablets (40 mg total) by mouth daily. 08/17/14   Evelina Bucy, MD  sucralfate (CARAFATE) 1 GM/10ML suspension Take 10 mLs (1 g total) by mouth 4 (four) times daily -  with meals and at bedtime. 09/16/15   April Palumbo, MD   Meds Ordered and Administered this Visit  Medications - No data to display  BP 114/64 (BP Location: Right Arm)   Pulse 82   Temp 98.2 F (36.8 C) (Oral)   Resp 16   SpO2 97%  No data found.   Physical Exam  Constitutional: She is oriented to person, place, and time. She appears well-developed and well-nourished. No distress.  HENT:  Right Ear: External ear normal.  Left Ear: External ear normal.  Nose: Nose normal.  Mouth/Throat: Oropharynx is clear and moist.  Bilateral TMs are normal. No hemotympanum. Oropharynx is clear. Soft palate rises symmetrically. Uvula and tongue are midline. No apparent intraoral injury.  There is minor right periorbital swelling and ecchymosis. Palpation of the para orbital bones reveal no crepitus or mobility. Minor superficial linear abrasion just right of the lateral aspect of the eye. No laceration.  Eyes: Conjunctivae and EOM are normal. Pupils are equal, round, and reactive to light.  Right eye proper with normal EOM. Sclera clear. Anterior chambers clear. No hyphema. No evidence of bleeding.  Neck: Normal range of motion. Neck supple.  Cardiovascular: Normal rate, regular rhythm and intact distal pulses.   Pulmonary/Chest: Effort normal and breath sounds normal. No respiratory distress.  Abdominal: Soft.  Musculoskeletal: Normal  range of motion. She exhibits no edema.  Left wrist and hand with swelling. Patient will not let me palpate the hand. There is tenderness to the wrist. No deformity. Radial pulse is 1+ however patient would not allow me to attempt to obtain a good palpation. She is able to wiggle her fingers. Distal warmth and color normal. Brisk capillary refill.  Lymphadenopathy:    She has no cervical adenopathy.  Neurological: She is alert and oriented to person, place, and time. She displays normal reflexes. No cranial nerve deficit. She exhibits normal muscle tone. Coordination normal.  Skin: Skin is warm and dry.  Psychiatric: She has a normal mood and affect.  Nursing note and vitals reviewed.   Urgent Care Course   Clinical Course     Procedures (including critical care time)  Labs Review Labs Reviewed - No data to display  Imaging Review Dg Wrist Complete Left  Result Date: 05/01/2016 CLINICAL DATA:  [place] 2 nights ago on concrete, injuring LEFT hand and wrist. EXAM: LEFT WRIST - COMPLETE 3+ VIEW COMPARISON:  LEFT hand radiograph May 01, 2016 at 1752 hours FINDINGS: There is no evidence of fracture or dislocation. Osteopenia. There is no evidence of arthropathy or other focal bone abnormality. Mild dorsal wrist soft tissue swelling without subcutaneous gas or radiopaque foreign bodies. IMPRESSION: No acute osseous process. Osteopenia. Electronically Signed   By: Elon Alas M.D.   On: 05/01/2016 18:08   Dg Hand Complete Left  Result Date: 05/01/2016 CLINICAL DATA:  [place] [address] forward Tuesday night injuring LEFT hand on concrete, LEFT hand pain and swelling at metacarpals EXAM: LEFT HAND - COMPLETE 3+ VIEW COMPARISON:  None FINDINGS: Diffuse osseous demineralization. Joint spaces preserved. No acute fracture, dislocation, or bone destruction. IMPRESSION: No acute bony abnormalities. Diffuse osseous demineralization. Electronically Signed   By: Lavonia Dana M.D.   On: 05/01/2016 18:02      Visual Acuity Review  Right Eye Distance:   Left Eye Distance:   Bilateral Distance:    Right Eye Near:   Left Eye Near:    Bilateral Near:         MDM   1. Fall, initial encounter   2. Contusion of face, initial encounter   3. Abrasion   4. Wrist sprain, left, initial encounter    Your x-rays were negative for any sign of fracture. It appears that he sprained her left wrist. Put ice around the wrist for the next couple days and wear the wrist splint for about one week. Also apply ice around the right eye to help with swelling. This should go away soon. He may also apply ice over the right knee. You may ambulate and bear weight as tolerated. For any new symptoms problems or worsening may return or follow-up with your doctor. Wrist splint.    Janne Napoleon, NP 05/01/16 [phone]

## 2016-11-10 ENCOUNTER — Encounter (HOSPITAL_COMMUNITY): Payer: Self-pay | Admitting: Emergency Medicine

## 2016-11-10 ENCOUNTER — Ambulatory Visit (HOSPITAL_COMMUNITY)
Admission: EM | Admit: 2016-11-10 | Discharge: 2016-11-10 | Disposition: A | Discharge status: Home / Self Care | Payer: Medicare Other | Attending: Internal Medicine | Admitting: Internal Medicine

## 2016-11-10 DIAGNOSIS — Z9889 Other specified postprocedural states: Secondary | ICD-10-CM | POA: Insufficient documentation

## 2016-11-10 DIAGNOSIS — Z8673 Personal history of transient ischemic attack (TIA), and cerebral infarction without residual deficits: Secondary | ICD-10-CM | POA: Insufficient documentation

## 2016-11-10 DIAGNOSIS — Z809 Family history of malignant neoplasm, unspecified: Secondary | ICD-10-CM | POA: Insufficient documentation

## 2016-11-10 DIAGNOSIS — K2100 Gastro-esophageal reflux disease with esophagitis, without bleeding: Secondary | ICD-10-CM

## 2016-11-10 DIAGNOSIS — Z85038 Personal history of other malignant neoplasm of large intestine: Secondary | ICD-10-CM | POA: Insufficient documentation

## 2016-11-10 DIAGNOSIS — Z79899 Other long term (current) drug therapy: Secondary | ICD-10-CM | POA: Insufficient documentation

## 2016-11-10 DIAGNOSIS — R079 Chest pain, unspecified: Secondary | ICD-10-CM | POA: Insufficient documentation

## 2016-11-10 DIAGNOSIS — E119 Type 2 diabetes mellitus without complications: Secondary | ICD-10-CM | POA: Insufficient documentation

## 2016-11-10 DIAGNOSIS — Z7982 Long term (current) use of aspirin: Secondary | ICD-10-CM | POA: Insufficient documentation

## 2016-11-10 DIAGNOSIS — R0789 Other chest pain: Secondary | ICD-10-CM

## 2016-11-10 DIAGNOSIS — Z9049 Acquired absence of other specified parts of digestive tract: Secondary | ICD-10-CM | POA: Insufficient documentation

## 2016-11-10 DIAGNOSIS — R300 Dysuria: Secondary | ICD-10-CM

## 2016-11-10 DIAGNOSIS — Z888 Allergy status to other drugs, medicaments and biological substances status: Secondary | ICD-10-CM | POA: Insufficient documentation

## 2016-11-10 DIAGNOSIS — N390 Urinary tract infection, site not specified: Secondary | ICD-10-CM | POA: Insufficient documentation

## 2016-11-10 DIAGNOSIS — Z794 Long term (current) use of insulin: Secondary | ICD-10-CM | POA: Insufficient documentation

## 2016-11-10 DIAGNOSIS — Z833 Family history of diabetes mellitus: Secondary | ICD-10-CM | POA: Insufficient documentation

## 2016-11-10 LAB — POCT URINALYSIS DIP (DEVICE)
Bilirubin Urine: NEGATIVE
Glucose, UA: >=1000 mg/dL — AB
Hgb urine dipstick: NEGATIVE
Ketones, ur: NEGATIVE mg/dL
Leukocytes, UA: NEGATIVE
Nitrite: NEGATIVE
Protein, ur: NEGATIVE mg/dL
Specific Gravity, Urine: 1.01 (ref 1.005–1.030)
Urobilinogen, UA: 0.2 mg/dL (ref 0.0–1.0)
pH: 5 (ref 5.0–8.0)

## 2016-11-10 MED ORDER — PHENAZOPYRIDINE HCL 200 MG PO TABS: 200.0000 mg | ORAL_TABLET | Freq: Three times a day (TID) | ORAL | 0 refills | Status: DC

## 2016-11-10 MED ORDER — OMEPRAZOLE 40 MG PO CPDR: 40.0000 mg | DELAYED_RELEASE_CAPSULE | Freq: Every day | ORAL | 0 refills | Status: DC

## 2016-11-10 MED ORDER — GI COCKTAIL ~~LOC~~
30.0000 mL | Freq: Once | ORAL | Status: AC
  Administered 2016-11-10: 30 mL via ORAL

## 2016-11-10 MED ORDER — GI COCKTAIL ~~LOC~~
ORAL | Status: AC
  Filled 2016-11-10: qty 30

## 2016-11-10 MED ORDER — CEPHALEXIN 500 MG PO CAPS: 500.0000 mg | ORAL_CAPSULE | Freq: Four times a day (QID) | ORAL | 0 refills | Status: DC

## 2016-11-10 NOTE — ED Provider Notes (Signed)
CSN: [id number]     Arrival date & time 11/10/16  1452 History   None    Chief Complaint  Patient presents with  . Urinary Tract Infection  . Gastroesophageal Reflux   (Consider location/radiation/quality/duration/timing/severity/associated sxs/prior Treatment) Patient c/o dysuria and she c/o GERD sx's.     The history is provided by the patient.  Urinary Tract Infection  Pain quality:  Burning Pain severity:  Moderate Onset quality:  Sudden Duration:  2 days Timing:  Constant Progression:  Worsening Relieved by:  Nothing Worsened by:  Nothing Ineffective treatments:  None tried Gastroesophageal Reflux     Past Medical History:  Diagnosis Date  . Colon cancer ([place])   . Diabetes mellitus   . Stroke [facility])    Past Surgical History:  Procedure Laterality Date  . CHOLECYSTECTOMY    . COLON SURGERY    . ESOPHAGOGASTRODUODENOSCOPY  11/07/2011   Procedure: ESOPHAGOGASTRODUODENOSCOPY (EGD);  Surgeon: Jerene Bears, MD;  Location: [facility];  Service: Gastroenterology;  Laterality: N/A;   Family History  Problem Relation Age of Onset  . Cancer Other   . Diabetes Other    Social History  Substance Use Topics  . Smoking status: Never Smoker  . Smokeless tobacco: Never Used  . Alcohol use No   OB History    No data available     Review of Systems  Constitutional: Negative.   HENT: Negative.   Eyes: Negative.   Respiratory: Negative.   Cardiovascular: Negative.   Gastrointestinal: Negative.   Endocrine: Negative.   Genitourinary: Negative.   Musculoskeletal: Negative.   Skin: Negative.   Allergic/Immunologic: Negative.   Neurological: Negative.   Hematological: Negative.     Allergies  Glipizide; Ibuprofen; Iohexol; Rosiglitazone-metformin; Hydrocodone-acetaminophen; Morphine; Pantoprazole; and Sulfamethoxazole  Home Medications   Prior to Admission medications   Medication Sig Start Date End Date Taking? Authorizing Provider  acetaminophen (TYLENOL)  500 MG tablet Take 1 tablet (500 mg total) by mouth every 6 (six) hours as needed. 08/13/14   Domenic Moras, PA-C  amoxicillin (AMOXIL) 500 MG capsule Take 500 mg by mouth 3 (three) times daily.    [provider]  aspirin EC 81 MG tablet Take 81 mg by mouth daily.    [provider]  azithromycin (ZITHROMAX) 250 MG tablet Take 1 tablet (250 mg total) by mouth daily. Take first 2 tablets together, then 1 every day until finished. 08/13/14   Domenic Moras, PA-C  benzonatate (TESSALON) 100 MG capsule Take 1 capsule (100 mg total) by mouth every 8 (eight) hours. 08/13/14   Domenic Moras, PA-C  Calcium-Magnesium-Vitamin D (CALCIUM 500 PO) Take 1 tablet by mouth daily.    [provider]  cephALEXin (KEFLEX) 500 MG capsule Take 1 capsule (500 mg total) by mouth 4 (four) times daily. 08/07/15   Janne Napoleon, NP  cephALEXin (KEFLEX) 500 MG capsule Take 1 capsule (500 mg total) by mouth 4 (four) times daily. 11/10/16   Lysbeth Penner, FNP  cholecalciferol (VITAMIN D) 1000 UNITS tablet Take 1,000 Units by mouth daily.    [provider]  EPINEPHrine 0.3 mg/0.3 mL IJ SOAJ injection Inject 0.3 mLs (0.3 mg total) into the muscle as needed (Tongue swelling, anaphylaxis). 08/16/14   Evelina Bucy, MD  famotidine (PEPCID) 20 MG tablet Take 1 tablet (20 mg total) by mouth 2 (two) times daily. 08/16/14   Evelina Bucy, MD  famotidine (PEPCID) 20 MG tablet Take 1 tablet (20 mg total) by mouth 2 (two) times daily.  09/16/15   Palumbo, April, MD  ferrous sulfate 325 (65 FE) MG tablet Take 325 mg by mouth daily with breakfast.     [provider]  insulin aspart (NOVOLOG) 100 UNIT/ML injection Inject 20-30 Units into the skin 2 (two) times daily. Takes 20 units every night and 30units every morning    [provider]  insulin lispro (HUMALOG) 100 UNIT/ML injection Inject into the skin 3 (three) times daily before meals.    [provider]  metFORMIN (GLUCOPHAGE) 1000 MG  tablet Take 1,000 mg by mouth 2 (two) times daily with a meal.    [provider]  omeprazole (PRILOSEC) 40 MG capsule Take 1 capsule (40 mg total) by mouth daily. 11/10/16   Lysbeth Penner, FNP  phenazopyridine (PYRIDIUM) 200 MG tablet Take 1 tablet (200 mg total) by mouth 3 (three) times daily. 11/10/16   Lysbeth Penner, FNP  predniSONE (DELTASONE) 10 MG tablet Take 4 tablets (40 mg total) by mouth daily. 08/17/14   Evelina Bucy, MD  sucralfate (CARAFATE) 1 GM/10ML suspension Take 10 mLs (1 g total) by mouth 4 (four) times daily -  with meals and at bedtime. 09/16/15   Palumbo, April, MD   Meds Ordered and Administered this Visit   Medications  gi cocktail (Maalox,Lidocaine,Donnatal) (not administered)    BP 122/70 (BP Location: Right Arm)   Pulse 77   Temp 98.2 F (36.8 C) (Oral)   Resp (!) 1   SpO2 97%  No data found.   Physical Exam  Constitutional: She is oriented to person, place, and time. She appears well-developed and well-nourished.  HENT:  Head: Normocephalic and atraumatic.  Right Ear: External ear normal.  Left Ear: External ear normal.  Mouth/Throat: Oropharynx is clear and moist.  Eyes: Conjunctivae and EOM are normal. Pupils are equal, round, and reactive to light.  Neck: Normal range of motion. Neck supple.  Cardiovascular: Normal rate, regular rhythm and normal heart sounds.   Pulmonary/Chest: Effort normal and breath sounds normal.  Neurological: She is alert and oriented to person, place, and time.  Nursing note and vitals reviewed.   Urgent Care Course     Procedures (including critical care time)  Labs Review Labs Reviewed  POCT URINALYSIS DIP (DEVICE) - Abnormal; Notable for the following:       Result Value   Glucose, UA >=1000 (*)    All other components within normal limits    Imaging Review No results found.   Visual Acuity Review  Right Eye Distance:   Left Eye Distance:   Bilateral Distance:    Right Eye Near:    Left Eye Near:    Bilateral Near:         MDM   1. Dysuria   2. Other chest pain   3. Gastroesophageal reflux disease with esophagitis    GI cocktail Prilosec 40mg  po qd #14 Keflex 500mg  po qid x 7 days Pyridium 200mg  one po tid x 2d UA cx      Lysbeth Penner, FNP 11/10/16 1745

## 2016-11-10 NOTE — ED Triage Notes (Addendum)
Dull, sharp discomfort with an empty stomach, reports belching hot liquid back into mouth.  Onset 3 months ago and is intermittent  uti symptoms started Friday, urgency to go to bathroom.  Intermittent burning with urination

## 2016-11-10 NOTE — ED Notes (Signed)
Patient sent to the bathroom for specimen, with instructions

## 2016-11-12 LAB — URINE CULTURE: Culture: <10000 — AB

## 2016-11-28 ENCOUNTER — Encounter (HOSPITAL_COMMUNITY): Payer: Self-pay | Admitting: Emergency Medicine

## 2016-11-28 ENCOUNTER — Emergency Department (HOSPITAL_COMMUNITY)
Admission: EM | Admit: 2016-11-28 | Discharge: 2016-11-28 | Disposition: A | Discharge status: Home / Self Care | Payer: Medicare Other | Attending: Emergency Medicine | Admitting: Emergency Medicine

## 2016-11-28 DIAGNOSIS — E1165 Type 2 diabetes mellitus with hyperglycemia: Secondary | ICD-10-CM | POA: Insufficient documentation

## 2016-11-28 DIAGNOSIS — N3000 Acute cystitis without hematuria: Secondary | ICD-10-CM | POA: Insufficient documentation

## 2016-11-28 DIAGNOSIS — Z85038 Personal history of other malignant neoplasm of large intestine: Secondary | ICD-10-CM | POA: Insufficient documentation

## 2016-11-28 DIAGNOSIS — Z794 Long term (current) use of insulin: Secondary | ICD-10-CM | POA: Insufficient documentation

## 2016-11-28 DIAGNOSIS — Z7982 Long term (current) use of aspirin: Secondary | ICD-10-CM | POA: Insufficient documentation

## 2016-11-28 DIAGNOSIS — R739 Hyperglycemia, unspecified: Secondary | ICD-10-CM

## 2016-11-28 DIAGNOSIS — Z79899 Other long term (current) drug therapy: Secondary | ICD-10-CM | POA: Insufficient documentation

## 2016-11-28 DIAGNOSIS — Z8673 Personal history of transient ischemic attack (TIA), and cerebral infarction without residual deficits: Secondary | ICD-10-CM | POA: Insufficient documentation

## 2016-11-28 LAB — CBG MONITORING, ED
Glucose-Capillary: 370 mg/dL — ABNORMAL HIGH (ref 65–99)
Glucose-Capillary: 291 mg/dL — ABNORMAL HIGH (ref 65–99)

## 2016-11-28 LAB — I-STAT CHEM 8, ED
BUN: 15 mg/dL (ref 6–20)
CHLORIDE: 99 mmol/L — AB (ref 101–111)
Calcium, Ion: 1.08 mmol/L — ABNORMAL LOW (ref 1.15–1.40)
Creatinine, Ser: 0.6 mg/dL (ref 0.44–1.00)
GLUCOSE: 401 mg/dL — AB (ref 65–99)
HCT: 39 % (ref 36.0–46.0)
HEMOGLOBIN: 13.3 g/dL (ref 12.0–15.0)
POTASSIUM: 3.7 mmol/L (ref 3.5–5.1)
SODIUM: 136 mmol/L (ref 135–145)
TCO2: 24 mmol/L (ref 0–100)

## 2016-11-28 LAB — URINALYSIS, ROUTINE W REFLEX MICROSCOPIC
BACTERIA UA: NONE SEEN
BILIRUBIN URINE: NEGATIVE
Glucose, UA: >=500 mg/dL — AB
Ketones, ur: 20 mg/dL — AB
NITRITE: NEGATIVE
PH: 5 (ref 5.0–8.0)
Protein, ur: 30 mg/dL — AB
SPECIFIC GRAVITY, URINE: 1.035 — AB (ref 1.005–1.030)
Squamous Epithelial / LPF: NONE SEEN

## 2016-11-28 MED ORDER — CIPROFLOXACIN HCL 500 MG PO TABS
500.0000 mg | ORAL_TABLET | Freq: Once | ORAL | Status: AC
  Administered 2016-11-28: 500 mg via ORAL
  Filled 2016-11-28: qty 1

## 2016-11-28 MED ORDER — CIPROFLOXACIN HCL 500 MG PO TABS: 500.0000 mg | ORAL_TABLET | Freq: Two times a day (BID) | ORAL | 0 refills | Status: DC

## 2016-11-28 MED ORDER — FAMOTIDINE 20 MG PO TABS
20.0000 mg | ORAL_TABLET | Freq: Once | ORAL | Status: AC
  Administered 2016-11-28: 20 mg via ORAL
  Filled 2016-11-28: qty 1

## 2016-11-28 MED ORDER — SODIUM CHLORIDE 0.9 % IV BOLUS (SEPSIS)
1000.0000 mL | Freq: Once | INTRAVENOUS | Status: AC
  Administered 2016-11-28: 1000 mL via INTRAVENOUS

## 2016-11-28 MED ORDER — INSULIN ASPART 100 UNIT/ML ~~LOC~~ SOLN
5.0000 [IU] | Freq: Once | SUBCUTANEOUS | Status: AC
  Administered 2016-11-28: 5 [IU] via INTRAVENOUS
  Filled 2016-11-28: qty 1

## 2016-11-28 MED ORDER — ACETAMINOPHEN 325 MG PO TABS
650.0000 mg | ORAL_TABLET | Freq: Once | ORAL | Status: AC
  Administered 2016-11-28: 650 mg via ORAL
  Filled 2016-11-28: qty 2

## 2016-11-28 MED ORDER — GI COCKTAIL ~~LOC~~
30.0000 mL | Freq: Once | ORAL | Status: AC
  Administered 2016-11-28: 30 mL via ORAL
  Filled 2016-11-28: qty 30

## 2016-11-28 NOTE — ED Triage Notes (Signed)
Pt is c/o headache, urinary problems like she has a lot of pressure down in the perineal region and sometimes she is not able to make it to the bathroom to urinate  Pt states her urine has a strong odor  Pt is also c/o nausea

## 2016-11-28 NOTE — Discharge Instructions (Signed)
Take the antibiotic until gone. It should be $4 at [facility]. Recheck if you get a fever, vomiting, abdominal or flank pain. Take the pyridium for the pain on urination. Monitor your blood sugars closely. You can start taking Pepcid, Protonix, or Nexium over-the-counter as needed for your heartburn symptoms. Your urine culture should be resulted in about 2 days, you'll be contacted if you need to change the antibiotic. Please monitor your blood sugars closely, look at the diabetic diet information again.

## 2016-11-28 NOTE — ED Provider Notes (Signed)
[facility] DEPT Provider Note   CSN: [id number] Arrival date & time: 11/28/16  1660  By signing my name below, I, Mayer Masker, attest that this documentation has been prepared under the direction and in the presence of Rolland Porter, MD. Electronically Signed: Mayer Masker, Scribe. 11/28/16. 3:21 AM.  Time seen 02:30 AM  History   Chief Complaint Chief Complaint  Patient presents with  . Urinary Frequency  The history is provided by the patient. No language interpreter was used.    HPI Comments: [name] is a 61 y.o. female with PMHx of DM and GERD who presents to the Emergency Department complaining of constant, gradually worsening pressure when she urinates since 2 weeks. She notes the pressure is in her urethral area when she urinates. She has associated increased urinary frequency, nausea, and right-sided HA since yesterday. She was seen by Dr. Sallyanne Havers at [facility] and was prescribed Keflex on June 11, with no relief.She only took the medication for 2 days. Pt has been eating/drinking normally but states she is constantly thirsty and her sugars have been 200-300 mg/dl. She reports her sugars have not been very good due to her recent sickness. She denies hematuria, blurry or double vision, numbness/tingling in her arms or legs, vomiting, or abdominal pain. She has not tried any medication for her headache. She [facility] twice a year to [facility] for her PCP. Pt is on disability for DM and stroke. She does not smoke or drink.   She states she has been having some GERD symptoms. She states omeprazole made her tongue tingle so she quit taking it and feels like she has an allergy to it.  PCP OOT  Past Medical History:  Diagnosis Date  . Colon cancer ([place])   . Diabetes mellitus   . Stroke [facility])     Patient Active Problem List   Diagnosis Date Noted  . Rectal bleeding 04/10/2012  . Supratherapeutic INR 04/10/2012  . Heme positive stool 04/10/2012  . Melena 11/07/2011    . Coagulopathy ([place]) 11/07/2011  . H/O colon cancer 11/07/2011  . OTHER ANAPHYLACTIC REACTION 06/20/2010  . ULCERATION OF INTESTINE 04/05/2010  . ANEMIA 04/02/2010  . SCABIES 07/06/2009  . UPPER RESPIRATORY INFECTION 07/06/2009  . PRURITUS, VAGINAL 01/25/2009  . URINARY INCONTINENCE 01/25/2009  . CANDIDIASIS, VAGINAL 11/06/2008  . EXTERNAL OTITIS 07/26/2008  . CHEST PAIN 05/05/2008  . EPISTAXIS 10/07/2007  . COLONIC POLYPS, HX OF 02/09/2007  . ANEMIA, B12 DEFICIENCY 01/19/2007  . SINUSITIS, ACUTE NOS 01/19/2007  . HYPERLIPIDEMIA 11/10/2006  . HYPERTENSION 11/10/2006  . Unspecified cerebral artery occlusion with cerebral infarction 06/02/2001  . PATENT FORAMEN OVALE 04/21/2001  . DIABETES MELLITUS, TYPE II, UNCONTROLLED 09/01/1999    Past Surgical History:  Procedure Laterality Date  . CHOLECYSTECTOMY    . COLON SURGERY    . ESOPHAGOGASTRODUODENOSCOPY  11/07/2011   Procedure: ESOPHAGOGASTRODUODENOSCOPY (EGD);  Surgeon: Jerene Bears, MD;  Location: [facility];  Service: Gastroenterology;  Laterality: N/A;    OB History    No data available       Home Medications    Prior to Admission medications   Medication Sig Start Date End Date Taking? Authorizing Provider  aspirin EC 81 MG tablet Take 81 mg by mouth daily.   Yes [provider]  ferrous sulfate 325 (65 FE) MG tablet Take 325 mg by mouth daily with breakfast.    Yes [provider]  insulin aspart (NOVOLOG) 100 UNIT/ML injection Inject 15-20 Units into  the skin 2 (two) times daily. Takes 20 units every night and 15 units every morning   Yes [provider]  insulin detemir (LEVEMIR) 100 UNIT/ML injection Inject 100 Units into the skin daily.   Yes [provider]  acetaminophen (TYLENOL) 500 MG tablet Take 1 tablet (500 mg total) by mouth every 6 (six) hours as needed. Patient not taking: Reported on 11/28/2016 08/13/14   Domenic Moras, PA-C  cephALEXin (KEFLEX) 500 MG capsule Take 1  capsule (500 mg total) by mouth 4 (four) times daily. Patient not taking: Reported on 11/28/2016 11/10/16   Lysbeth Penner, FNP  ciprofloxacin (CIPRO) 500 MG tablet Take 1 tablet (500 mg total) by mouth 2 (two) times daily. 11/28/16   Rolland Porter, MD  EPINEPHrine 0.3 mg/0.3 mL IJ SOAJ injection Inject 0.3 mLs (0.3 mg total) into the muscle as needed (Tongue swelling, anaphylaxis). 08/16/14   Evelina Bucy, MD  famotidine (PEPCID) 20 MG tablet Take 1 tablet (20 mg total) by mouth 2 (two) times daily. Patient not taking: Reported on 11/28/2016 09/16/15   Palumbo, April, MD  omeprazole (PRILOSEC) 40 MG capsule Take 1 capsule (40 mg total) by mouth daily. Patient not taking: Reported on 11/28/2016 11/10/16   Lysbeth Penner, FNP  phenazopyridine (PYRIDIUM) 200 MG tablet Take 1 tablet (200 mg total) by mouth 3 (three) times daily. Patient not taking: Reported on 11/28/2016 11/10/16   Lysbeth Penner, FNP  sucralfate (CARAFATE) 1 GM/10ML suspension Take 10 mLs (1 g total) by mouth 4 (four) times daily -  with meals and at bedtime. Patient not taking: Reported on 11/28/2016 09/16/15   Randal Buba, April, MD    Family History Family History  Problem Relation Age of Onset  . Cancer Other   . Diabetes Other     Social History Social History  Substance Use Topics  . Smoking status: Never Smoker  . Smokeless tobacco: Never Used  . Alcohol use No  On disability for diabetes and a prior stroke   Allergies   Glipizide; Ibuprofen; Iohexol; Rosiglitazone-metformin; Hydrocodone-acetaminophen; Morphine; Pantoprazole; and Sulfamethoxazole   Review of Systems Review of Systems  Constitutional: Negative for appetite change.  Eyes: Negative for visual disturbance.  Gastrointestinal: Positive for nausea. Negative for abdominal pain and vomiting.  Genitourinary: Positive for dysuria and frequency. Negative for hematuria.  Neurological: Positive for headaches. Negative for numbness.  All other systems reviewed  and are negative.    Physical Exam Updated Vital Signs BP (!) 137/57 (BP Location: Left Arm)   Pulse 91   Temp 97.6 F (36.4 C) (Oral)   Resp 18   Ht 5\' 4"  (1.626 m)   Wt 155 lb (70.3 kg)   SpO2 98%   BMI 26.61 kg/m   Vital signs normal    Physical Exam  Constitutional: She is oriented to person, place, and time. She appears well-developed and well-nourished.  Non-toxic appearance. She does not appear ill. No distress.  HENT:  Head: Normocephalic and atraumatic.  Right Ear: External ear normal.  Left Ear: External ear normal.  Nose: Nose normal. No mucosal edema or rhinorrhea.  Mouth/Throat: Oropharynx is clear and moist and mucous membranes are normal. No dental abscesses or uvula swelling.  Eyes: Conjunctivae and EOM are normal. Pupils are equal, round, and reactive to light.  Neck: Normal range of motion and full passive range of motion without pain. Neck supple.  Cardiovascular: Normal rate, regular rhythm and normal heart sounds.  Exam reveals no gallop and no friction  rub.   No murmur heard. Pulmonary/Chest: Effort normal and breath sounds normal. No respiratory distress. She has no wheezes. She has no rhonchi. She has no rales. She exhibits no tenderness and no crepitus.  Abdominal: Soft. Normal appearance and bowel sounds are normal. She exhibits no distension. There is no tenderness. There is no rebound and no guarding.  Musculoskeletal: Normal range of motion. She exhibits no edema or tenderness.  Moves all extremities well.   Neurological: She is alert and oriented to person, place, and time. She has normal strength. No cranial nerve deficit.  Skin: Skin is warm, dry and intact. No rash noted. No erythema. No pallor.  Psychiatric: She has a normal mood and affect. Her speech is normal and behavior is normal. Her mood appears not anxious.  Nursing note and vitals reviewed.    ED Treatments / Results  Labs (all labs ordered are listed, but only abnormal results  are displayed) Results for orders placed or performed during the hospital encounter of 11/28/16  Urinalysis, Routine w reflex microscopic- may I&O cath if menses  Result Value Ref Range   Color, Urine YELLOW YELLOW   APPearance HAZY (A) CLEAR   Specific Gravity, Urine 1.035 (H) 1.005 - 1.030   pH 5.0 5.0 - 8.0   Glucose, UA >=500 (A) NEGATIVE mg/dL   Hgb urine dipstick SMALL (A) NEGATIVE   Bilirubin Urine NEGATIVE NEGATIVE   Ketones, ur 20 (A) NEGATIVE mg/dL   Protein, ur 30 (A) NEGATIVE mg/dL   Nitrite NEGATIVE NEGATIVE   Leukocytes, UA LARGE (A) NEGATIVE   RBC / HPF 6-30 0 - 5 RBC/hpf   WBC, UA TOO NUMEROUS TO COUNT 0 - 5 WBC/hpf   Bacteria, UA NONE SEEN NONE SEEN   Squamous Epithelial / LPF NONE SEEN NONE SEEN  CBG monitoring, ED  Result Value Ref Range   Glucose-Capillary 370 (H) 65 - 99 mg/dL   Comment 1 Notify RN   I-stat Chem 8, ED  Result Value Ref Range   Sodium 136 135 - 145 mmol/L   Potassium 3.7 3.5 - 5.1 mmol/L   Chloride 99 (L) 101 - 111 mmol/L   BUN 15 6 - 20 mg/dL   Creatinine, Ser 0.60 0.44 - 1.00 mg/dL   Glucose, Bld 401 (H) 65 - 99 mg/dL   Calcium, Ion 1.08 (L) 1.15 - 1.40 mmol/L   TCO2 24 0 - 100 mmol/L   Hemoglobin 13.3 12.0 - 15.0 g/dL   HCT 39.0 36.0 - 46.0 %  CBG monitoring, ED  Result Value Ref Range   Glucose-Capillary 291 (H) 65 - 99 mg/dL   Laboratory interpretation all normal except except for hyperglycemia, UTI    EKG  EKG Interpretation None       Radiology No results found.  Procedures Procedures (including critical care time)  Medications Ordered in ED Medications  gi cocktail (Maalox,Lidocaine,Donnatal) (30 mLs Oral Given 11/28/16 0317)  famotidine (PEPCID) tablet 20 mg (20 mg Oral Given 11/28/16 0317)  acetaminophen (TYLENOL) tablet 650 mg (650 mg Oral Given 11/28/16 0317)  ciprofloxacin (CIPRO) tablet 500 mg (500 mg Oral Given 11/28/16 0317)  insulin aspart (novoLOG) injection 5 Units (5 Units Intravenous Given 11/28/16  0529)  sodium chloride 0.9 % bolus 1,000 mL (1,000 mLs Intravenous New Bag/Given 11/28/16 0517)     Initial Impression / Assessment and Plan / ED Course  I have reviewed the triage vital signs and the nursing notes.  Pertinent labs & imaging results that were available during  my care of the patient were reviewed by me and considered in my medical decision making (see chart for details).     DIAGNOSTIC STUDIES: Oxygen Saturation is 98% on RA, normal by my interpretation.    COORDINATION OF CARE: 2:33 AM Discussed treatment plan with pt at bedside and pt agreed to plan.After reviewing her urinalysis I reviewed her laboratory testing from June 11. She had a urine culture done at that time with less than 10,000 colonies per high-powered healed. She was started on oral Cipro since the oral Keflex had not worked. When rechecked her CBG her glucose was elevated, i-STAT 8 was done. Her i-STAT 8 shows her glucose was 400. She was given a liter of IV fluids and 5 units IV insulin. At time of discharge her CBG had improved to 291.  Final Clinical Impressions(s) / ED Diagnoses   Final diagnoses:  Acute cystitis without hematuria  Hyperglycemia    New Prescriptions New Prescriptions   CIPROFLOXACIN (CIPRO) 500 MG TABLET    Take 1 tablet (500 mg total) by mouth 2 (two) times daily.    Plan discharge  Rolland Porter, MD, FACEP   I personally performed the services described in this documentation, which was scribed in my presence. The recorded information has been reviewed and considered.  Rolland Porter, MD, Barbette Or, MD 11/28/16 [phone]

## 2016-11-29 LAB — URINE CULTURE: Culture: <10000 — AB

## 2017-04-18 ENCOUNTER — Encounter (HOSPITAL_COMMUNITY): Payer: Self-pay | Admitting: Emergency Medicine

## 2017-04-18 ENCOUNTER — Emergency Department (HOSPITAL_COMMUNITY)
Admission: EM | Admit: 2017-04-18 | Discharge: 2017-04-18 | Disposition: A | Discharge status: Home / Self Care | Payer: Medicare Other | Attending: Emergency Medicine | Admitting: Emergency Medicine

## 2017-04-18 ENCOUNTER — Other Ambulatory Visit: Payer: Self-pay

## 2017-04-18 DIAGNOSIS — H66001 Acute suppurative otitis media without spontaneous rupture of ear drum, right ear: Secondary | ICD-10-CM

## 2017-04-18 DIAGNOSIS — Z79899 Other long term (current) drug therapy: Secondary | ICD-10-CM | POA: Insufficient documentation

## 2017-04-18 DIAGNOSIS — E119 Type 2 diabetes mellitus without complications: Secondary | ICD-10-CM | POA: Insufficient documentation

## 2017-04-18 DIAGNOSIS — Z794 Long term (current) use of insulin: Secondary | ICD-10-CM | POA: Insufficient documentation

## 2017-04-18 MED ORDER — AMOXICILLIN 500 MG PO CAPS
500.0000 mg | ORAL_CAPSULE | Freq: Once | ORAL | Status: AC
  Administered 2017-04-18: 500 mg via ORAL
  Filled 2017-04-18: qty 1

## 2017-04-18 MED ORDER — AMOXICILLIN 500 MG PO CAPS: 500.0000 mg | ORAL_CAPSULE | Freq: Two times a day (BID) | ORAL | 0 refills | Status: DC

## 2017-04-18 MED ORDER — ACETAMINOPHEN 500 MG PO TABS
1000.0000 mg | ORAL_TABLET | Freq: Once | ORAL | Status: AC
  Administered 2017-04-18: 1000 mg via ORAL
  Filled 2017-04-18: qty 2

## 2017-04-18 NOTE — ED Triage Notes (Signed)
Patient complaining of right ear pain. Patient states the pain started around 7 pm tonight. Pain is 9/10. Patient took a tylenol sinus about 6 pm but it is not alleviating the pain.

## 2017-04-18 NOTE — ED Provider Notes (Signed)
[facility] DEPT Provider Note   CSN: [id number] Arrival date & time: 04/18/17  2223     History   Chief Complaint Chief Complaint  Patient presents with  . Otalgia    HPI [name] is a 61 y.o. female.  61yo F w/ PMH including T2DM, colon CA, CVA who p/w R ear pain.  Patient began having severe, constant right ear pain tonight around 7 PM.  She reports recent cold symptoms including cough and nasal congestion.  No sore throat, dental pain, or fevers.  No medications prior to arrival.   The history is provided by the patient.  Otalgia     Past Medical History:  Diagnosis Date  . Colon cancer ([place])   . Diabetes mellitus   . Stroke [facility])     Patient Active Problem List   Diagnosis Date Noted  . Rectal bleeding 04/10/2012  . Supratherapeutic INR 04/10/2012  . Heme positive stool 04/10/2012  . Melena 11/07/2011  . Coagulopathy ([place]) 11/07/2011  . H/O colon cancer 11/07/2011  . OTHER ANAPHYLACTIC REACTION 06/20/2010  . ULCERATION OF INTESTINE 04/05/2010  . ANEMIA 04/02/2010  . SCABIES 07/06/2009  . UPPER RESPIRATORY INFECTION 07/06/2009  . PRURITUS, VAGINAL 01/25/2009  . URINARY INCONTINENCE 01/25/2009  . CANDIDIASIS, VAGINAL 11/06/2008  . EXTERNAL OTITIS 07/26/2008  . CHEST PAIN 05/05/2008  . EPISTAXIS 10/07/2007  . COLONIC POLYPS, HX OF 02/09/2007  . ANEMIA, B12 DEFICIENCY 01/19/2007  . SINUSITIS, ACUTE NOS 01/19/2007  . HYPERLIPIDEMIA 11/10/2006  . HYPERTENSION 11/10/2006  . Unspecified cerebral artery occlusion with cerebral infarction 06/02/2001  . PATENT FORAMEN OVALE 04/21/2001  . DIABETES MELLITUS, TYPE II, UNCONTROLLED 09/01/1999    Past Surgical History:  Procedure Laterality Date  . CHOLECYSTECTOMY    . COLON SURGERY    . ESOPHAGOGASTRODUODENOSCOPY (EGD) N/A 11/07/2011   Performed by Jerene Bears, MD at [facility]    OB History    No data available       Home Medications    Prior to  Admission medications   Medication Sig Start Date End Date Taking? Authorizing Provider  acetaminophen (TYLENOL) 500 MG tablet Take 1 tablet (500 mg total) by mouth every 6 (six) hours as needed. Patient not taking: Reported on 11/28/2016 08/13/14   Domenic Moras, PA-C  amoxicillin (AMOXIL) 500 MG capsule Take 1 capsule (500 mg total) 2 (two) times daily for 10 days by mouth. 04/18/17 04/28/17  Khaniyah Bezek, Wenda [facility], MD  aspirin EC 81 MG tablet Take 81 mg by mouth daily.    [provider]  cephALEXin (KEFLEX) 500 MG capsule Take 1 capsule (500 mg total) by mouth 4 (four) times daily. Patient not taking: Reported on 11/28/2016 11/10/16   Lysbeth Penner, FNP  ciprofloxacin (CIPRO) 500 MG tablet Take 1 tablet (500 mg total) by mouth 2 (two) times daily. 11/28/16   Rolland Porter, MD  EPINEPHrine 0.3 mg/0.3 mL IJ SOAJ injection Inject 0.3 mLs (0.3 mg total) into the muscle as needed (Tongue swelling, anaphylaxis). 08/16/14   Evelina Bucy, MD  famotidine (PEPCID) 20 MG tablet Take 1 tablet (20 mg total) by mouth 2 (two) times daily. Patient not taking: Reported on 11/28/2016 09/16/15   Palumbo, April, MD  ferrous sulfate 325 (65 FE) MG tablet Take 325 mg by mouth daily with breakfast.     [provider]  insulin aspart (NOVOLOG) 100 UNIT/ML injection Inject 15-20 Units into the skin 2 (two) times daily. Takes 20 units every night  and 15 units every morning    [provider]  insulin detemir (LEVEMIR) 100 UNIT/ML injection Inject 100 Units into the skin daily.    [provider]  omeprazole (PRILOSEC) 40 MG capsule Take 1 capsule (40 mg total) by mouth daily. Patient not taking: Reported on 11/28/2016 11/10/16   Lysbeth Penner, FNP  phenazopyridine (PYRIDIUM) 200 MG tablet Take 1 tablet (200 mg total) by mouth 3 (three) times daily. Patient not taking: Reported on 11/28/2016 11/10/16   Lysbeth Penner, FNP  sucralfate (CARAFATE) 1 GM/10ML suspension Take 10 mLs (1 g total)  by mouth 4 (four) times daily -  with meals and at bedtime. Patient not taking: Reported on 11/28/2016 09/16/15   Randal Buba, April, MD    Family History Family History  Problem Relation Age of Onset  . Cancer Other   . Diabetes Other     Social History Social History   Tobacco Use  . Smoking status: Never Smoker  . Smokeless tobacco: Never Used  Substance Use Topics  . Alcohol use: No  . Drug use: No     Allergies   Glipizide; Ibuprofen; Iohexol; Rosiglitazone-metformin; Hydrocodone-acetaminophen; Morphine; Pantoprazole; and Sulfamethoxazole   Review of Systems Review of Systems  HENT: Positive for ear pain.    All other systems reviewed and are negative except that which was mentioned in HPI   Physical Exam Updated Vital Signs BP (!) 170/94 (BP Location: Right Arm)   Pulse 96   Temp 98 F (36.7 C) (Oral)   Resp 18   Ht 5\' 4"  (1.626 m)   Wt 68 kg (150 lb)   SpO2 98%   BMI 25.75 kg/m   Physical Exam  Constitutional: She is oriented to person, place, and time. She appears well-developed and well-nourished. No distress.  tearful  HENT:  Head: Normocephalic and atraumatic.  Mouth/Throat: Oropharynx is clear and moist.  + nasal congestion; L TM normal in appearance; R TM with surrounding erythema, pus behind inferior portion of TM  Eyes: Conjunctivae are normal.  Neck: Neck supple.  Lymphadenopathy:    She has cervical adenopathy.  Neurological: She is alert and oriented to person, place, and time.  Skin: Skin is warm and dry.  Psychiatric:  Tearful, anxious  Nursing note and vitals reviewed.    ED Treatments / Results  Labs (all labs ordered are listed, but only abnormal results are displayed) Labs Reviewed - No data to display  EKG  EKG Interpretation None       Radiology No results found.  Procedures Procedures (including critical care time)  Medications Ordered in ED Medications  amoxicillin (AMOXIL) capsule 500 mg (not administered)    acetaminophen (TYLENOL) tablet 1,000 mg (not administered)     Initial Impression / Assessment and Plan / ED Course  I have reviewed the triage vital signs and the nursing notes.    Recent URI sx, now w/ R otalgia. Exam c/w AOM and given severe pain and h/o diabetes, will treat with antibiotic course. Discussed supportive measures, PCP f/u, and return precautions.   Final Clinical Impressions(s) / ED Diagnoses   Final diagnoses:  Acute suppurative otitis media of right ear without spontaneous rupture of tympanic membrane, recurrence not specified    ED Discharge Orders        Ordered    amoxicillin (AMOXIL) 500 MG capsule  2 times daily     04/18/17 2257       Genessa Beman, Wenda [place], MD 04/18/17 2310

## 2017-04-18 NOTE — Discharge Instructions (Signed)
Return to ER for fever, facial swelling, hearing loss, or severe worsening of symptoms.

## 2017-04-21 ENCOUNTER — Emergency Department (HOSPITAL_COMMUNITY)
Admission: EM | Admit: 2017-04-21 | Discharge: 2017-04-21 | Disposition: A | Discharge status: Home / Self Care | Payer: Medicare Other | Attending: Emergency Medicine | Admitting: Emergency Medicine

## 2017-04-21 ENCOUNTER — Encounter (HOSPITAL_COMMUNITY): Payer: Self-pay | Admitting: *Deleted

## 2017-04-21 DIAGNOSIS — Z794 Long term (current) use of insulin: Secondary | ICD-10-CM | POA: Insufficient documentation

## 2017-04-21 DIAGNOSIS — Z79899 Other long term (current) drug therapy: Secondary | ICD-10-CM | POA: Insufficient documentation

## 2017-04-21 DIAGNOSIS — E119 Type 2 diabetes mellitus without complications: Secondary | ICD-10-CM | POA: Insufficient documentation

## 2017-04-21 DIAGNOSIS — H6691 Otitis media, unspecified, right ear: Secondary | ICD-10-CM | POA: Insufficient documentation

## 2017-04-21 DIAGNOSIS — Z7982 Long term (current) use of aspirin: Secondary | ICD-10-CM | POA: Insufficient documentation

## 2017-04-21 DIAGNOSIS — I10 Essential (primary) hypertension: Secondary | ICD-10-CM | POA: Insufficient documentation

## 2017-04-21 MED ORDER — CLINDAMYCIN HCL 300 MG PO CAPS
300.0000 mg | ORAL_CAPSULE | Freq: Once | ORAL | Status: AC
  Administered 2017-04-21: 300 mg via ORAL
  Filled 2017-04-21: qty 1

## 2017-04-21 MED ORDER — CIPROFLOXACIN-DEXAMETHASONE 0.3-0.1 % OT SUSP: 4.0000 [drp] | Freq: Two times a day (BID) | OTIC | 0 refills | Status: DC

## 2017-04-21 MED ORDER — CLINDAMYCIN HCL 300 MG PO CAPS: 300.0000 mg | ORAL_CAPSULE | Freq: Four times a day (QID) | ORAL | 0 refills | Status: DC

## 2017-04-21 MED ORDER — LORATADINE 10 MG PO TABS
10.0000 mg | ORAL_TABLET | Freq: Once | ORAL | Status: AC
  Administered 2017-04-21: 10 mg via ORAL
  Filled 2017-04-21: qty 1

## 2017-04-21 MED ORDER — ACETAMINOPHEN 500 MG PO TABS
1000.0000 mg | ORAL_TABLET | Freq: Once | ORAL | Status: AC
  Administered 2017-04-21: 1000 mg via ORAL
  Filled 2017-04-21: qty 2

## 2017-04-21 NOTE — ED Provider Notes (Addendum)
[facility] DEPT Provider Note   CSN: [id number] Arrival date & time: 04/21/17  0251     History   Chief Complaint Chief Complaint  Patient presents with  . Facial Swelling    HPI [name] is a 61 y.o. female.  The history is provided by the patient.  Otalgia  This is a new problem. The current episode started more than 2 days ago. There is pain in the right ear. The problem occurs constantly. The problem has been gradually worsening. There has been no fever. The pain is severe. Pertinent negatives include no ear discharge, no rhinorrhea, no sore throat and no abdominal pain. Her past medical history does not include tympanostomy tube. Past medical history comments: hearing feels muffled.    Past Medical History:  Diagnosis Date  . Colon cancer ([place])   . Diabetes mellitus   . Stroke [facility])     Patient Active Problem List   Diagnosis Date Noted  . Rectal bleeding 04/10/2012  . Supratherapeutic INR 04/10/2012  . Heme positive stool 04/10/2012  . Melena 11/07/2011  . Coagulopathy ([place]) 11/07/2011  . H/O colon cancer 11/07/2011  . OTHER ANAPHYLACTIC REACTION 06/20/2010  . ULCERATION OF INTESTINE 04/05/2010  . ANEMIA 04/02/2010  . SCABIES 07/06/2009  . UPPER RESPIRATORY INFECTION 07/06/2009  . PRURITUS, VAGINAL 01/25/2009  . URINARY INCONTINENCE 01/25/2009  . CANDIDIASIS, VAGINAL 11/06/2008  . EXTERNAL OTITIS 07/26/2008  . CHEST PAIN 05/05/2008  . EPISTAXIS 10/07/2007  . COLONIC POLYPS, HX OF 02/09/2007  . ANEMIA, B12 DEFICIENCY 01/19/2007  . SINUSITIS, ACUTE NOS 01/19/2007  . HYPERLIPIDEMIA 11/10/2006  . HYPERTENSION 11/10/2006  . Unspecified cerebral artery occlusion with cerebral infarction 06/02/2001  . PATENT FORAMEN OVALE 04/21/2001  . DIABETES MELLITUS, TYPE II, UNCONTROLLED 09/01/1999    Past Surgical History:  Procedure Laterality Date  . CHOLECYSTECTOMY    . COLON SURGERY    . ESOPHAGOGASTRODUODENOSCOPY  (EGD) N/A 11/07/2011   Performed by Jerene Bears, MD at [facility]    OB History    No data available       Home Medications    Prior to Admission medications   Medication Sig Start Date End Date Taking? Authorizing Provider  acetaminophen (TYLENOL) 500 MG tablet Take 1 tablet (500 mg total) by mouth every 6 (six) hours as needed. Patient not taking: Reported on 11/28/2016 08/13/14   Domenic Moras, PA-C  amoxicillin (AMOXIL) 500 MG capsule Take 1 capsule (500 mg total) 2 (two) times daily for 10 days by mouth. 04/18/17 04/28/17  Little, Wenda [facility], MD  aspirin EC 81 MG tablet Take 81 mg by mouth daily.    [provider]  cephALEXin (KEFLEX) 500 MG capsule Take 1 capsule (500 mg total) by mouth 4 (four) times daily. Patient not taking: Reported on 11/28/2016 11/10/16   Lysbeth Penner, FNP  ciprofloxacin (CIPRO) 500 MG tablet Take 1 tablet (500 mg total) by mouth 2 (two) times daily. 11/28/16   Rolland Porter, MD  EPINEPHrine 0.3 mg/0.3 mL IJ SOAJ injection Inject 0.3 mLs (0.3 mg total) into the muscle as needed (Tongue swelling, anaphylaxis). 08/16/14   Evelina Bucy, MD  famotidine (PEPCID) 20 MG tablet Take 1 tablet (20 mg total) by mouth 2 (two) times daily. Patient not taking: Reported on 11/28/2016 09/16/15   Alfredo Spong, MD  ferrous sulfate 325 (65 FE) MG tablet Take 325 mg by mouth daily with breakfast.     [provider]  insulin aspart (NOVOLOG)  100 UNIT/ML injection Inject 15-20 Units into the skin 2 (two) times daily. Takes 20 units every night and 15 units every morning    [provider]  insulin detemir (LEVEMIR) 100 UNIT/ML injection Inject 100 Units into the skin daily.    [provider]  omeprazole (PRILOSEC) 40 MG capsule Take 1 capsule (40 mg total) by mouth daily. Patient not taking: Reported on 11/28/2016 11/10/16   Lysbeth Penner, FNP  phenazopyridine (PYRIDIUM) 200 MG tablet Take 1 tablet (200 mg total) by mouth 3 (three) times  daily. Patient not taking: Reported on 11/28/2016 11/10/16   Lysbeth Penner, FNP  sucralfate (CARAFATE) 1 GM/10ML suspension Take 10 mLs (1 g total) by mouth 4 (four) times daily -  with meals and at bedtime. Patient not taking: Reported on 11/28/2016 09/16/15   Randal Buba, Angelena Sand, MD    Family History Family History  Problem Relation Age of Onset  . Cancer Other   . Diabetes Other     Social History Social History   Tobacco Use  . Smoking status: Never Smoker  . Smokeless tobacco: Never Used  Substance Use Topics  . Alcohol use: No  . Drug use: No     Allergies   Glipizide; Ibuprofen; Iohexol; Rosiglitazone-metformin; Hydrocodone-acetaminophen; Morphine; Pantoprazole; and Sulfamethoxazole   Review of Systems Review of Systems  HENT: Positive for ear pain. Negative for ear discharge, rhinorrhea, sinus pressure, sinus pain, sore throat, trouble swallowing and voice change.        States face feels swollen but is not swollen  Gastrointestinal: Negative for abdominal pain.  All other systems reviewed and are negative.    Physical Exam Updated Vital Signs BP 136/70   Pulse 85   Temp 98.2 F (36.8 C) (Oral)   Resp 16   SpO2 97%   Physical Exam  Constitutional: She is oriented to person, place, and time. She appears well-developed and well-nourished. No distress.  HENT:  Head: Normocephalic and atraumatic.  Right Ear: External ear normal. No lacerations. No drainage. No foreign bodies. No mastoid tenderness. Tympanic membrane is erythematous. Tympanic membrane is not injected, not perforated, not retracted and not bulging. No hemotympanum.  Left Ear: External ear normal. No lacerations. No drainage. No foreign bodies. No mastoid tenderness. Tympanic membrane is not injected, not perforated, not erythematous, not retracted and not bulging. No hemotympanum.  Mouth/Throat: Oropharynx is clear and moist. No trismus in the jaw. Dental caries present. No dental abscesses or uvula  swelling. No oropharyngeal exudate.  No swelling in the airway, no submandibular swelling.  No swelling of the lips tongue or uvula.  No trismus intact phonation  Eyes: Conjunctivae and EOM are normal. Pupils are equal, round, and reactive to light.  No proptosis intact cognition  Neck: Normal range of motion. Neck supple. No tracheal deviation present.  No pain with displacement of the trachea, no LAN   Cardiovascular: Normal rate, regular rhythm, normal heart sounds and intact distal pulses.  Pulmonary/Chest: Effort normal and breath sounds normal. No stridor. She has no wheezes. She has no rales.  Abdominal: Soft. Bowel sounds are normal. She exhibits no mass. There is no tenderness. There is no rebound and no guarding.  Musculoskeletal: Normal range of motion.  Lymphadenopathy:    She has no cervical adenopathy.  Neurological: She is alert and oriented to person, place, and time. She displays normal reflexes.  Skin: Skin is warm and dry. Capillary refill takes less than 2 seconds.  Psychiatric: She has a  normal mood and affect.     ED Treatments / Results   Vitals:   04/21/17 0303  BP: 136/70  Pulse: 85  Resp: 16  Temp: 98.2 F (36.8 C)  SpO2: 97%    Procedures Procedures (including critical care time)  Medications Ordered in ED Medications  clindamycin (CLEOCIN) capsule 300 mg (not administered)  loratadine (CLARITIN) tablet 10 mg (not administered)  acetaminophen (TYLENOL) tablet 1,000 mg (not administered)      Final Clinical Impressions(s) / ED Diagnoses   Final diagnoses:  Otitis media follow-up, not resolved, right   Patient states she is getting worse despite antibiotics started on Saturday. There is no swelling of the face, face is symmetric.  No swelling or LAN of the neck.  Intact phonation.  There is no trismus.  Airway is widely patent.  Mallempati class 1. Patient has a reassuring exam and vitals signs.  Given DM history and reported h/o of getting  worse will change patient to clindamycin to increase coverage and start antibiotic ear drops as patient is not currently taking these. Stop amoxicillin. Start clindamycin and ear drops this morning. I have instructed the patient to follow up with ENT doctor in order to further investigate this problem.  Call today to schedule this appointment.  Patient verbalizes understanding of all these instructions and agrees to follow up with the ENT specialist as directed.  Also follow up with your PMD for recheck in 2 days.    All questions answered to the patient's satisfaction.    Strict return precautions for fever, inability to open the mouth, inability to speech, swelling behind the ear or in the neck, fevers,  global weakness, vomiting, swelling or the lips or tongue, chest pain, dyspnea on exertion,shortness of breath, persistent pain, Inability to tolerate liquids or food, cough, altered mental status or any concerns. No signs of systemic illness or infection. The patient is nontoxic-appearing on exam and vital signs are within normal limits.    I have reviewed the triage vital signs and the nursing notes. Pertinent labs &imaging results that were available during my care of the patient were reviewed by me and considered in my medical decision making (see chart for details).  After history, exam, and medical workup I feel the patient has been appropriately medically screened and is safe for discharge home. Pertinent diagnoses were discussed with the patient. Patient was given return precautions   Dalis Beers, MD 04/21/17 [place], Keilly Fatula, MD 04/21/17 [place], [place], MD 04/21/17 [id number]

## 2017-04-21 NOTE — ED Triage Notes (Signed)
Pt says that she feels like her right tongue and right face since last night, some pain in the area. Pt says she is using her ear drops but the pain is not improving.

## 2017-12-17 ENCOUNTER — Encounter (HOSPITAL_COMMUNITY): Payer: Self-pay | Admitting: Emergency Medicine

## 2017-12-17 ENCOUNTER — Ambulatory Visit (HOSPITAL_COMMUNITY)
Admission: EM | Admit: 2017-12-17 | Discharge: 2017-12-17 | Disposition: A | Discharge status: Home / Self Care | Payer: Self-pay | Attending: Family Medicine | Admitting: Family Medicine

## 2017-12-17 DIAGNOSIS — Z79899 Other long term (current) drug therapy: Secondary | ICD-10-CM | POA: Insufficient documentation

## 2017-12-17 DIAGNOSIS — Z886 Allergy status to analgesic agent status: Secondary | ICD-10-CM | POA: Insufficient documentation

## 2017-12-17 DIAGNOSIS — Z8673 Personal history of transient ischemic attack (TIA), and cerebral infarction without residual deficits: Secondary | ICD-10-CM | POA: Insufficient documentation

## 2017-12-17 DIAGNOSIS — Z794 Long term (current) use of insulin: Secondary | ICD-10-CM | POA: Insufficient documentation

## 2017-12-17 DIAGNOSIS — Z882 Allergy status to sulfonamides status: Secondary | ICD-10-CM | POA: Insufficient documentation

## 2017-12-17 DIAGNOSIS — Z885 Allergy status to narcotic agent status: Secondary | ICD-10-CM | POA: Insufficient documentation

## 2017-12-17 DIAGNOSIS — Z9049 Acquired absence of other specified parts of digestive tract: Secondary | ICD-10-CM | POA: Insufficient documentation

## 2017-12-17 DIAGNOSIS — N898 Other specified noninflammatory disorders of vagina: Secondary | ICD-10-CM | POA: Insufficient documentation

## 2017-12-17 DIAGNOSIS — Z85038 Personal history of other malignant neoplasm of large intestine: Secondary | ICD-10-CM | POA: Insufficient documentation

## 2017-12-17 DIAGNOSIS — I10 Essential (primary) hypertension: Secondary | ICD-10-CM | POA: Insufficient documentation

## 2017-12-17 DIAGNOSIS — M62830 Muscle spasm of back: Secondary | ICD-10-CM | POA: Insufficient documentation

## 2017-12-17 DIAGNOSIS — E119 Type 2 diabetes mellitus without complications: Secondary | ICD-10-CM | POA: Insufficient documentation

## 2017-12-17 DIAGNOSIS — Z7982 Long term (current) use of aspirin: Secondary | ICD-10-CM | POA: Insufficient documentation

## 2017-12-17 DIAGNOSIS — R300 Dysuria: Secondary | ICD-10-CM | POA: Insufficient documentation

## 2017-12-17 DIAGNOSIS — E785 Hyperlipidemia, unspecified: Secondary | ICD-10-CM | POA: Insufficient documentation

## 2017-12-17 LAB — POCT URINALYSIS DIP (DEVICE)
BILIRUBIN URINE: NEGATIVE
Glucose, UA: 500 mg/dL — AB
Hgb urine dipstick: NEGATIVE
KETONES UR: 15 mg/dL — AB
Leukocytes, UA: NEGATIVE
Nitrite: POSITIVE — AB
PH: 6 (ref 5.0–8.0)
PROTEIN: NEGATIVE mg/dL
SPECIFIC GRAVITY, URINE: 1.015 (ref 1.005–1.030)
Urobilinogen, UA: 0.2 mg/dL (ref 0.0–1.0)

## 2017-12-17 MED ORDER — FLUCONAZOLE 150 MG PO TABS: 150.0000 mg | ORAL_TABLET | Freq: Every day | ORAL | 0 refills | Status: AC

## 2017-12-17 MED ORDER — NITROFURANTOIN MONOHYD MACRO 100 MG PO CAPS: 100.0000 mg | ORAL_CAPSULE | Freq: Two times a day (BID) | ORAL | 0 refills | Status: DC

## 2017-12-17 MED ORDER — METHOCARBAMOL 500 MG PO TABS: 500.0000 mg | ORAL_TABLET | Freq: Two times a day (BID) | ORAL | 0 refills | Status: DC

## 2017-12-17 NOTE — Discharge Instructions (Signed)
It was nice meeting you!!  I am giving you a muscle relaxer to take as needed for muscle spasm.  Be aware this may make you drowsy. Don't drive on this medication.  You can also use heat and continue the tylenol for pain.  Your urine was positive for urinary tract infection we will treat you with Macrobid for 5 days. Make sure you are staying hydrated and drinking plenty of water until your urine is clear. We are also giving you some Diflucan for yeast infection. We will send your urine for cultures and call you with results.

## 2017-12-17 NOTE — ED Triage Notes (Signed)
Pt sts upper back pain and vaginal itching

## 2017-12-17 NOTE — ED Provider Notes (Signed)
[place]    CSN: [id number] Arrival date & time: 12/17/17  1444     History   Chief Complaint Chief Complaint  Patient presents with  . Back Pain  . Vaginal Itching    HPI [name] is a 62 y.o. female.   Patient is a 62 year old female with past medical history of colon cancer, diabetes, stroke.  She presents with 1 week of upper back pain.  The pain is on the right side.  The pain is been constant.  She has been using Tylenol with some relief of pain.  She denies any injury to the back or heavy lifting.  She denies any cough, congestion, fevers, chills.  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   She is also complaining of vaginal itching, irritation, burning.  She is unsure about the discharge because she wears a pad every day.  This is been constant for the past 3 days.  She also reports some dysuria.  She denies any frequency, hematuria, abdominal pain, pelvic pain, lower back pain.   ROS per HPI      Past Medical History:  Diagnosis Date  . Colon cancer ([place])   . Diabetes mellitus   . Stroke [facility])     Patient Active Problem List   Diagnosis Date Noted  . Rectal bleeding 04/10/2012  . Supratherapeutic INR 04/10/2012  . Heme positive stool 04/10/2012  . Melena 11/07/2011  . Coagulopathy ([place]) 11/07/2011  . H/O colon cancer 11/07/2011  . OTHER ANAPHYLACTIC REACTION 06/20/2010  . ULCERATION OF INTESTINE 04/05/2010  . ANEMIA 04/02/2010  . SCABIES 07/06/2009  . UPPER RESPIRATORY INFECTION 07/06/2009  . PRURITUS, VAGINAL 01/25/2009  . URINARY INCONTINENCE 01/25/2009  . CANDIDIASIS, VAGINAL 11/06/2008  . EXTERNAL OTITIS 07/26/2008  . CHEST PAIN 05/05/2008  . EPISTAXIS 10/07/2007  . COLONIC POLYPS, HX OF 02/09/2007  . ANEMIA, B12 DEFICIENCY 01/19/2007  . SINUSITIS, ACUTE NOS 01/19/2007  . HYPERLIPIDEMIA 11/10/2006  . HYPERTENSION 11/10/2006  . Unspecified cerebral artery occlusion with cerebral  infarction 06/02/2001  . PATENT FORAMEN OVALE 04/21/2001  . DIABETES MELLITUS, TYPE II, UNCONTROLLED 09/01/1999    Past Surgical History:  Procedure Laterality Date  . CHOLECYSTECTOMY    . COLON SURGERY    . ESOPHAGOGASTRODUODENOSCOPY  11/07/2011   Procedure: ESOPHAGOGASTRODUODENOSCOPY (EGD);  Surgeon: Jerene Bears, MD;  Location: [facility];  Service: Gastroenterology;  Laterality: N/A;    OB History   None      Home Medications    Prior to Admission medications   Medication Sig Start Date End Date Taking? Authorizing Provider  acetaminophen (TYLENOL) 500 MG tablet Take 1 tablet (500 mg total) by mouth every 6 (six) hours as needed. Patient not taking: Reported on 11/28/2016 08/13/14   Domenic Moras, PA-C  aspirin EC 81 MG tablet Take 81 mg by mouth daily.    [provider]  cephALEXin (KEFLEX) 500 MG capsule Take 1 capsule (500 mg total) by mouth 4 (four) times daily. Patient not taking: Reported on 11/28/2016 11/10/16   Lysbeth Penner, FNP  ciprofloxacin (CIPRO) 500 MG tablet Take 1 tablet (500 mg total) by mouth 2 (two) times daily. 11/28/16   Rolland Porter, MD  ciprofloxacin-dexamethasone (CIPRODEX) OTIC suspension Place 4 drops 2 (two) times daily into the right ear. 04/21/17   Palumbo, April, MD  clindamycin (CLEOCIN) 300 MG capsule Take 1 capsule (300 mg total) 4 (four) times daily by mouth. X  7 days 04/21/17   Palumbo, April, MD  EPINEPHrine 0.3 mg/0.3 mL IJ SOAJ injection Inject 0.3 mLs (0.3 mg total) into the muscle as needed (Tongue swelling, anaphylaxis). 08/16/14   Evelina Bucy, MD  famotidine (PEPCID) 20 MG tablet Take 1 tablet (20 mg total) by mouth 2 (two) times daily. Patient not taking: Reported on 11/28/2016 09/16/15   Palumbo, April, MD  ferrous sulfate 325 (65 FE) MG tablet Take 325 mg by mouth daily with breakfast.     [provider]  fluconazole (DIFLUCAN) 150 MG tablet Take 1 tablet (150 mg total) by mouth daily for 1 day. 12/17/17 12/18/17  Loura Halt A, NP  insulin aspart (NOVOLOG) 100 UNIT/ML injection Inject 15-20 Units into the skin 2 (two) times daily. Takes 20 units every night and 15 units every morning    [provider]  insulin detemir (LEVEMIR) 100 UNIT/ML injection Inject 100 Units into the skin daily.    [provider]  methocarbamol (ROBAXIN) 500 MG tablet Take 1 tablet (500 mg total) by mouth 2 (two) times daily. 12/17/17   Loura Halt A, NP  nitrofurantoin, macrocrystal-monohydrate, (MACROBID) 100 MG capsule Take 1 capsule (100 mg total) by mouth 2 (two) times daily. 12/17/17   Loura Halt A, NP  omeprazole (PRILOSEC) 40 MG capsule Take 1 capsule (40 mg total) by mouth daily. Patient not taking: Reported on 11/28/2016 11/10/16   Lysbeth Penner, FNP  phenazopyridine (PYRIDIUM) 200 MG tablet Take 1 tablet (200 mg total) by mouth 3 (three) times daily. Patient not taking: Reported on 11/28/2016 11/10/16   Lysbeth Penner, FNP  sucralfate (CARAFATE) 1 GM/10ML suspension Take 10 mLs (1 g total) by mouth 4 (four) times daily -  with meals and at bedtime. Patient not taking: Reported on 11/28/2016 09/16/15   Randal Buba, April, MD    Family History Family History  Problem Relation Age of Onset  . Cancer Other   . Diabetes Other     Social History Social History   Tobacco Use  . Smoking status: Never Smoker  . Smokeless tobacco: Never Used  Substance Use Topics  . Alcohol use: No  . Drug use: No     Allergies   Glipizide; Ibuprofen; Iohexol; Rosiglitazone-metformin; Hydrocodone-acetaminophen; Morphine; Pantoprazole; and Sulfamethoxazole   Review of Systems Review of Systems   Physical Exam Triage Vital Signs ED Triage Vitals [12/17/17 1519]  Enc Vitals Group     BP 121/64     Pulse Rate 85     Resp 18     Temp 98 F (36.7 C)     Temp Source Oral     SpO2 97 %     Weight      Height      Head Circumference      Peak Flow      Pain Score      Pain Loc      Pain Edu?      Excl. in  [place]?    No data found.  Updated Vital Signs BP 121/64 (BP Location: Left Arm)   Pulse 85   Temp 98 F (36.7 C) (Oral)   Resp 18   SpO2 97%   Visual Acuity Right Eye Distance:   Left Eye Distance:   Bilateral Distance:    Right Eye Near:   Left Eye Near:    Bilateral Near:     Physical Exam  Constitutional: She is oriented to person, place, and time. She appears well-developed and  well-nourished.  Cardiovascular: Normal rate and regular rhythm.  Pulmonary/Chest: Effort normal and breath sounds normal.  Musculoskeletal:  Muscle spasm to right thoracic paravertebral muscle.  No deformity, erythema, ecchymosis.  Neurological: She is alert and oriented to person, place, and time.  Skin: Skin is warm and dry. Capillary refill takes less than 2 seconds.  Psychiatric: She has a normal mood and affect.  Nursing note and vitals reviewed.    UC Treatments / Results  Labs (all labs ordered are listed, but only abnormal results are displayed) Labs Reviewed  POCT URINALYSIS DIP (DEVICE) - Abnormal; Notable for the following components:      Result Value   Glucose, UA 500 (*)    Ketones, ur 15 (*)    Nitrite POSITIVE (*)    All other components within normal limits  URINE CYTOLOGY ANCILLARY ONLY    EKG None  Radiology No results found.  Procedures Procedures (including critical care time)  Medications Ordered in UC Medications - No data to display  Initial Impression / Assessment and Plan / UC Course  I have reviewed the triage vital signs and the nursing notes.  Pertinent labs & imaging results that were available during my care of the patient were reviewed by me and considered in my medical decision making (see chart for details).     Urine positive for UTI will treat with antibiotics.  We will also treat for yeast infection based on symptoms.  Robaxin for muscle relaxer to treat muscle spasm.  Patient reports allergy to NSAIDs so will not treat with that.  She may  continue Tylenol for pain.  Also heat to the area.  Precautions given about Robaxin.  Follow-up as needed. Final Clinical Impressions(s) / UC Diagnoses   Final diagnoses:  Muscle spasm of back     Discharge Instructions     It was nice meeting you!!  I am giving you a muscle relaxer to take as needed for muscle spasm.  Be aware this may make you drowsy. Don't drive on this medication.  You can also use heat and continue the tylenol for pain.  Your urine was positive for urinary tract infection we will treat you with Macrobid for 5 days. Make sure you are staying hydrated and drinking plenty of water until your urine is clear. We are also giving you some Diflucan for yeast infection. We will send your urine for cultures and call you with results.    ED Prescriptions    Medication Sig Dispense Auth. Provider   methocarbamol (ROBAXIN) 500 MG tablet Take 1 tablet (500 mg total) by mouth 2 (two) times daily. 20 tablet Granvil Djordjevic A, NP   nitrofurantoin, macrocrystal-monohydrate, (MACROBID) 100 MG capsule Take 1 capsule (100 mg total) by mouth 2 (two) times daily. 10 capsule Chantz Montefusco A, NP   fluconazole (DIFLUCAN) 150 MG tablet Take 1 tablet (150 mg total) by mouth daily for 1 day. 1 tablet Loura Halt A, NP     Controlled Substance Prescriptions [name] Controlled Substance Registry consulted? Not Applicable   Orvan July, NP 12/17/17 1646

## 2017-12-21 LAB — URINE CYTOLOGY ANCILLARY ONLY
Bacterial vaginitis: NEGATIVE
CANDIDA VAGINITIS: NEGATIVE

## 2018-05-29 ENCOUNTER — Emergency Department (HOSPITAL_BASED_OUTPATIENT_CLINIC_OR_DEPARTMENT_OTHER): Payer: Self-pay

## 2018-05-29 ENCOUNTER — Emergency Department (HOSPITAL_BASED_OUTPATIENT_CLINIC_OR_DEPARTMENT_OTHER)
Admission: EM | Admit: 2018-05-29 | Discharge: 2018-05-29 | Disposition: A | Discharge status: Home / Self Care | Payer: Self-pay | Attending: Emergency Medicine | Admitting: Emergency Medicine

## 2018-05-29 ENCOUNTER — Other Ambulatory Visit: Payer: Self-pay

## 2018-05-29 ENCOUNTER — Encounter (HOSPITAL_BASED_OUTPATIENT_CLINIC_OR_DEPARTMENT_OTHER): Payer: Self-pay | Admitting: *Deleted

## 2018-05-29 DIAGNOSIS — Y998 Other external cause status: Secondary | ICD-10-CM | POA: Insufficient documentation

## 2018-05-29 DIAGNOSIS — S62347A Nondisplaced fracture of base of fifth metacarpal bone. left hand, initial encounter for closed fracture: Secondary | ICD-10-CM | POA: Insufficient documentation

## 2018-05-29 DIAGNOSIS — E119 Type 2 diabetes mellitus without complications: Secondary | ICD-10-CM | POA: Insufficient documentation

## 2018-05-29 DIAGNOSIS — Y9389 Activity, other specified: Secondary | ICD-10-CM | POA: Insufficient documentation

## 2018-05-29 DIAGNOSIS — W010XXA Fall on same level from slipping, tripping and stumbling without subsequent striking against object, initial encounter: Secondary | ICD-10-CM | POA: Insufficient documentation

## 2018-05-29 DIAGNOSIS — Y92009 Unspecified place in unspecified non-institutional (private) residence as the place of occurrence of the external cause: Secondary | ICD-10-CM | POA: Insufficient documentation

## 2018-05-29 NOTE — ED Provider Notes (Signed)
[facility] EMERGENCY DEPARTMENT Provider Note   CSN: [id number] Arrival date & time: 05/29/18  1409     History   Chief Complaint Chief Complaint  Patient presents with  . Fall    HPI [name] [name] is a 62 y.o. female.  The history is provided by the patient and medical records. No language interpreter was used.  [name] is a 62 y.o. female who presents to the Emergency Department complaining of fall. She presents to the emergency department for evaluation of a hand injury that occurred during a fall on Thursday. She states that she slipped and put her hand forward and fell on the left hand. She reports pain to the lateral aspect of the hand as well as the digits. She is right-hand dominant. She denies any additional injuries from the fall. She denies any recent illnesses. She has a history of diabetes. Times are mild to moderate and constant nature. Past Medical History:  Diagnosis Date  . Colon cancer ([place])   . Diabetes mellitus   . Stroke [facility])     Patient Active Problem List   Diagnosis Date Noted  . Rectal bleeding 04/10/2012  . Supratherapeutic INR 04/10/2012  . Heme positive stool 04/10/2012  . Melena 11/07/2011  . Coagulopathy ([place]) 11/07/2011  . H/O colon cancer 11/07/2011  . OTHER ANAPHYLACTIC REACTION 06/20/2010  . ULCERATION OF INTESTINE 04/05/2010  . ANEMIA 04/02/2010  . SCABIES 07/06/2009  . UPPER RESPIRATORY INFECTION 07/06/2009  . PRURITUS, VAGINAL 01/25/2009  . URINARY INCONTINENCE 01/25/2009  . CANDIDIASIS, VAGINAL 11/06/2008  . EXTERNAL OTITIS 07/26/2008  . CHEST PAIN 05/05/2008  . EPISTAXIS 10/07/2007  . COLONIC POLYPS, HX OF 02/09/2007  . ANEMIA, B12 DEFICIENCY 01/19/2007  . SINUSITIS, ACUTE NOS 01/19/2007  . HYPERLIPIDEMIA 11/10/2006  . HYPERTENSION 11/10/2006  . Unspecified cerebral artery occlusion with cerebral infarction 06/02/2001  . PATENT FORAMEN OVALE 04/21/2001  . DIABETES MELLITUS,  TYPE II, UNCONTROLLED 09/01/1999    Past Surgical History:  Procedure Laterality Date  . CHOLECYSTECTOMY    . COLON SURGERY    . ESOPHAGOGASTRODUODENOSCOPY  11/07/2011   Procedure: ESOPHAGOGASTRODUODENOSCOPY (EGD);  Surgeon: Jerene Bears, MD;  Location: [facility];  Service: Gastroenterology;  Laterality: N/A;     OB History   No obstetric history on file.      Home Medications    Prior to Admission medications   Medication Sig Start Date End Date Taking? Authorizing Provider  acetaminophen (TYLENOL) 500 MG tablet Take 1 tablet (500 mg total) by mouth every 6 (six) hours as needed. Patient not taking: Reported on 11/28/2016 08/13/14   Domenic Moras, PA-C  aspirin EC 81 MG tablet Take 81 mg by mouth daily.    [provider]  cephALEXin (KEFLEX) 500 MG capsule Take 1 capsule (500 mg total) by mouth 4 (four) times daily. Patient not taking: Reported on 11/28/2016 11/10/16   Lysbeth Penner, FNP  ciprofloxacin (CIPRO) 500 MG tablet Take 1 tablet (500 mg total) by mouth 2 (two) times daily. 11/28/16   Rolland Porter, MD  ciprofloxacin-dexamethasone (CIPRODEX) OTIC suspension Place 4 drops 2 (two) times daily into the right ear. 04/21/17   Palumbo, April, MD  clindamycin (CLEOCIN) 300 MG capsule Take 1 capsule (300 mg total) 4 (four) times daily by mouth. X 7 days 04/21/17   Palumbo, April, MD  EPINEPHrine 0.3 mg/0.3 mL IJ SOAJ injection Inject 0.3 mLs (0.3 mg total) into the muscle as needed (Tongue  swelling, anaphylaxis). 08/16/14   Evelina Bucy, MD  famotidine (PEPCID) 20 MG tablet Take 1 tablet (20 mg total) by mouth 2 (two) times daily. Patient not taking: Reported on 11/28/2016 09/16/15   Palumbo, April, MD  ferrous sulfate 325 (65 FE) MG tablet Take 325 mg by mouth daily with breakfast.     [provider]  insulin aspart (NOVOLOG) 100 UNIT/ML injection Inject 15-20 Units into the skin 2 (two) times daily. Takes 20 units every night and 15 units every morning    [provider]  insulin detemir (LEVEMIR) 100 UNIT/ML injection Inject 100 Units into the skin daily.    [provider]  methocarbamol (ROBAXIN) 500 MG tablet Take 1 tablet (500 mg total) by mouth 2 (two) times daily. 12/17/17   Loura Halt A, NP  nitrofurantoin, macrocrystal-monohydrate, (MACROBID) 100 MG capsule Take 1 capsule (100 mg total) by mouth 2 (two) times daily. 12/17/17   Loura Halt A, NP  omeprazole (PRILOSEC) 40 MG capsule Take 1 capsule (40 mg total) by mouth daily. Patient not taking: Reported on 11/28/2016 11/10/16   Lysbeth Penner, FNP  phenazopyridine (PYRIDIUM) 200 MG tablet Take 1 tablet (200 mg total) by mouth 3 (three) times daily. Patient not taking: Reported on 11/28/2016 11/10/16   Lysbeth Penner, FNP  sucralfate (CARAFATE) 1 GM/10ML suspension Take 10 mLs (1 g total) by mouth 4 (four) times daily -  with meals and at bedtime. Patient not taking: Reported on 11/28/2016 09/16/15   Randal Buba, April, MD    Family History Family History  Problem Relation Age of Onset  . Cancer Other   . Diabetes Other     Social History Social History   Tobacco Use  . Smoking status: Never Smoker  . Smokeless tobacco: Never Used  Substance Use Topics  . Alcohol use: No  . Drug use: No     Allergies   Glipizide; Ibuprofen; Iohexol; Rosiglitazone-metformin; Hydrocodone-acetaminophen; Morphine; Pantoprazole; and Sulfamethoxazole   Review of Systems Review of Systems  All other systems reviewed and are negative.    Physical Exam Updated Vital Signs BP 128/76 (BP Location: Right Arm)   Pulse 76   Temp 98.1 F (36.7 C) (Oral)   Resp 16   Ht 5\' 4"  (1.626 m)   Wt 68 kg   SpO2 98%   BMI 25.75 kg/m   Physical Exam Vitals signs and nursing note reviewed.  Constitutional:      Appearance: Normal appearance.  HENT:     Head: Normocephalic and atraumatic.  Neck:     Musculoskeletal: Neck supple.  Cardiovascular:     Rate and Rhythm: Normal rate and regular  rhythm.  Pulmonary:     Effort: Pulmonary effort is normal. No respiratory distress.  Musculoskeletal:     Comments: Swelling and ecchymosis to the left ulnar hand on the volar and dorsal surfaces.  There is ttp throughout the ulnar aspect of hand as well as fourth and fifth MCP joints.  No wrist tenderness.  Flexion/extension intact throughout digits against resistance.  Sensation to light touch intact throughout the hand.  2+ radial pulses bilaterally.  Skin:    General: Skin is warm and dry.     Capillary Refill: Capillary refill takes less than 2 seconds.  Neurological:     Mental Status: She is alert and oriented to person, place, and time.  Psychiatric:        Mood and Affect: Mood normal.  Behavior: Behavior normal.      ED Treatments / Results  Labs (all labs ordered are listed, but only abnormal results are displayed) Labs Reviewed - No data to display  EKG None  Radiology Dg Hand Complete Left  Result Date: 05/29/2018 CLINICAL DATA:  Left hand pain and swelling about the fifth metacarpal after fall 2 days ago. EXAM: LEFT HAND - COMPLETE 3+ VIEW COMPARISON:  Wrist radiographs 05/01/2016 FINDINGS: Acute volar and radial angulated proximal diaphyseal fracture of the left fifth metacarpal with overlying soft tissue swelling is noted. Carpal rows are maintained. The bones are demineralized. No joint dislocation. No intra-articular extension of fracture is visualized. IMPRESSION: Acute, closed, volar and radial angulated proximal diaphyseal fracture of the left fifth metacarpal. Electronically Signed   By: Ashley Royalty M.D.   On: 05/29/2018 14:39    Procedures Procedures (including critical care time) SPLINT APPLICATION Date/Time: 9:47 PM Authorized by: Quintella Reichert Consent: Verbal consent obtained. Risks and benefits: risks, benefits and alternatives were discussed Consent given by: patient Splint applied by: ED technician Location details: LUE Splint type: ulnar  gutter Supplies used: orthoglass Post-procedure: The splinted body part was neurovascularly unchanged following the procedure. Patient tolerance: Patient tolerated the procedure well with no immediate complications.    Medications Ordered in ED Medications - No data to display   Initial Impression / Assessment and Plan / ED Course  I have reviewed the triage vital signs and the nursing notes.  Pertinent labs & imaging results that were available during my care of the patient were reviewed by me and considered in my medical decision making (see chart for details).     Pt here for evaluation of hand pain following a fall two days ago. She does have fifth metacarpal fracture, NVI on exam.  Plan to place in ulnar gutter splint with hand surgery follow up.   Final Clinical Impressions(s) / ED Diagnoses   Final diagnoses:  None    ED Discharge Orders    None       Quintella Reichert, MD 05/29/18 1756

## 2018-05-29 NOTE — ED Triage Notes (Signed)
Left hand swelling since falling 2 days ago. Trip and fall.

## 2018-05-29 NOTE — ED Notes (Signed)
Pt given peanut butter crackers and water. 

## 2018-10-19 ENCOUNTER — Encounter (HOSPITAL_BASED_OUTPATIENT_CLINIC_OR_DEPARTMENT_OTHER): Payer: Self-pay | Admitting: Emergency Medicine

## 2018-10-19 ENCOUNTER — Emergency Department (HOSPITAL_BASED_OUTPATIENT_CLINIC_OR_DEPARTMENT_OTHER): Payer: Medicare Other

## 2018-10-19 ENCOUNTER — Other Ambulatory Visit: Payer: Self-pay

## 2018-10-19 ENCOUNTER — Emergency Department (HOSPITAL_BASED_OUTPATIENT_CLINIC_OR_DEPARTMENT_OTHER)
Admission: EM | Admit: 2018-10-19 | Discharge: 2018-10-19 | Disposition: A | Discharge status: Home / Self Care | Payer: Medicare Other | Attending: Emergency Medicine | Admitting: Emergency Medicine

## 2018-10-19 DIAGNOSIS — Z8673 Personal history of transient ischemic attack (TIA), and cerebral infarction without residual deficits: Secondary | ICD-10-CM | POA: Insufficient documentation

## 2018-10-19 DIAGNOSIS — Y939 Activity, unspecified: Secondary | ICD-10-CM | POA: Diagnosis not present

## 2018-10-19 DIAGNOSIS — E119 Type 2 diabetes mellitus without complications: Secondary | ICD-10-CM | POA: Diagnosis not present

## 2018-10-19 DIAGNOSIS — Y999 Unspecified external cause status: Secondary | ICD-10-CM | POA: Insufficient documentation

## 2018-10-19 DIAGNOSIS — Z79899 Other long term (current) drug therapy: Secondary | ICD-10-CM | POA: Insufficient documentation

## 2018-10-19 DIAGNOSIS — Y929 Unspecified place or not applicable: Secondary | ICD-10-CM | POA: Diagnosis not present

## 2018-10-19 DIAGNOSIS — W010XXA Fall on same level from slipping, tripping and stumbling without subsequent striking against object, initial encounter: Secondary | ICD-10-CM | POA: Insufficient documentation

## 2018-10-19 DIAGNOSIS — S8001XA Contusion of right knee, initial encounter: Secondary | ICD-10-CM | POA: Diagnosis not present

## 2018-10-19 DIAGNOSIS — S8991XA Unspecified injury of right lower leg, initial encounter: Secondary | ICD-10-CM | POA: Diagnosis present

## 2018-10-19 DIAGNOSIS — M79642 Pain in left hand: Secondary | ICD-10-CM | POA: Insufficient documentation

## 2018-10-19 NOTE — ED Provider Notes (Signed)
[place] EMERGENCY DEPARTMENT Provider Note   CSN: [id number] Arrival date & time: 10/19/18  1108    History   Chief Complaint Chief Complaint  Patient presents with  . Leg Pain    HPI [name] is a 63 y.o. female.     HPI Patient presents after a fall.  States 2 days ago she was walking apart and her left leg gave out.  States she had a previous stroke in her left leg is weak at times.  States she hurt her right knee however.  Pain is right below the right knee laterally.  No hip pain.  States it was a little neck pain initially but is that is resolved.  No back pain.  Not on anticoagulation.  Did not hit her head. Patient is also worried about her left hand.  States she previously broke the hand and never followed up.  States she has some difficulty using the hand.  States she took the cast off herself. Past Medical History:  Diagnosis Date  . Colon cancer ([place])   . Diabetes mellitus   . Stroke [facility])     Patient Active Problem List   Diagnosis Date Noted  . Rectal bleeding 04/10/2012  . Supratherapeutic INR 04/10/2012  . Heme positive stool 04/10/2012  . Melena 11/07/2011  . Coagulopathy ([place]) 11/07/2011  . H/O colon cancer 11/07/2011  . OTHER ANAPHYLACTIC REACTION 06/20/2010  . ULCERATION OF INTESTINE 04/05/2010  . ANEMIA 04/02/2010  . SCABIES 07/06/2009  . UPPER RESPIRATORY INFECTION 07/06/2009  . PRURITUS, VAGINAL 01/25/2009  . URINARY INCONTINENCE 01/25/2009  . CANDIDIASIS, VAGINAL 11/06/2008  . EXTERNAL OTITIS 07/26/2008  . CHEST PAIN 05/05/2008  . EPISTAXIS 10/07/2007  . COLONIC POLYPS, HX OF 02/09/2007  . ANEMIA, B12 DEFICIENCY 01/19/2007  . SINUSITIS, ACUTE NOS 01/19/2007  . HYPERLIPIDEMIA 11/10/2006  . HYPERTENSION 11/10/2006  . Unspecified cerebral artery occlusion with cerebral infarction 06/02/2001  . PATENT FORAMEN OVALE 04/21/2001  . DIABETES MELLITUS, TYPE II, UNCONTROLLED 09/01/1999    Past Surgical History:   Procedure Laterality Date  . CHOLECYSTECTOMY    . COLON SURGERY    . ESOPHAGOGASTRODUODENOSCOPY  11/07/2011   Procedure: ESOPHAGOGASTRODUODENOSCOPY (EGD);  Surgeon: Jerene Bears, MD;  Location: [facility];  Service: Gastroenterology;  Laterality: N/A;     OB History   No obstetric history on file.      Home Medications    Prior to Admission medications   Medication Sig Start Date End Date Taking? Authorizing Provider  aspirin EC 81 MG tablet Take 81 mg by mouth daily.   Yes [provider]  insulin aspart (NOVOLOG) 100 UNIT/ML injection Inject 15-20 Units into the skin 2 (two) times daily. Takes 20 units every night and 15 units every morning   Yes [provider]  insulin detemir (LEVEMIR) 100 UNIT/ML injection Inject 100 Units into the skin daily.   Yes [provider]  acetaminophen (TYLENOL) 500 MG tablet Take 1 tablet (500 mg total) by mouth every 6 (six) hours as needed. Patient not taking: Reported on 11/28/2016 08/13/14   Domenic Moras, PA-C  cephALEXin (KEFLEX) 500 MG capsule Take 1 capsule (500 mg total) by mouth 4 (four) times daily. Patient not taking: Reported on 11/28/2016 11/10/16   Lysbeth Penner, FNP  ciprofloxacin (CIPRO) 500 MG tablet Take 1 tablet (500 mg total) by mouth 2 (two) times daily. 11/28/16   Rolland Porter, MD  ciprofloxacin-dexamethasone (CIPRODEX) OTIC suspension Place 4 drops 2 (two) times  daily into the right ear. 04/21/17   Palumbo, April, MD  clindamycin (CLEOCIN) 300 MG capsule Take 1 capsule (300 mg total) 4 (four) times daily by mouth. X 7 days 04/21/17   Palumbo, April, MD  EPINEPHrine 0.3 mg/0.3 mL IJ SOAJ injection Inject 0.3 mLs (0.3 mg total) into the muscle as needed (Tongue swelling, anaphylaxis). 08/16/14   Evelina Bucy, MD  famotidine (PEPCID) 20 MG tablet Take 1 tablet (20 mg total) by mouth 2 (two) times daily. Patient not taking: Reported on 11/28/2016 09/16/15   Palumbo, April, MD  ferrous sulfate 325 (65 FE) MG  tablet Take 325 mg by mouth daily with breakfast.     [provider]  methocarbamol (ROBAXIN) 500 MG tablet Take 1 tablet (500 mg total) by mouth 2 (two) times daily. 12/17/17   Loura Halt A, NP  nitrofurantoin, macrocrystal-monohydrate, (MACROBID) 100 MG capsule Take 1 capsule (100 mg total) by mouth 2 (two) times daily. 12/17/17   Loura Halt A, NP  omeprazole (PRILOSEC) 40 MG capsule Take 1 capsule (40 mg total) by mouth daily. Patient not taking: Reported on 11/28/2016 11/10/16   Lysbeth Penner, FNP  phenazopyridine (PYRIDIUM) 200 MG tablet Take 1 tablet (200 mg total) by mouth 3 (three) times daily. Patient not taking: Reported on 11/28/2016 11/10/16   Lysbeth Penner, FNP  sucralfate (CARAFATE) 1 GM/10ML suspension Take 10 mLs (1 g total) by mouth 4 (four) times daily -  with meals and at bedtime. Patient not taking: Reported on 11/28/2016 09/16/15   Randal Buba, April, MD    Family History Family History  Problem Relation Age of Onset  . Cancer Other   . Diabetes Other     Social History Social History   Tobacco Use  . Smoking status: Never Smoker  . Smokeless tobacco: Never Used  Substance Use Topics  . Alcohol use: No  . Drug use: No     Allergies   Glipizide; Ibuprofen; Iohexol; Rosiglitazone-metformin; Hydrocodone-acetaminophen; Morphine; Pantoprazole; and Sulfamethoxazole   Review of Systems Review of Systems  Constitutional: Negative for appetite change.  Cardiovascular: Negative for chest pain.  Gastrointestinal: Negative for abdominal pain.  Musculoskeletal: Negative for neck pain.       Right knee pain  Skin: Negative for wound.  Neurological: Negative for weakness.     Physical Exam Updated Vital Signs BP 123/69 (BP Location: Right Arm)   Pulse 73   Temp (!) 97.3 F (36.3 C) (Oral)   Resp 16   Ht 5\' 3"  (1.6 m)   Wt 68.5 kg   SpO2 99%   BMI 26.75 kg/m   Physical Exam Vitals signs and nursing note reviewed.  HENT:     Head: Atraumatic.   Eyes:     Extraocular Movements: Extraocular movements intact.  Neck:     Musculoskeletal: Neck supple.  Cardiovascular:     Rate and Rhythm: Regular rhythm.  Musculoskeletal:     Comments: Tenderness over proximal fibula on right side.  Knee stable.  Good range of motion.  No tenderness over hip or ankle.  Good range of motion hip and ankle.  Some mild deformity over fifth metacarpal on left hand.  Chronic.  Difficulty making a fist.  Skin intact.  Skin:    Capillary Refill: Capillary refill takes less than 2 seconds.  Neurological:     General: No focal deficit present.     Mental Status: She is alert.      ED Treatments / Results  Labs (all  labs ordered are listed, but only abnormal results are displayed) Labs Reviewed - No data to display  EKG None  Radiology Dg Tibia/fibula Right  Result Date: 10/19/2018 CLINICAL DATA:  Right lateral lower leg pain since falling 2 days ago. History of CVA. EXAM: RIGHT TIBIA AND FIBULA - 2 VIEW COMPARISON:  Right ankle radiographs 04/21/2012. FINDINGS: There is some rotation on the lateral view of the knee. The mineralization appears adequate. No evidence of acute fracture or dislocation. There is no focal soft tissue swelling or foreign body. IMPRESSION: No acute osseous findings. Electronically Signed   By: Richardean Sale M.D.   On: 10/19/2018 12:16   Dg Hand Complete Left  Result Date: 10/19/2018 CLINICAL DATA:  Left hand pain after fall 2 days ago. EXAM: LEFT HAND - COMPLETE 3+ VIEW COMPARISON:  Radiographs of May 21, 2018. FINDINGS: There is no evidence of acute fracture or dislocation. There is no evidence of arthropathy or other focal bone abnormality. Vascular calcifications are noted. IMPRESSION: No acute abnormality seen in the left hand. Electronically Signed   By: Marijo Conception M.D.   On: 10/19/2018 12:17    Procedures Procedures (including critical care time)  Medications Ordered in ED Medications - No data to display    Initial Impression / Assessment and Plan / ED Course  I have reviewed the triage vital signs and the nursing notes.  Pertinent labs & imaging results that were available during my care of the patient were reviewed by me and considered in my medical decision making (see chart for details).        Patient with fall.  Pain and right lower leg.  X-ray reassuring.  Also x-ray done of left hand due to recent fracture.  Overall reassuring.  Outpatient follow-up as needed.  Final Clinical Impressions(s) / ED Diagnoses   Final diagnoses:  Contusion of right knee and lower leg, initial encounter    ED Discharge Orders    None       Davonna Belling, MD 10/20/18 1537

## 2018-10-19 NOTE — ED Notes (Signed)
ED Provider at bedside. 

## 2018-10-19 NOTE — ED Triage Notes (Signed)
Fell 2 days ago while walking in the park.  Sts her left leg gave out on her and she fell on to her right leg. Sts she had a previous CVA and has deficits on the left but this is the first time it has caused her to fall. Pain is on right lateral lower leg.  No obvious swelling.  Skin is intact.

## 2018-10-19 NOTE — Discharge Instructions (Addendum)
Follow-up with Dr. Barbaraann Barthel, the sports medicine doctor as needed for the chronic pain and swelling in her left hand since the fracture.

## 2019-01-15 ENCOUNTER — Emergency Department (HOSPITAL_COMMUNITY): Payer: Medicare Other | Admitting: Anesthesiology

## 2019-01-15 ENCOUNTER — Emergency Department (HOSPITAL_COMMUNITY): Payer: Medicare Other

## 2019-01-15 ENCOUNTER — Encounter (HOSPITAL_COMMUNITY)
Admission: EM | Disposition: A | Discharge status: Skilled Nursing Facility | Payer: Self-pay | Source: Home / Self Care | Attending: Internal Medicine

## 2019-01-15 ENCOUNTER — Encounter (HOSPITAL_COMMUNITY): Payer: Self-pay

## 2019-01-15 ENCOUNTER — Other Ambulatory Visit: Payer: Self-pay

## 2019-01-15 ENCOUNTER — Inpatient Hospital Stay (HOSPITAL_COMMUNITY)
Admission: EM | Admit: 2019-01-15 | Discharge: 2019-01-18 | DRG: 501 | Disposition: A | Discharge status: Skilled Nursing Facility | Payer: Medicare Other | Attending: Internal Medicine | Admitting: Internal Medicine

## 2019-01-15 DIAGNOSIS — S0003XA Contusion of scalp, initial encounter: Secondary | ICD-10-CM | POA: Diagnosis present

## 2019-01-15 DIAGNOSIS — I69354 Hemiplegia and hemiparesis following cerebral infarction affecting left non-dominant side: Secondary | ICD-10-CM

## 2019-01-15 DIAGNOSIS — Z8611 Personal history of tuberculosis: Secondary | ICD-10-CM

## 2019-01-15 DIAGNOSIS — E119 Type 2 diabetes mellitus without complications: Secondary | ICD-10-CM

## 2019-01-15 DIAGNOSIS — W010XXA Fall on same level from slipping, tripping and stumbling without subsequent striking against object, initial encounter: Secondary | ICD-10-CM | POA: Diagnosis present

## 2019-01-15 DIAGNOSIS — I10 Essential (primary) hypertension: Secondary | ICD-10-CM | POA: Diagnosis present

## 2019-01-15 DIAGNOSIS — I69359 Hemiplegia and hemiparesis following cerebral infarction affecting unspecified side: Secondary | ICD-10-CM

## 2019-01-15 DIAGNOSIS — Z888 Allergy status to other drugs, medicaments and biological substances status: Secondary | ICD-10-CM

## 2019-01-15 DIAGNOSIS — S82009A Unspecified fracture of unspecified patella, initial encounter for closed fracture: Secondary | ICD-10-CM | POA: Diagnosis present

## 2019-01-15 DIAGNOSIS — Z794 Long term (current) use of insulin: Secondary | ICD-10-CM

## 2019-01-15 DIAGNOSIS — Z01818 Encounter for other preprocedural examination: Secondary | ICD-10-CM | POA: Diagnosis not present

## 2019-01-15 DIAGNOSIS — Z20822 Contact with and (suspected) exposure to covid-19: Secondary | ICD-10-CM

## 2019-01-15 DIAGNOSIS — Z7982 Long term (current) use of aspirin: Secondary | ICD-10-CM

## 2019-01-15 DIAGNOSIS — Z85038 Personal history of other malignant neoplasm of large intestine: Secondary | ICD-10-CM | POA: Diagnosis present

## 2019-01-15 DIAGNOSIS — S82001A Unspecified fracture of right patella, initial encounter for closed fracture: Principal | ICD-10-CM | POA: Diagnosis present

## 2019-01-15 DIAGNOSIS — Z20828 Contact with and (suspected) exposure to other viral communicable diseases: Secondary | ICD-10-CM | POA: Diagnosis present

## 2019-01-15 DIAGNOSIS — Z885 Allergy status to narcotic agent status: Secondary | ICD-10-CM

## 2019-01-15 DIAGNOSIS — S82831A Other fracture of upper and lower end of right fibula, initial encounter for closed fracture: Secondary | ICD-10-CM | POA: Diagnosis present

## 2019-01-15 DIAGNOSIS — Z79899 Other long term (current) drug therapy: Secondary | ICD-10-CM

## 2019-01-15 DIAGNOSIS — D6832 Hemorrhagic disorder due to extrinsic circulating anticoagulants: Secondary | ICD-10-CM | POA: Diagnosis present

## 2019-01-15 DIAGNOSIS — T45515A Adverse effect of anticoagulants, initial encounter: Secondary | ICD-10-CM | POA: Diagnosis present

## 2019-01-15 DIAGNOSIS — Z833 Family history of diabetes mellitus: Secondary | ICD-10-CM

## 2019-01-15 DIAGNOSIS — Z9221 Personal history of antineoplastic chemotherapy: Secondary | ICD-10-CM

## 2019-01-15 DIAGNOSIS — E1165 Type 2 diabetes mellitus with hyperglycemia: Secondary | ICD-10-CM | POA: Diagnosis present

## 2019-01-15 DIAGNOSIS — Z886 Allergy status to analgesic agent status: Secondary | ICD-10-CM

## 2019-01-15 HISTORY — PX: PATELLAR TENDON REPAIR: SHX737

## 2019-01-15 LAB — CBC WITH DIFFERENTIAL/PLATELET
Abs Immature Granulocytes: 0.04 10*3/uL (ref 0.00–0.07)
Basophils Absolute: 0 10*3/uL (ref 0.0–0.1)
Basophils Relative: 0 %
Eosinophils Absolute: 0 10*3/uL (ref 0.0–0.5)
Eosinophils Relative: 0 %
HCT: 40 % (ref 36.0–46.0)
Hemoglobin: 13.3 g/dL (ref 12.0–15.0)
Immature Granulocytes: 0 %
Lymphocytes Relative: 18 %
Lymphs Abs: 1.8 10*3/uL (ref 0.7–4.0)
MCH: 26.8 pg (ref 26.0–34.0)
MCHC: 33.3 g/dL (ref 30.0–36.0)
MCV: 80.5 fL (ref 80.0–100.0)
Monocytes Absolute: 0.7 10*3/uL (ref 0.1–1.0)
Monocytes Relative: 7 %
Neutro Abs: 7.3 10*3/uL (ref 1.7–7.7)
Neutrophils Relative %: 75 %
Platelets: 216 10*3/uL (ref 150–400)
RBC: 4.97 MIL/uL (ref 3.87–5.11)
RDW: 14 % (ref 11.5–15.5)
WBC: 9.8 10*3/uL (ref 4.0–10.5)
nRBC: 0 % (ref 0.0–0.2)

## 2019-01-15 LAB — HEMOGLOBIN A1C
Hgb A1c MFr Bld: 8.9 % — ABNORMAL HIGH (ref 4.8–5.6)
Mean Plasma Glucose: 208.73 mg/dL

## 2019-01-15 LAB — BASIC METABOLIC PANEL
Anion gap: 8 (ref 5–15)
BUN: 12 mg/dL (ref 8–23)
CO2: 26 mmol/L (ref 22–32)
Calcium: 8.9 mg/dL (ref 8.9–10.3)
Chloride: 108 mmol/L (ref 98–111)
Creatinine, Ser: 0.51 mg/dL (ref 0.44–1.00)
GFR calc Af Amer: >60 mL/min (ref >60)
GFR calc non Af Amer: >60 mL/min (ref >60)
Glucose, Bld: 99 mg/dL (ref 70–99)
Potassium: 3.6 mmol/L (ref 3.5–5.1)
Sodium: 142 mmol/L (ref 135–145)

## 2019-01-15 LAB — GLUCOSE, CAPILLARY
Glucose-Capillary: 139 mg/dL — ABNORMAL HIGH (ref 70–99)
Glucose-Capillary: 237 mg/dL — ABNORMAL HIGH (ref 70–99)
Glucose-Capillary: 232 mg/dL — ABNORMAL HIGH (ref 70–99)

## 2019-01-15 LAB — LIPID PANEL
Cholesterol: 150 mg/dL (ref 0–200)
HDL: 71 mg/dL (ref >40)
LDL Cholesterol: 64 mg/dL (ref 0–99)
Total CHOL/HDL Ratio: 2.1 RATIO
Triglycerides: 74 mg/dL (ref <150)
VLDL: 15 mg/dL (ref 0–40)

## 2019-01-15 LAB — SARS CORONAVIRUS 2 BY RT PCR (HOSPITAL ORDER, PERFORMED IN ~~LOC~~ HOSPITAL LAB): SARS Coronavirus 2: NEGATIVE

## 2019-01-15 SURGERY — REPAIR, TENDON, PATELLAR
Anesthesia: General | Site: Knee

## 2019-01-15 MED ORDER — SODIUM CHLORIDE 0.9 % IV BOLUS
1000.0000 mL | Freq: Once | INTRAVENOUS | Status: AC
  Administered 2019-01-15: 1000 mL via INTRAVENOUS

## 2019-01-15 MED ORDER — INSULIN ASPART 100 UNIT/ML ~~LOC~~ SOLN
0.0000 [IU] | Freq: Every day | SUBCUTANEOUS | Status: DC
  Administered 2019-01-16 (×2): 2 [IU] via SUBCUTANEOUS

## 2019-01-15 MED ORDER — CEFAZOLIN SODIUM-DEXTROSE 2-4 GM/100ML-% IV SOLN
2.0000 g | INTRAVENOUS | Status: AC
  Administered 2019-01-15: 2 g via INTRAVENOUS

## 2019-01-15 MED ORDER — FENTANYL CITRATE (PF) 100 MCG/2ML IJ SOLN
INTRAMUSCULAR | Status: DC | PRN
  Administered 2019-01-15: 25 ug via INTRAVENOUS
  Administered 2019-01-15: 50 ug via INTRAVENOUS
  Administered 2019-01-15: 25 ug via INTRAVENOUS
  Administered 2019-01-15 (×2): 50 ug via INTRAVENOUS

## 2019-01-15 MED ORDER — FENTANYL CITRATE (PF) 100 MCG/2ML IJ SOLN
INTRAMUSCULAR | Status: AC
  Filled 2019-01-15: qty 2

## 2019-01-15 MED ORDER — LACTATED RINGERS IV SOLN: INTRAVENOUS | Status: DC

## 2019-01-15 MED ORDER — ACETAMINOPHEN 500 MG PO TABS
1000.0000 mg | ORAL_TABLET | Freq: Once | ORAL | Status: AC
  Administered 2019-01-15: 1000 mg via ORAL
  Filled 2019-01-15: qty 2

## 2019-01-15 MED ORDER — CEFAZOLIN SODIUM-DEXTROSE 2-4 GM/100ML-% IV SOLN
INTRAVENOUS | Status: AC
  Filled 2019-01-15: qty 100

## 2019-01-15 MED ORDER — DOCUSATE SODIUM 100 MG PO CAPS
100.0000 mg | ORAL_CAPSULE | Freq: Two times a day (BID) | ORAL | Status: DC
  Administered 2019-01-15 – 2019-01-17 (×5): 100 mg via ORAL
  Filled 2019-01-15 (×5): qty 1

## 2019-01-15 MED ORDER — DEXAMETHASONE SODIUM PHOSPHATE 10 MG/ML IJ SOLN
INTRAMUSCULAR | Status: AC
  Filled 2019-01-15: qty 2

## 2019-01-15 MED ORDER — ONDANSETRON HCL 4 MG/2ML IJ SOLN
INTRAMUSCULAR | Status: DC | PRN
  Administered 2019-01-15 (×2): 4 mg via INTRAVENOUS

## 2019-01-15 MED ORDER — POVIDONE-IODINE 10 % EX SWAB: 2.0000 "application " | Freq: Once | CUTANEOUS | Status: DC

## 2019-01-15 MED ORDER — MIDAZOLAM HCL 5 MG/5ML IJ SOLN
INTRAMUSCULAR | Status: DC | PRN
  Administered 2019-01-15: 2 mg via INTRAVENOUS

## 2019-01-15 MED ORDER — ASPIRIN EC 81 MG PO TBEC
81.0000 mg | DELAYED_RELEASE_TABLET | Freq: Every day | ORAL | Status: DC
  Administered 2019-01-16 – 2019-01-17 (×2): 81 mg via ORAL
  Filled 2019-01-15 (×2): qty 1

## 2019-01-15 MED ORDER — ACETAMINOPHEN 325 MG PO TABS: 325.0000 mg | ORAL_TABLET | Freq: Four times a day (QID) | ORAL | Status: DC | PRN

## 2019-01-15 MED ORDER — INSULIN ASPART 100 UNIT/ML ~~LOC~~ SOLN
0.0000 [IU] | Freq: Three times a day (TID) | SUBCUTANEOUS | Status: DC
  Administered 2019-01-15: 3 [IU] via SUBCUTANEOUS

## 2019-01-15 MED ORDER — POLYETHYLENE GLYCOL 3350 17 G PO PACK
17.0000 g | PACK | Freq: Every day | ORAL | Status: DC
  Administered 2019-01-16 – 2019-01-17 (×2): 17 g via ORAL
  Filled 2019-01-15 (×2): qty 1

## 2019-01-15 MED ORDER — MIDAZOLAM HCL 2 MG/2ML IJ SOLN
INTRAMUSCULAR | Status: AC
  Filled 2019-01-15: qty 2

## 2019-01-15 MED ORDER — PRAVASTATIN SODIUM 40 MG PO TABS
40.0000 mg | ORAL_TABLET | Freq: Every day | ORAL | Status: DC
  Administered 2019-01-15 – 2019-01-17 (×3): 40 mg via ORAL
  Filled 2019-01-15 (×2): qty 2
  Filled 2019-01-15: qty 1
  Filled 2019-01-15: qty 2
  Filled 2019-01-15 (×2): qty 1

## 2019-01-15 MED ORDER — BUPIVACAINE HCL 0.25 % IJ SOLN
INTRAMUSCULAR | Status: DC | PRN
  Administered 2019-01-15: 20 mL

## 2019-01-15 MED ORDER — METHOCARBAMOL 500 MG IVPB - SIMPLE MED
500.0000 mg | Freq: Four times a day (QID) | INTRAVENOUS | Status: DC | PRN
  Administered 2019-01-15: 500 mg via INTRAVENOUS
  Filled 2019-01-15: qty 50

## 2019-01-15 MED ORDER — HYDROMORPHONE HCL 1 MG/ML IJ SOLN
0.5000 mg | INTRAMUSCULAR | Status: DC | PRN
  Administered 2019-01-15: 1 mg via INTRAVENOUS
  Filled 2019-01-15: qty 1

## 2019-01-15 MED ORDER — ONDANSETRON HCL 4 MG/2ML IJ SOLN
4.0000 mg | Freq: Four times a day (QID) | INTRAMUSCULAR | Status: DC | PRN
  Administered 2019-01-15: 4 mg via INTRAVENOUS
  Filled 2019-01-15 (×2): qty 2

## 2019-01-15 MED ORDER — ONDANSETRON HCL 4 MG/2ML IJ SOLN
INTRAMUSCULAR | Status: AC
  Filled 2019-01-15: qty 4

## 2019-01-15 MED ORDER — OXYCODONE HCL 5 MG PO TABS: 10.0000 mg | ORAL_TABLET | ORAL | Status: DC | PRN

## 2019-01-15 MED ORDER — 0.9 % SODIUM CHLORIDE (POUR BTL) OPTIME
TOPICAL | Status: DC | PRN
  Administered 2019-01-15: 1000 mL

## 2019-01-15 MED ORDER — OXYCODONE HCL 5 MG PO TABS
5.0000 mg | ORAL_TABLET | ORAL | Status: DC | PRN
  Administered 2019-01-17: 5 mg via ORAL
  Filled 2019-01-15: qty 1

## 2019-01-15 MED ORDER — BUPIVACAINE HCL (PF) 0.25 % IJ SOLN
INTRAMUSCULAR | Status: AC
  Filled 2019-01-15: qty 30

## 2019-01-15 MED ORDER — INSULIN DETEMIR 100 UNIT/ML ~~LOC~~ SOLN
10.0000 [IU] | Freq: Every day | SUBCUTANEOUS | Status: DC
  Administered 2019-01-15: 10 [IU] via SUBCUTANEOUS
  Filled 2019-01-15: qty 0.1

## 2019-01-15 MED ORDER — METOCLOPRAMIDE HCL 5 MG/ML IJ SOLN: 10.0000 mg | Freq: Once | INTRAMUSCULAR | Status: DC | PRN

## 2019-01-15 MED ORDER — LACTATED RINGERS IV SOLN
INTRAVENOUS | Status: DC | PRN
  Administered 2019-01-15: 13:00:00 via INTRAVENOUS

## 2019-01-15 MED ORDER — LIDOCAINE 2% (20 MG/ML) 5 ML SYRINGE
INTRAMUSCULAR | Status: DC | PRN
  Administered 2019-01-15: 80 mg via INTRAVENOUS

## 2019-01-15 MED ORDER — ACETAMINOPHEN 500 MG PO TABS
1000.0000 mg | ORAL_TABLET | Freq: Three times a day (TID) | ORAL | Status: DC
  Administered 2019-01-15 – 2019-01-17 (×8): 1000 mg via ORAL
  Filled 2019-01-15 (×8): qty 2

## 2019-01-15 MED ORDER — METHOCARBAMOL 500 MG IVPB - SIMPLE MED
INTRAVENOUS | Status: AC
  Filled 2019-01-15: qty 50

## 2019-01-15 MED ORDER — LIDOCAINE 2% (20 MG/ML) 5 ML SYRINGE
INTRAMUSCULAR | Status: AC
  Filled 2019-01-15: qty 10

## 2019-01-15 MED ORDER — PHENYLEPHRINE 40 MCG/ML (10ML) SYRINGE FOR IV PUSH (FOR BLOOD PRESSURE SUPPORT)
PREFILLED_SYRINGE | INTRAVENOUS | Status: AC
  Filled 2019-01-15: qty 10

## 2019-01-15 MED ORDER — DEXAMETHASONE SODIUM PHOSPHATE 10 MG/ML IJ SOLN
INTRAMUSCULAR | Status: DC | PRN
  Administered 2019-01-15: 10 mg via INTRAVENOUS

## 2019-01-15 MED ORDER — METOCLOPRAMIDE HCL 5 MG/ML IJ SOLN
5.0000 mg | Freq: Three times a day (TID) | INTRAMUSCULAR | Status: DC | PRN
  Administered 2019-01-17: 10 mg via INTRAVENOUS
  Filled 2019-01-15: qty 2

## 2019-01-15 MED ORDER — CHLORHEXIDINE GLUCONATE 4 % EX LIQD: 60.0000 mL | Freq: Once | CUTANEOUS | Status: DC

## 2019-01-15 MED ORDER — SUCCINYLCHOLINE CHLORIDE 200 MG/10ML IV SOSY
PREFILLED_SYRINGE | INTRAVENOUS | Status: AC
  Filled 2019-01-15: qty 20

## 2019-01-15 MED ORDER — METOCLOPRAMIDE HCL 5 MG PO TABS: 5.0000 mg | ORAL_TABLET | Freq: Three times a day (TID) | ORAL | Status: DC | PRN

## 2019-01-15 MED ORDER — MEPERIDINE HCL 50 MG/ML IJ SOLN: 6.2500 mg | INTRAMUSCULAR | Status: DC | PRN

## 2019-01-15 MED ORDER — ONDANSETRON HCL 4 MG/2ML IJ SOLN
INTRAMUSCULAR | Status: AC
  Filled 2019-01-15: qty 2

## 2019-01-15 MED ORDER — ONDANSETRON HCL 4 MG PO TABS
4.0000 mg | ORAL_TABLET | Freq: Four times a day (QID) | ORAL | Status: DC | PRN
  Administered 2019-01-15 – 2019-01-17 (×2): 4 mg via ORAL
  Filled 2019-01-15 (×2): qty 1

## 2019-01-15 MED ORDER — PROPOFOL 10 MG/ML IV BOLUS
INTRAVENOUS | Status: DC | PRN
  Administered 2019-01-15: 170 mg via INTRAVENOUS

## 2019-01-15 MED ORDER — PHENYLEPHRINE 40 MCG/ML (10ML) SYRINGE FOR IV PUSH (FOR BLOOD PRESSURE SUPPORT)
PREFILLED_SYRINGE | INTRAVENOUS | Status: DC | PRN
  Administered 2019-01-15 (×3): 80 ug via INTRAVENOUS

## 2019-01-15 MED ORDER — METHOCARBAMOL 500 MG PO TABS
500.0000 mg | ORAL_TABLET | Freq: Four times a day (QID) | ORAL | Status: DC | PRN
  Administered 2019-01-16 – 2019-01-17 (×3): 500 mg via ORAL
  Filled 2019-01-15 (×4): qty 1

## 2019-01-15 MED ORDER — ONDANSETRON HCL 4 MG/2ML IJ SOLN: 4.0000 mg | Freq: Four times a day (QID) | INTRAMUSCULAR | Status: DC | PRN

## 2019-01-15 MED ORDER — FENTANYL CITRATE (PF) 100 MCG/2ML IJ SOLN
25.0000 ug | INTRAMUSCULAR | Status: DC | PRN
  Administered 2019-01-15 (×2): 50 ug via INTRAVENOUS

## 2019-01-15 MED ORDER — ONDANSETRON HCL 4 MG PO TABS: 4.0000 mg | ORAL_TABLET | Freq: Four times a day (QID) | ORAL | Status: DC | PRN

## 2019-01-15 SURGICAL SUPPLY — 50 items
APL PRP STRL LF DISP 70% ISPRP (MISCELLANEOUS) ×1
BANDAGE ESMARK 6X9 LF (GAUZE/BANDAGES/DRESSINGS) IMPLANT
BIT DRILL 1.6MX128 (BIT) ×1 IMPLANT
BIT DRILL 1.6MX128MM (BIT) ×1
BNDG CMPR 9X6 STRL LF SNTH (GAUZE/BANDAGES/DRESSINGS) ×1
BNDG CMPR MED 10X6 ELC LF (GAUZE/BANDAGES/DRESSINGS) ×1
BNDG ELASTIC 6X10 VLCR STRL LF (GAUZE/BANDAGES/DRESSINGS) ×2 IMPLANT
BNDG ESMARK 6X9 LF (GAUZE/BANDAGES/DRESSINGS) ×3
CHLORAPREP W/TINT 26 (MISCELLANEOUS) ×2 IMPLANT
COVER SURGICAL LIGHT HANDLE (MISCELLANEOUS) ×5 IMPLANT
COVER WAND RF STERILE (DRAPES) IMPLANT
CUFF TOURN SGL QUICK 34 (TOURNIQUET CUFF) ×3
CUFF TRNQT CYL 34X4.125X (TOURNIQUET CUFF) IMPLANT
DRAPE SURG 17X23 STRL (DRAPES) ×2 IMPLANT
DRSG EMULSION OIL 3X3 NADH (GAUZE/BANDAGES/DRESSINGS) ×2 IMPLANT
DRSG PAD ABDOMINAL 8X10 ST (GAUZE/BANDAGES/DRESSINGS) ×2 IMPLANT
ELECT REM PT RETURN 15FT ADLT (MISCELLANEOUS) ×2 IMPLANT
GAUZE SPONGE 4X4 12PLY STRL (GAUZE/BANDAGES/DRESSINGS) ×2 IMPLANT
GLOVE BIO SURGEON STRL SZ7 (GLOVE) ×2 IMPLANT
GLOVE BIO SURGEON STRL SZ7.5 (GLOVE) ×2 IMPLANT
GLOVE BIOGEL PI IND STRL 8 (GLOVE) ×1 IMPLANT
GLOVE BIOGEL PI INDICATOR 8 (GLOVE) ×4
GOWN STRL REUS W/ TWL LRG LVL3 (GOWN DISPOSABLE) IMPLANT
GOWN STRL REUS W/ TWL XL LVL3 (GOWN DISPOSABLE) IMPLANT
GOWN STRL REUS W/TWL LRG LVL3 (GOWN DISPOSABLE) ×3
GOWN STRL REUS W/TWL XL LVL3 (GOWN DISPOSABLE) ×3
IMMOBILIZER KNEE 20 (SOFTGOODS) ×3
IMMOBILIZER KNEE 20 THIGH 36 (SOFTGOODS) IMPLANT
IMMOBILIZER KNEE 22 UNIV (SOFTGOODS) ×2 IMPLANT
KIT BASIN OR (CUSTOM PROCEDURE TRAY) ×2 IMPLANT
KIT TURNOVER KIT A (KITS) IMPLANT
MANIFOLD NEPTUNE II (INSTRUMENTS) ×3 IMPLANT
NEEDLE HYPO 22GX1.5 SAFETY (NEEDLE) ×2 IMPLANT
PACK ORTHO EXTREMITY (CUSTOM PROCEDURE TRAY) ×2 IMPLANT
PROTECTOR NERVE ULNAR (MISCELLANEOUS) ×3 IMPLANT
SPONGE LAP 4X18 RFD (DISPOSABLE) ×2 IMPLANT
STAPLER VISISTAT 35W (STAPLE) ×2 IMPLANT
SUCTION FRAZIER HANDLE 10FR (MISCELLANEOUS) ×2
SUCTION TUBE FRAZIER 10FR DISP (MISCELLANEOUS) IMPLANT
SUT FIBERWIRE #2 38 T-5 BLUE (SUTURE) ×6
SUT MNCRL AB 4-0 PS2 18 (SUTURE) ×2 IMPLANT
SUT VIC AB 0 CT1 18XCR BRD 8 (SUTURE) IMPLANT
SUT VIC AB 0 CT1 8-18 (SUTURE) ×3
SUT VIC AB 2-0 CT1 27 (SUTURE) ×3
SUT VIC AB 2-0 CT1 TAPERPNT 27 (SUTURE) IMPLANT
SUT VIC AB 2-0 SH 18 (SUTURE) ×2 IMPLANT
SUTURE FIBERWR #2 38 T-5 BLUE (SUTURE) IMPLANT
SYR CONTROL 10ML LL (SYRINGE) ×2 IMPLANT
TRAY CATH 16FR W/PLASTIC CATH (SET/KITS/TRAYS/PACK) ×2 IMPLANT
WRAP KNEE MAXI GEL POST OP (GAUZE/BANDAGES/DRESSINGS) ×2 IMPLANT

## 2019-01-15 NOTE — Consult Note (Signed)
Reason for Consult: right leg injury Referring Physician: Dr. Gilford Raid, Dionicia Abler [name] is an 63 y.o. female.  HPI: Slipped and fell on water in bathroom directly on right knee this morning. Unable to bear weight on right lower extremity. History of prior stroke 20years ago with residual L LE weakness. Takes asa, no other anticoagulation. Has been NPO since this am.   Past Medical History:  Diagnosis Date  . Colon cancer ([place])   . Diabetes mellitus   . Stroke [facility])     Past Surgical History:  Procedure Laterality Date  . CHOLECYSTECTOMY    . COLON SURGERY    . ESOPHAGOGASTRODUODENOSCOPY  11/07/2011   Procedure: ESOPHAGOGASTRODUODENOSCOPY (EGD);  Surgeon: Jerene Bears, MD;  Location: [facility];  Service: Gastroenterology;  Laterality: N/A;    Family History  Problem Relation Age of Onset  . Cancer Other   . Diabetes Other     Social History:  reports that she has never smoked. She has never used smokeless tobacco. She reports that she does not drink alcohol or use drugs.  Allergies:  Allergies  Allergen Reactions  . Glipizide Nausea And Vomiting    REACTION: nausea and vomiting  . Ibuprofen Other (See Comments)    REACTION: tongue swells and dyspnea  . Iohexol      Code: HIVES, Desc: pt called day after IV contrast c/o red, swollen itching feet and red face that started 4hrs after ct, Onset Date: [id number]   . Rosiglitazone-Metformin     REACTION: rash--tolerates Metformin alone  . Hydrocodone-Acetaminophen Nausea And Vomiting and Rash    REACTION: vomiting and rash  . Morphine Rash    REACTION: rash  . Pantoprazole Palpitations  . Sulfamethoxazole Rash    REACTION: rash    Medications: I have reviewed the patient's current medications.  Results for orders placed or performed during the hospital encounter of 01/15/19 (from the past 48 hour(s))  Basic metabolic panel     Status: None   Collection Time: 01/15/19 10:43 AM  Result Value Ref Range   Sodium 142 135 - 145 mmol/L   Potassium 3.6 3.5 - 5.1 mmol/L   Chloride 108 98 - 111 mmol/L   CO2 26 22 - 32 mmol/L   Glucose, Bld 99 70 - 99 mg/dL   BUN 12 8 - 23 mg/dL   Creatinine, Ser 0.51 0.44 - 1.00 mg/dL   Calcium 8.9 8.9 - 10.3 mg/dL   GFR calc non Af Amer >60 >60 mL/min   GFR calc Af Amer >60 >60 mL/min   Anion gap 8 5 - 15    Comment: Performed at [facility], [place] [address] [place], [place] [address]  CBC with Differential     Status: None   Collection Time: 01/15/19 10:43 AM  Result Value Ref Range   WBC 9.8 4.0 - 10.5 K/uL   RBC 4.97 3.87 - 5.11 MIL/uL   Hemoglobin 13.3 12.0 - 15.0 g/dL   HCT 40.0 36.0 - 46.0 %   MCV 80.5 80.0 - 100.0 fL   MCH 26.8 26.0 - 34.0 pg   MCHC 33.3 30.0 - 36.0 g/dL   RDW 14.0 11.5 - 15.5 %   Platelets 216 150 - 400 K/uL   nRBC 0.0 0.0 - 0.2 %   Neutrophils Relative % 75 %   Neutro Abs 7.3 1.7 - 7.7 K/uL   Lymphocytes Relative 18 %   Lymphs Abs 1.8 0.7 - 4.0 K/uL   Monocytes  Relative 7 %   Monocytes Absolute 0.7 0.1 - 1.0 K/uL   Eosinophils Relative 0 %   Eosinophils Absolute 0.0 0.0 - 0.5 K/uL   Basophils Relative 0 %   Basophils Absolute 0.0 0.0 - 0.1 K/uL   Immature Granulocytes 0 %   Abs Immature Granulocytes 0.04 0.00 - 0.07 K/uL    Comment: Performed at [facility], [place] [address] [place], [place] [address]  SARS Coronavirus 2 [facility] order, Performed in [facility] hospital lab) Nasopharyngeal Nasopharyngeal Swab     Status: None   Collection Time: 01/15/19 10:43 AM   Specimen: Nasopharyngeal Swab  Result Value Ref Range   SARS Coronavirus 2 NEGATIVE NEGATIVE    Comment: (NOTE) If result is NEGATIVE SARS-CoV-2 target nucleic acids are NOT DETECTED. The SARS-CoV-2 RNA is generally detectable in upper and lower  respiratory specimens during the acute phase of infection. The lowest  concentration of SARS-CoV-2 viral copies this assay can detect is 250  copies / mL. A negative result  does not preclude SARS-CoV-2 infection  and should not be used as the sole basis for treatment or other  patient management decisions.  A negative result may occur with  improper specimen collection / handling, submission of specimen other  than nasopharyngeal swab, presence of viral mutation(s) within the  areas targeted by this assay, and inadequate number of viral copies  (<250 copies / mL). A negative result must be combined with clinical  observations, patient history, and epidemiological information. If result is POSITIVE SARS-CoV-2 target nucleic acids are DETECTED. The SARS-CoV-2 RNA is generally detectable in upper and lower  respiratory specimens dur ing the acute phase of infection.  Positive  results are indicative of active infection with SARS-CoV-2.  Clinical  correlation with patient history and other diagnostic information is  necessary to determine patient infection status.  Positive results do  not rule out bacterial infection or co-infection with other viruses. If result is PRESUMPTIVE POSTIVE SARS-CoV-2 nucleic acids MAY BE PRESENT.   A presumptive positive result was obtained on the submitted specimen  and confirmed on repeat testing.  While 2019 novel coronavirus  (SARS-CoV-2) nucleic acids may be present in the submitted sample  additional confirmatory testing may be necessary for epidemiological  and / or clinical management purposes  to differentiate between  SARS-CoV-2 and other Sarbecovirus currently known to infect humans.  If clinically indicated additional testing with an alternate test  methodology [phone]) is advised. The SARS-CoV-2 RNA is generally  detectable in upper and lower respiratory sp ecimens during the acute  phase of infection. The expected result is Negative. Fact Sheet for Patients:  StrictlyIdeas.no Fact Sheet for Healthcare Providers: BankingDealers.co.za This test is not yet approved or  cleared by the [place] FDA and has been authorized for detection and/or diagnosis of SARS-CoV-2 by FDA under an Emergency Use Authorization (EUA).  This EUA will remain in effect (meaning this test can be used) for the duration of the COVID-19 declaration under Section 564(b)(1) of the Act, 21 U.S.C. section 360bbb-3(b)(1), unless the authorization is terminated or revoked sooner. Performed at [facility], [place] [address] [place], [place] [address]     Ct Head Wo Contrast  Result Date: 01/15/2019 CLINICAL DATA:  Headache.  Posttraumatic.  Slipped and hit head. EXAM: CT HEAD WITHOUT CONTRAST TECHNIQUE: Contiguous axial images were obtained from the base of the skull through the vertex without intravenous contrast. COMPARISON:  Head CT 08/17/2010 FINDINGS: Brain: No evidence  for acute hemorrhage, mass lesion, midline shift, hydrocephalus or large infarct. Small amount of mineralization in the basal ganglia region. Vascular: No hyperdense vessel or unexpected calcification. Skull: Scalp hematoma along the left posterior parietal region near the vertex. Negative for a calvarial fracture. Sinuses/Orbits: Visualized paranasal sinuses are clear. Other: None. IMPRESSION: 1. No acute intracranial abnormality. 2. Left posterior scalp hematoma.  No underlying fracture. Electronically Signed   By: Markus Daft M.D.   On: 01/15/2019 08:26   Dg Knee Complete 4 Views Right  Result Date: 01/15/2019 CLINICAL DATA:  [place] [address] in bathroom. Right knee pain and swelling. Initial encounter. EXAM: RIGHT KNEE - COMPLETE 4+ VIEW COMPARISON:  None. FINDINGS: A displaced fracture through the inferior aspect of the patella is seen. Marked prepatellar soft tissue swelling is seen but there is no evidence of joint effusion. Nondisplaced fracture of proximal fibula is also noted. IMPRESSION: 1. Displaced fracture through inferior patella. 2. Nondisplaced proximal fibular fracture. Electronically Signed   By:  Marlaine Hind M.D.   On: 01/15/2019 08:27    Review of Systems  Musculoskeletal: Positive for falls, joint pain and myalgias.  Neurological: Positive for weakness.       L sided weakness from previous stroke  All other systems reviewed and are negative.  Blood pressure 126/65, pulse 92, temperature 97.7 F (36.5 C), temperature source Oral, resp. rate 18, SpO2 96 %. Physical Exam  Constitutional: She is oriented to person, place, and time. She appears well-developed and well-nourished.  HENT:  Head: Normocephalic and atraumatic.  Eyes: EOM are normal.  Neck: Normal range of motion.  Cardiovascular: Intact distal pulses.  Respiratory: Effort normal.  Musculoskeletal:     Comments: R knee effusion and ecchymosis, diffusely TTP ROM limited by pain Unable to extend lower leg/straight leg raise Distally NVI  Neurological: She is alert and oriented to person, place, and time.  Skin: Skin is warm and dry.  Psychiatric: She has a normal mood and affect. Her behavior is normal. Judgment and thought content normal.    Assessment/Plan: Right displaced fracture inferior patella, stable nondisplaced prox fibular fx Will plan on patellar tendon repair today NPO NWB Will admit after surgery  Grier Mitts 01/15/2019, 12:13 PM

## 2019-01-15 NOTE — Transfer of Care (Signed)
Immediate Anesthesia Transfer of Care Note  Patient: [name]  Procedure(s) Performed: Procedure(s): PATELLA TENDON REPAIR (N/A)  Patient Location: PACU  Anesthesia Type:General  Level of Consciousness:  sedated, patient cooperative and responds to stimulation  Airway & Oxygen Therapy:Patient Spontanous Breathing and Patient connected to face mask oxgen  Post-op Assessment:  Report given to PACU RN and Post -op Vital signs reviewed and stable  Post vital signs:  Reviewed and stable  Last Vitals:  Vitals:   01/15/19 0901 01/15/19 1138  BP: 127/62 126/65  Pulse: 99 92  Resp: 18 18  Temp:  36.5 C  SpO2: 31% 74%    Complications: No apparent anesthesia complications

## 2019-01-15 NOTE — ED Provider Notes (Signed)
[facility] DEPT Provider Note   CSN: [id number] Arrival date & time: 01/15/19  2595    History   Chief Complaint Chief Complaint  Patient presents with  . Fall  . Knee Injury  . Head Injury    HPI [name] is a 63 y.o. female.     Pt presents to the ED today with right knee pain.  Pt slipped and fell in the bathroom about 2 hours prior to visit.  She did hit her head as well.  She said she's unable to walk on the leg.  Her left leg is weak from a prior stroke.  She takes asa, but no other blood thinners.     Past Medical History:  Diagnosis Date  . Colon cancer ([place])   . Diabetes mellitus   . Stroke [facility])     Patient Active Problem List   Diagnosis Date Noted  . Rectal bleeding 04/10/2012  . Supratherapeutic INR 04/10/2012  . Heme positive stool 04/10/2012  . Melena 11/07/2011  . Coagulopathy ([place]) 11/07/2011  . H/O colon cancer 11/07/2011  . OTHER ANAPHYLACTIC REACTION 06/20/2010  . ULCERATION OF INTESTINE 04/05/2010  . ANEMIA 04/02/2010  . SCABIES 07/06/2009  . UPPER RESPIRATORY INFECTION 07/06/2009  . PRURITUS, VAGINAL 01/25/2009  . URINARY INCONTINENCE 01/25/2009  . CANDIDIASIS, VAGINAL 11/06/2008  . EXTERNAL OTITIS 07/26/2008  . CHEST PAIN 05/05/2008  . EPISTAXIS 10/07/2007  . COLONIC POLYPS, HX OF 02/09/2007  . ANEMIA, B12 DEFICIENCY 01/19/2007  . SINUSITIS, ACUTE NOS 01/19/2007  . HYPERLIPIDEMIA 11/10/2006  . HYPERTENSION 11/10/2006  . Unspecified cerebral artery occlusion with cerebral infarction 06/02/2001  . PATENT FORAMEN OVALE 04/21/2001  . DIABETES MELLITUS, TYPE II, UNCONTROLLED 09/01/1999    Past Surgical History:  Procedure Laterality Date  . CHOLECYSTECTOMY    . COLON SURGERY    . ESOPHAGOGASTRODUODENOSCOPY  11/07/2011   Procedure: ESOPHAGOGASTRODUODENOSCOPY (EGD);  Surgeon: Jerene Bears, MD;  Location: [facility];  Service: Gastroenterology;  Laterality: N/A;     OB History   No  obstetric history on file.      Home Medications    Prior to Admission medications   Medication Sig Start Date End Date Taking? Authorizing Provider  acetaminophen (TYLENOL) 500 MG tablet Take 1 tablet (500 mg total) by mouth every 6 (six) hours as needed. 08/13/14  Yes Domenic Moras, PA-C  aspirin EC 81 MG tablet Take 81 mg by mouth daily.   Yes [provider]  EPINEPHrine 0.3 mg/0.3 mL IJ SOAJ injection Inject 0.3 mLs (0.3 mg total) into the muscle as needed (Tongue swelling, anaphylaxis). 08/16/14  Yes Evelina Bucy, MD  insulin aspart (NOVOLOG) 100 UNIT/ML injection Inject 15-20 Units into the skin 2 (two) times daily. Takes 20 units every night and 15 units every morning   Yes [provider]  insulin detemir (LEVEMIR) 100 UNIT/ML injection Inject 100 Units into the skin daily.   Yes [provider]  PRESCRIPTION MEDICATION Take 1 tablet by mouth daily. Cholesterol med, pharmacy has no record and pt doesn't know the name of it.   Yes [provider]  cephALEXin (KEFLEX) 500 MG capsule Take 1 capsule (500 mg total) by mouth 4 (four) times daily. Patient not taking: Reported on 11/28/2016 11/10/16   Lysbeth Penner, FNP  ciprofloxacin (CIPRO) 500 MG tablet Take 1 tablet (500 mg total) by mouth 2 (two) times daily. Patient not taking: Reported on 01/15/2019 11/28/16   Rolland Porter, MD  ciprofloxacin-dexamethasone [facility])  OTIC suspension Place 4 drops 2 (two) times daily into the right ear. Patient not taking: Reported on 01/15/2019 04/21/17   Palumbo, April, MD  clindamycin (CLEOCIN) 300 MG capsule Take 1 capsule (300 mg total) 4 (four) times daily by mouth. X 7 days Patient not taking: Reported on 01/15/2019 04/21/17   Palumbo, April, MD  famotidine (PEPCID) 20 MG tablet Take 1 tablet (20 mg total) by mouth 2 (two) times daily. Patient not taking: Reported on 11/28/2016 09/16/15   Palumbo, April, MD  methocarbamol (ROBAXIN) 500 MG tablet Take 1 tablet (500 mg  total) by mouth 2 (two) times daily. Patient not taking: Reported on 01/15/2019 12/17/17   Loura Halt A, NP  nitrofurantoin, macrocrystal-monohydrate, (MACROBID) 100 MG capsule Take 1 capsule (100 mg total) by mouth 2 (two) times daily. Patient not taking: Reported on 01/15/2019 12/17/17   Loura Halt A, NP  omeprazole (PRILOSEC) 40 MG capsule Take 1 capsule (40 mg total) by mouth daily. Patient not taking: Reported on 11/28/2016 11/10/16   Lysbeth Penner, FNP  phenazopyridine (PYRIDIUM) 200 MG tablet Take 1 tablet (200 mg total) by mouth 3 (three) times daily. Patient not taking: Reported on 11/28/2016 11/10/16   Lysbeth Penner, FNP  sucralfate (CARAFATE) 1 GM/10ML suspension Take 10 mLs (1 g total) by mouth 4 (four) times daily -  with meals and at bedtime. Patient not taking: Reported on 11/28/2016 09/16/15   Randal Buba, April, MD    Family History Family History  Problem Relation Age of Onset  . Cancer Other   . Diabetes Other     Social History Social History   Tobacco Use  . Smoking status: Never Smoker  . Smokeless tobacco: Never Used  Substance Use Topics  . Alcohol use: No  . Drug use: No     Allergies   Glipizide, Ibuprofen, Iohexol, Rosiglitazone-metformin, Hydrocodone-acetaminophen, Morphine, Pantoprazole, and Sulfamethoxazole   Review of Systems Review of Systems  Musculoskeletal:       Right knee pain  All other systems reviewed and are negative.    Physical Exam Updated Vital Signs BP 127/62 (BP Location: Left Arm)   Pulse 99   Temp 98.1 F (36.7 C) (Oral)   Resp 18   SpO2 97%   Physical Exam Vitals signs and nursing note reviewed.  Constitutional:      Appearance: Normal appearance.  HENT:     Head: Normocephalic and atraumatic.     Right Ear: External ear normal.     Left Ear: External ear normal.     Nose: Nose normal.     Mouth/Throat:     Mouth: Mucous membranes are moist.     Pharynx: Oropharynx is clear.  Eyes:     Extraocular  Movements: Extraocular movements intact.     Conjunctiva/sclera: Conjunctivae normal.     Pupils: Pupils are equal, round, and reactive to light.  Neck:     Musculoskeletal: Normal range of motion and neck supple.  Cardiovascular:     Rate and Rhythm: Normal rate and regular rhythm.     Pulses: Normal pulses.     Heart sounds: Normal heart sounds.  Pulmonary:     Effort: Pulmonary effort is normal.     Breath sounds: Normal breath sounds.  Abdominal:     General: Abdomen is flat. Bowel sounds are normal.     Palpations: Abdomen is soft.  Musculoskeletal:     Right knee: She exhibits decreased range of motion, swelling and ecchymosis.  Skin:  General: Skin is warm.     Capillary Refill: Capillary refill takes less than 2 seconds.  Neurological:     Mental Status: She is alert and oriented to person, place, and time.     Comments: Weakness on right from prior stroke.  Psychiatric:        Mood and Affect: Mood normal.        Behavior: Behavior normal.        Thought Content: Thought content normal.        Judgment: Judgment normal.      ED Treatments / Results  Labs (all labs ordered are listed, but only abnormal results are displayed) Labs Reviewed  SARS CORONAVIRUS 2 (HOSPITAL ORDER, [facility])  BASIC METABOLIC PANEL  CBC WITH DIFFERENTIAL/PLATELET  URINALYSIS, ROUTINE W REFLEX MICROSCOPIC    EKG None  Radiology Ct Head Wo Contrast  Result Date: 01/15/2019 CLINICAL DATA:  Headache.  Posttraumatic.  Slipped and hit head. EXAM: CT HEAD WITHOUT CONTRAST TECHNIQUE: Contiguous axial images were obtained from the base of the skull through the vertex without intravenous contrast. COMPARISON:  Head CT 08/17/2010 FINDINGS: Brain: No evidence for acute hemorrhage, mass lesion, midline shift, hydrocephalus or large infarct. Small amount of mineralization in the basal ganglia region. Vascular: No hyperdense vessel or unexpected calcification. Skull:  Scalp hematoma along the left posterior parietal region near the vertex. Negative for a calvarial fracture. Sinuses/Orbits: Visualized paranasal sinuses are clear. Other: None. IMPRESSION: 1. No acute intracranial abnormality. 2. Left posterior scalp hematoma.  No underlying fracture. Electronically Signed   By: Markus Daft M.D.   On: 01/15/2019 08:26   Dg Knee Complete 4 Views Right  Result Date: 01/15/2019 CLINICAL DATA:  [address] in bathroom. Right knee pain and swelling. Initial encounter. EXAM: RIGHT KNEE - COMPLETE 4+ VIEW COMPARISON:  None. FINDINGS: A displaced fracture through the inferior aspect of the patella is seen. Marked prepatellar soft tissue swelling is seen but there is no evidence of joint effusion. Nondisplaced fracture of proximal fibula is also noted. IMPRESSION: 1. Displaced fracture through inferior patella. 2. Nondisplaced proximal fibular fracture. Electronically Signed   By: Marlaine Hind M.D.   On: 01/15/2019 08:27    Procedures Procedures (including critical care time)  Medications Ordered in ED Medications  acetaminophen (TYLENOL) tablet 1,000 mg (1,000 mg Oral Given 01/15/19 0745)  sodium chloride 0.9 % bolus 1,000 mL (1,000 mLs Intravenous New Bag/Given 01/15/19 1042)  fentaNYL (SUBLIMAZE) 100 MCG/2ML injection (has no administration in time range)  midazolam (VERSED) 2 MG/2ML injection (has no administration in time range)     Initial Impression / Assessment and Plan / ED Course  I have reviewed the triage vital signs and the nursing notes.  Pertinent labs & imaging results that were available during my care of the patient were reviewed by me and considered in my medical decision making (see chart for details).     Pt d/w Dr. Tamera Punt ([place]) who will operate on her today at [facility].  He requests a hospitalist admission as she has multiple medical problems.  Pt d/w Dr. Florene Glen ([facility]) for admission.    Final Clinical Impressions(s) / ED Diagnoses   Final  diagnoses:  Closed displaced fracture of right patella, unspecified fracture morphology, initial encounter  Closed fracture of proximal end of right fibula, unspecified fracture morphology, initial encounter    ED Discharge Orders    None       Isla Pence, MD 01/15/19 1138

## 2019-01-15 NOTE — Anesthesia Preprocedure Evaluation (Signed)
Anesthesia Evaluation  Patient identified by MRN, date of birth, ID band Patient awake    Reviewed: Allergy & Precautions, NPO status , Patient's Chart, lab work & pertinent test results  Airway Mallampati: II  TM Distance: >3 FB Neck ROM: Full    Dental no notable dental hx.    Pulmonary neg pulmonary ROS,    Pulmonary exam normal breath sounds clear to auscultation       Cardiovascular negative cardio ROS Normal cardiovascular exam Rhythm:Regular Rate:Normal     Neuro/Psych CVA negative neurological ROS  negative psych ROS   GI/Hepatic Neg liver ROS, GERD  Medicated and Controlled,  Endo/Other  negative endocrine ROSdiabetes, Type 2  Renal/GU negative Renal ROS  negative genitourinary   Musculoskeletal negative musculoskeletal ROS (+)   Abdominal   Peds negative pediatric ROS (+)  Hematology negative hematology ROS (+)   Anesthesia Other Findings   Reproductive/Obstetrics negative OB ROS                             Anesthesia Physical Anesthesia Plan  ASA: II  Anesthesia Plan: General   Post-op Pain Management:    Induction: Intravenous  PONV Risk Score and Plan: 3 and Ondansetron, Dexamethasone, Midazolam and Treatment may vary due to age or medical condition  Airway Management Planned: LMA and Oral ETT  Additional Equipment:   Intra-op Plan:   Post-operative Plan:   Informed Consent: I have reviewed the patients History and Physical, chart, labs and discussed the procedure including the risks, benefits and alternatives for the proposed anesthesia with the patient or authorized representative who has indicated his/her understanding and acceptance.     Dental advisory given  Plan Discussed with: CRNA  Anesthesia Plan Comments:         Anesthesia Quick Evaluation

## 2019-01-15 NOTE — H&P (Deleted)
[name] Fire Krystal Clark is an 63 y.o. female.  HPI: Slipped and fell on water in bathroom directly on right knee this morning. Unable to bear weight on right lower extremity. History of prior stroke 20years ago with residual L LE weakness. Takes asa, no other anticoagulation. Has been NPO since this am.   Past Medical History:  Diagnosis Date  . Colon cancer ([place])   . Diabetes mellitus   . Stroke [facility])     Past Surgical History:  Procedure Laterality Date  . CHOLECYSTECTOMY    . COLON SURGERY    . ESOPHAGOGASTRODUODENOSCOPY  11/07/2011   Procedure: ESOPHAGOGASTRODUODENOSCOPY (EGD);  Surgeon: Jerene Bears, MD;  Location: [facility];  Service: Gastroenterology;  Laterality: N/A;    Family History  Problem Relation Age of Onset  . Cancer Other   . Diabetes Other     Social History:  reports that she has never smoked. She has never used smokeless tobacco. She reports that she does not drink alcohol or use drugs.  Allergies:  Allergies  Allergen Reactions  . Glipizide Nausea And Vomiting    REACTION: nausea and vomiting  . Ibuprofen Other (See Comments)    REACTION: tongue swells and dyspnea  . Iohexol      Code: HIVES, Desc: pt called day after IV contrast c/o red, swollen itching feet and red face that started 4hrs after ct, Onset Date: [id number]   . Rosiglitazone-Metformin     REACTION: rash--tolerates Metformin alone  . Hydrocodone-Acetaminophen Nausea And Vomiting and Rash    REACTION: vomiting and rash  . Morphine Rash    REACTION: rash  . Pantoprazole Palpitations  . Sulfamethoxazole Rash    REACTION: rash    Medications: I have reviewed the patient's current medications.  Results for orders placed or performed during the hospital encounter of 01/15/19 (from the past 48 hour(s))  Basic metabolic panel     Status: None   Collection Time: 01/15/19 10:43 AM  Result Value Ref Range   Sodium 142 135 - 145 mmol/L   Potassium 3.6 3.5 - 5.1 mmol/L   Chloride 108 98 -  111 mmol/L   CO2 26 22 - 32 mmol/L   Glucose, Bld 99 70 - 99 mg/dL   BUN 12 8 - 23 mg/dL   Creatinine, Ser 0.51 0.44 - 1.00 mg/dL   Calcium 8.9 8.9 - 10.3 mg/dL   GFR calc non Af Amer >60 >60 mL/min   GFR calc Af Amer >60 >60 mL/min   Anion gap 8 5 - 15    Comment: Performed at [facility], [place] [address], [place], [place] [address]  CBC with Differential     Status: None   Collection Time: 01/15/19 10:43 AM  Result Value Ref Range   WBC 9.8 4.0 - 10.5 K/uL   RBC 4.97 3.87 - 5.11 MIL/uL   Hemoglobin 13.3 12.0 - 15.0 g/dL   HCT 40.0 36.0 - 46.0 %   MCV 80.5 80.0 - 100.0 fL   MCH 26.8 26.0 - 34.0 pg   MCHC 33.3 30.0 - 36.0 g/dL   RDW 14.0 11.5 - 15.5 %   Platelets 216 150 - 400 K/uL   nRBC 0.0 0.0 - 0.2 %   Neutrophils Relative % 75 %   Neutro Abs 7.3 1.7 - 7.7 K/uL   Lymphocytes Relative 18 %   Lymphs Abs 1.8 0.7 - 4.0 K/uL   Monocytes Relative 7 %   Monocytes Absolute 0.7 0.1 - 1.0  K/uL   Eosinophils Relative 0 %   Eosinophils Absolute 0.0 0.0 - 0.5 K/uL   Basophils Relative 0 %   Basophils Absolute 0.0 0.0 - 0.1 K/uL   Immature Granulocytes 0 %   Abs Immature Granulocytes 0.04 0.00 - 0.07 K/uL    Comment: Performed at [facility], [place] [address], [place], [place] [address]  SARS Coronavirus 2 [facility] order, Performed in [facility] hospital lab) Nasopharyngeal Nasopharyngeal Swab     Status: None   Collection Time: 01/15/19 10:43 AM   Specimen: Nasopharyngeal Swab  Result Value Ref Range   SARS Coronavirus 2 NEGATIVE NEGATIVE    Comment: (NOTE) If result is NEGATIVE SARS-CoV-2 target nucleic acids are NOT DETECTED. The SARS-CoV-2 RNA is generally detectable in upper and lower  respiratory specimens during the acute phase of infection. The lowest  concentration of SARS-CoV-2 viral copies this assay can detect is 250  copies / mL. A negative result does not preclude SARS-CoV-2 infection  and should not be used as the sole basis  for treatment or other  patient management decisions.  A negative result may occur with  improper specimen collection / handling, submission of specimen other  than nasopharyngeal swab, presence of viral mutation(s) within the  areas targeted by this assay, and inadequate number of viral copies  (<250 copies / mL). A negative result must be combined with clinical  observations, patient history, and epidemiological information. If result is POSITIVE SARS-CoV-2 target nucleic acids are DETECTED. The SARS-CoV-2 RNA is generally detectable in upper and lower  respiratory specimens dur ing the acute phase of infection.  Positive  results are indicative of active infection with SARS-CoV-2.  Clinical  correlation with patient history and other diagnostic information is  necessary to determine patient infection status.  Positive results do  not rule out bacterial infection or co-infection with other viruses. If result is PRESUMPTIVE POSTIVE SARS-CoV-2 nucleic acids MAY BE PRESENT.   A presumptive positive result was obtained on the submitted specimen  and confirmed on repeat testing.  While 2019 novel coronavirus  (SARS-CoV-2) nucleic acids may be present in the submitted sample  additional confirmatory testing may be necessary for epidemiological  and / or clinical management purposes  to differentiate between  SARS-CoV-2 and other Sarbecovirus currently known to infect humans.  If clinically indicated additional testing with an alternate test  methodology [id number]) is advised. The SARS-CoV-2 RNA is generally  detectable in upper and lower respiratory sp ecimens during the acute  phase of infection. The expected result is Negative. Fact Sheet for Patients:  StrictlyIdeas.no Fact Sheet for Healthcare Providers: BankingDealers.co.za This test is not yet approved or cleared by the [place] FDA and has been authorized for detection and/or  diagnosis of SARS-CoV-2 by FDA under an Emergency Use Authorization (EUA).  This EUA will remain in effect (meaning this test can be used) for the duration of the COVID-19 declaration under Section 564(b)(1) of the Act, 21 U.S.C. section 360bbb-3(b)(1), unless the authorization is terminated or revoked sooner. Performed at [facility], [place] [address], [place], [place] [address]     Ct Head Wo Contrast  Result Date: 01/15/2019 CLINICAL DATA:  Headache.  Posttraumatic.  Slipped and hit head. EXAM: CT HEAD WITHOUT CONTRAST TECHNIQUE: Contiguous axial images were obtained from the base of the skull through the vertex without intravenous contrast. COMPARISON:  Head CT 08/17/2010 FINDINGS: Brain: No evidence for acute hemorrhage, mass lesion, midline shift, hydrocephalus or large infarct.  Small amount of mineralization in the basal ganglia region. Vascular: No hyperdense vessel or unexpected calcification. Skull: Scalp hematoma along the left posterior parietal region near the vertex. Negative for a calvarial fracture. Sinuses/Orbits: Visualized paranasal sinuses are clear. Other: None. IMPRESSION: 1. No acute intracranial abnormality. 2. Left posterior scalp hematoma.  No underlying fracture. Electronically Signed   By: Markus Daft M.D.   On: 01/15/2019 08:26   Dg Knee Complete 4 Views Right  Result Date: 01/15/2019 CLINICAL DATA:  [address] in bathroom. Right knee pain and swelling. Initial encounter. EXAM: RIGHT KNEE - COMPLETE 4+ VIEW COMPARISON:  None. FINDINGS: A displaced fracture through the inferior aspect of the patella is seen. Marked prepatellar soft tissue swelling is seen but there is no evidence of joint effusion. Nondisplaced fracture of proximal fibula is also noted. IMPRESSION: 1. Displaced fracture through inferior patella. 2. Nondisplaced proximal fibular fracture. Electronically Signed   By: Marlaine Hind M.D.   On: 01/15/2019 08:27    Review of Systems   Musculoskeletal: Positive for falls, joint pain and myalgias.  Neurological: Positive for weakness.       L sided weakness from previous stroke  All other systems reviewed and are negative.  Blood pressure 126/65, pulse 92, temperature 97.7 F (36.5 C), temperature source Oral, resp. rate 18, SpO2 96 %. Physical Exam  Constitutional: She is oriented to person, place, and time. She appears well-developed and well-nourished.  HENT:  Head: Normocephalic and atraumatic.  Eyes: EOM are normal.  Neck: Normal range of motion.  Cardiovascular: Intact distal pulses.  Respiratory: Effort normal.  Musculoskeletal:     Comments: R knee effusion and ecchymosis, diffusely TTP ROM limited by pain Unable to extend lower leg/straight leg raise Distally NVI  Neurological: She is alert and oriented to person, place, and time.  Skin: Skin is warm and dry.  Psychiatric: She has a normal mood and affect. Her behavior is normal. Judgment and thought content normal.    Assessment/Plan: Right displaced fracture inferior patella, stable nondisplaced prox fibular fx Will plan on patellar tendon repair today NPO NWB Will admit after surgery  Grier Mitts 01/15/2019, 12:13 PM

## 2019-01-15 NOTE — ED Triage Notes (Signed)
Pt reports that she slipped in her wet bathroom and hit her R knee and head on the floor. R knee is bruised and swollen. She states that she has been unable to walk on it since. She reports a slight headache, but most pain is her knee. Denies vision changes or LOC. Lump noted to  L posterior head. No blood thinners.

## 2019-01-15 NOTE — Op Note (Signed)
Procedure(s): PATELLA TENDON REPAIR Procedure Note  [name] [place] female 63 y.o. 01/15/2019  Procedure(s) and Anesthesia Type:    *Right PATELLA TENDON REPAIR - General  Surgeon(s) and Role:    Tania Ade, MD - Primary   Indications:  63 y.o. female s/p fall with right inferior pole patella fracture indicated for surgical treatment to restore extensor mechanism.     Surgeon: Isabella Stalling   Assistants: Jeanmarie Hubert PA-C [facility] was present and scrubbed throughout the procedure and was essential in positioning, retraction, exposure, and closure)  Anesthesia: General endotracheal anesthesia      Procedure Detail  PATELLA TENDON REPAIR  Findings: The fracture did not extend into the articular service and was a very small distal pole fracture.  The fragment was only a few millimeters and would not hold any fixation.  Therefore the fragment was excised and the patellar tendon was repaired to the inferior defect using suture tunnels.  Estimated Blood Loss:  less than 50 mL         Drains: none  Blood Given: none         Specimens: none        Complications:  * No complications entered in OR log *         Disposition: PACU - hemodynamically stable.         Condition: stable    Procedure:  The patient was identified in the preoperative  holding area where I personally marked the operative site after  verifying site side and procedure with the patient. The patient was taken back  to the operating room where general anesthesia was induced without  Complication. The patient was placed in supine position  After the appropriate time-out, the limb was exsanguinated and the tourniquet was elevated to 350 mmHg.   A approximately 8 cm incision was made anteriorly over the right knee.  Dissection was carried down to the fracture site.  The retinaculum was disrupted completely.  The inferior pole fracture was identified.  The articular cartilage was  noted to be intact and the fracture did not extend into the articular cartilage. The inferior fragment was extremely small involving only a few millimeters of bone.  The decision was made to resect this bone to repair the patellar tendon directly into the bony defect on the superior fragment. The joint was irrigated.  2 suture tapes were used in a locking Krakw configuration through the patellar tendon creating 4 strands for the repair.  3 parallel drill holes were made through the inferior defect on the patella coming out superiorly.  4 suture strands were passed through these 3 bone tunnels and the tendon was held reduced to the fracture while these were each tied securely.  This created a nice anatomic repair.  The remainder of the medial lateral retinaculum was then repaired with 0 Vicryl.  Again some irrigation was used.  The wound was then closed in layers with 2-0 Vicryl and staples for skin closure.  Patient was then placed in a light sterile dressing and a knee immobilizer.  She was allowed to awaken from anesthesia transferred to the stretcher and taken to the recovery room in stable condition.   POSTOPERATIVE PLAN: We will allow weightbearing as tolerated with the knee in full extension in a brace.  She will not have any flexion until about 4 weeks once things have had a chance to heal.  She will be readmitted to the medical service for medical management and  will need physical therapy.

## 2019-01-15 NOTE — Anesthesia Postprocedure Evaluation (Signed)
Anesthesia Post Note  Patient: Kimberla Driskill  Procedure(s) Performed: PATELLA TENDON REPAIR (N/A Knee)     Patient location during evaluation: PACU Anesthesia Type: General Level of consciousness: awake and alert Pain management: pain level controlled Vital Signs Assessment: post-procedure vital signs reviewed and stable Respiratory status: spontaneous breathing, nonlabored ventilation, respiratory function stable and patient connected to nasal cannula oxygen Cardiovascular status: blood pressure returned to baseline and stable Postop Assessment: no apparent nausea or vomiting Anesthetic complications: no    Last Vitals:  Vitals:   01/15/19 1530 01/15/19 1551  BP: 137/78 (!) 123/95  Pulse: 90 87  Resp: 18 18  Temp:  36.5 C  SpO2: 96% 92%    Last Pain:  Vitals:   01/15/19 1600  TempSrc:   PainSc: 8                  Montez Hageman

## 2019-01-15 NOTE — Interval H&P Note (Signed)
History and Physical Interval Note:  01/15/2019 1:08 PM  [name]  has presented today for surgery, with the diagnosis of patella tendon rupture right.  The various methods of treatment have been discussed with the patient and family. After consideration of risks, benefits and other options for treatment, the patient has consented to  Procedure(s): PATELLA TENDON REPAIR (N/A) as a surgical intervention.  The patient's history has been reviewed, patient examined, no change in status, stable for surgery.  I have reviewed the patient's chart and labs.  Questions were answered to the patient's satisfaction.     [name]

## 2019-01-15 NOTE — H&P (View-Only) (Signed)
Reason for Consult: right leg injury Referring Physician: Dr. Gilford Raid, [facility] [name] is an 63 y.o. female.  HPI: Slipped and fell on water in bathroom directly on right knee this morning. Unable to bear weight on right lower extremity. History of prior stroke 20years ago with residual L LE weakness. Takes asa, no other anticoagulation. Has been NPO since this am.   Past Medical History:  Diagnosis Date  . Colon cancer ([place])   . Diabetes mellitus   . Stroke [facility])     Past Surgical History:  Procedure Laterality Date  . CHOLECYSTECTOMY    . COLON SURGERY    . ESOPHAGOGASTRODUODENOSCOPY  11/07/2011   Procedure: ESOPHAGOGASTRODUODENOSCOPY (EGD);  Surgeon: Jerene Bears, MD;  Location: [facility];  Service: Gastroenterology;  Laterality: N/A;    Family History  Problem Relation Age of Onset  . Cancer Other   . Diabetes Other     Social History:  reports that she has never smoked. She has never used smokeless tobacco. She reports that she does not drink alcohol or use drugs.  Allergies:  Allergies  Allergen Reactions  . Glipizide Nausea And Vomiting    REACTION: nausea and vomiting  . Ibuprofen Other (See Comments)    REACTION: tongue swells and dyspnea  . Iohexol      Code: HIVES, Desc: pt called day after IV contrast c/o red, swollen itching feet and red face that started 4hrs after ct, Onset Date: [id number]   . Rosiglitazone-Metformin     REACTION: rash--tolerates Metformin alone  . Hydrocodone-Acetaminophen Nausea And Vomiting and Rash    REACTION: vomiting and rash  . Morphine Rash    REACTION: rash  . Pantoprazole Palpitations  . Sulfamethoxazole Rash    REACTION: rash    Medications: I have reviewed the patient's current medications.  Results for orders placed or performed during the hospital encounter of 01/15/19 (from the past 48 hour(s))  Basic metabolic panel     Status: None   Collection Time: 01/15/19 10:43 AM  Result Value Ref Range   Sodium 142 135 - 145 mmol/L   Potassium 3.6 3.5 - 5.1 mmol/L   Chloride 108 98 - 111 mmol/L   CO2 26 22 - 32 mmol/L   Glucose, Bld 99 70 - 99 mg/dL   BUN 12 8 - 23 mg/dL   Creatinine, Ser 0.51 0.44 - 1.00 mg/dL   Calcium 8.9 8.9 - 10.3 mg/dL   GFR calc non Af Amer >60 >60 mL/min   GFR calc Af Amer >60 >60 mL/min   Anion gap 8 5 - 15    Comment: Performed at [facility], [place] [address], [place], [place] [address]  CBC with Differential     Status: None   Collection Time: 01/15/19 10:43 AM  Result Value Ref Range   WBC 9.8 4.0 - 10.5 K/uL   RBC 4.97 3.87 - 5.11 MIL/uL   Hemoglobin 13.3 12.0 - 15.0 g/dL   HCT 40.0 36.0 - 46.0 %   MCV 80.5 80.0 - 100.0 fL   MCH 26.8 26.0 - 34.0 pg   MCHC 33.3 30.0 - 36.0 g/dL   RDW 14.0 11.5 - 15.5 %   Platelets 216 150 - 400 K/uL   nRBC 0.0 0.0 - 0.2 %   Neutrophils Relative % 75 %   Neutro Abs 7.3 1.7 - 7.7 K/uL   Lymphocytes Relative 18 %   Lymphs Abs 1.8 0.7 - 4.0 K/uL   Monocytes  Relative 7 %   Monocytes Absolute 0.7 0.1 - 1.0 K/uL   Eosinophils Relative 0 %   Eosinophils Absolute 0.0 0.0 - 0.5 K/uL   Basophils Relative 0 %   Basophils Absolute 0.0 0.0 - 0.1 K/uL   Immature Granulocytes 0 %   Abs Immature Granulocytes 0.04 0.00 - 0.07 K/uL    Comment: Performed at [facility], [place] [address], [place], [place] [address]  SARS Coronavirus 2 [facility] order, Performed in [facility] hospital lab) Nasopharyngeal Nasopharyngeal Swab     Status: None   Collection Time: 01/15/19 10:43 AM   Specimen: Nasopharyngeal Swab  Result Value Ref Range   SARS Coronavirus 2 NEGATIVE NEGATIVE    Comment: (NOTE) If result is NEGATIVE SARS-CoV-2 target nucleic acids are NOT DETECTED. The SARS-CoV-2 RNA is generally detectable in upper and lower  respiratory specimens during the acute phase of infection. The lowest  concentration of SARS-CoV-2 viral copies this assay can detect is 250  copies / mL. A negative result  does not preclude SARS-CoV-2 infection  and should not be used as the sole basis for treatment or other  patient management decisions.  A negative result may occur with  improper specimen collection / handling, submission of specimen other  than nasopharyngeal swab, presence of viral mutation(s) within the  areas targeted by this assay, and inadequate number of viral copies  (<250 copies / mL). A negative result must be combined with clinical  observations, patient history, and epidemiological information. If result is POSITIVE SARS-CoV-2 target nucleic acids are DETECTED. The SARS-CoV-2 RNA is generally detectable in upper and lower  respiratory specimens dur ing the acute phase of infection.  Positive  results are indicative of active infection with SARS-CoV-2.  Clinical  correlation with patient history and other diagnostic information is  necessary to determine patient infection status.  Positive results do  not rule out bacterial infection or co-infection with other viruses. If result is PRESUMPTIVE POSTIVE SARS-CoV-2 nucleic acids MAY BE PRESENT.   A presumptive positive result was obtained on the submitted specimen  and confirmed on repeat testing.  While 2019 novel coronavirus  (SARS-CoV-2) nucleic acids may be present in the submitted sample  additional confirmatory testing may be necessary for epidemiological  and / or clinical management purposes  to differentiate between  SARS-CoV-2 and other Sarbecovirus currently known to infect humans.  If clinically indicated additional testing with an alternate test  methodology [phone]) is advised. The SARS-CoV-2 RNA is generally  detectable in upper and lower respiratory sp ecimens during the acute  phase of infection. The expected result is Negative. Fact Sheet for Patients:  StrictlyIdeas.no Fact Sheet for Healthcare Providers: BankingDealers.co.za This test is not yet approved or  cleared by the [place] FDA and has been authorized for detection and/or diagnosis of SARS-CoV-2 by FDA under an Emergency Use Authorization (EUA).  This EUA will remain in effect (meaning this test can be used) for the duration of the COVID-19 declaration under Section 564(b)(1) of the Act, 21 U.S.C. section 360bbb-3(b)(1), unless the authorization is terminated or revoked sooner. Performed at [facility], [place] [address] [place], [place] [address]     Ct Head Wo Contrast  Result Date: 01/15/2019 CLINICAL DATA:  Headache.  Posttraumatic.  Slipped and hit head. EXAM: CT HEAD WITHOUT CONTRAST TECHNIQUE: Contiguous axial images were obtained from the base of the skull through the vertex without intravenous contrast. COMPARISON:  Head CT 08/17/2010 FINDINGS: Brain: No evidence  for acute hemorrhage, mass lesion, midline shift, hydrocephalus or large infarct. Small amount of mineralization in the basal ganglia region. Vascular: No hyperdense vessel or unexpected calcification. Skull: Scalp hematoma along the left posterior parietal region near the vertex. Negative for a calvarial fracture. Sinuses/Orbits: Visualized paranasal sinuses are clear. Other: None. IMPRESSION: 1. No acute intracranial abnormality. 2. Left posterior scalp hematoma.  No underlying fracture. Electronically Signed   By: Markus Daft M.D.   On: 01/15/2019 08:26   Dg Knee Complete 4 Views Right  Result Date: 01/15/2019 CLINICAL DATA:  [place] in bathroom. Right knee pain and swelling. Initial encounter. EXAM: RIGHT KNEE - COMPLETE 4+ VIEW COMPARISON:  None. FINDINGS: A displaced fracture through the inferior aspect of the patella is seen. Marked prepatellar soft tissue swelling is seen but there is no evidence of joint effusion. Nondisplaced fracture of proximal fibula is also noted. IMPRESSION: 1. Displaced fracture through inferior patella. 2. Nondisplaced proximal fibular fracture. Electronically Signed   By:  Marlaine Hind M.D.   On: 01/15/2019 08:27    Review of Systems  Musculoskeletal: Positive for falls, joint pain and myalgias.  Neurological: Positive for weakness.       L sided weakness from previous stroke  All other systems reviewed and are negative.  Blood pressure 126/65, pulse 92, temperature 97.7 F (36.5 C), temperature source Oral, resp. rate 18, SpO2 96 %. Physical Exam  Constitutional: She is oriented to person, place, and time. She appears well-developed and well-nourished.  HENT:  Head: Normocephalic and atraumatic.  Eyes: EOM are normal.  Neck: Normal range of motion.  Cardiovascular: Intact distal pulses.  Respiratory: Effort normal.  Musculoskeletal:     Comments: R knee effusion and ecchymosis, diffusely TTP ROM limited by pain Unable to extend lower leg/straight leg raise Distally NVI  Neurological: She is alert and oriented to person, place, and time.  Skin: Skin is warm and dry.  Psychiatric: She has a normal mood and affect. Her behavior is normal. Judgment and thought content normal.    Assessment/Plan: Right displaced fracture inferior patella, stable nondisplaced prox fibular fx Will plan on patellar tendon repair today NPO NWB Will admit after surgery  Grier Mitts 01/15/2019, 12:13 PM

## 2019-01-15 NOTE — Anesthesia Procedure Notes (Signed)
Procedure Name: LMA Insertion Date/Time: 01/15/2019 1:24 PM Performed by: Lavina Hamman, CRNA Pre-anesthesia Checklist: Patient identified, Emergency Drugs available, Suction available and Patient being monitored Patient Re-evaluated:Patient Re-evaluated prior to induction Oxygen Delivery Method: Circle System Utilized Preoxygenation: Pre-oxygenation with 100% oxygen Induction Type: IV induction Ventilation: Mask ventilation without difficulty LMA: LMA inserted LMA Size: 4.0 Number of attempts: 1 Airway Equipment and Method: Bite block Placement Confirmation: positive ETCO2 Tube secured with: Tape Dental Injury: Teeth and Oropharynx as per pre-operative assessment

## 2019-01-15 NOTE — H&P (Signed)
History and Physical    [name] [place] CLE:[id number] DOB: 02-24-1956 DOA: 01/15/2019  PCP: Patient, No Pcp Per  Patient coming from: home  I have personally briefly reviewed patient's old medical records in [place]  Chief Complaint: R knee pain  HPI: [name] is a 63 y.o. female with medical history significant of T2DM, CVA, colon cancer s/p surgery and chemo, Hx TB s/p treatment presenting after mechanical fall.  Pt with hx of stroke and has L sided weakness.  She was in the bathroom and thinks the floor was wet.  She slipped, hitting her R knee on the ground as well as her head.  She denies LOC.  She takes aspirin, but no other blood thinners.  No fevers, CP, SOB, abdominal pain, N/V, diarrhea.  She denies smoking. Denies etoh.  ED Course: Labs, imaging.  Ortho c/s.  Hospitalist to admit.  Review of Systems: As per HPI otherwise 10 point review of systems negative.   Past Medical History:  Diagnosis Date  . Colon cancer ([place])   . Diabetes mellitus   . Stroke [facility])     Past Surgical History:  Procedure Laterality Date  . CHOLECYSTECTOMY    . COLON SURGERY    . ESOPHAGOGASTRODUODENOSCOPY  11/07/2011   Procedure: ESOPHAGOGASTRODUODENOSCOPY (EGD);  Surgeon: Jenna Bears, MD;  Location: [facility];  Service: Gastroenterology;  Laterality: N/A;     reports that she has never smoked. She has never used smokeless tobacco. She reports that she does not drink alcohol or use drugs.  Allergies  Allergen Reactions  . Glipizide Nausea And Vomiting    REACTION: nausea and vomiting  . Ibuprofen Other (See Comments)    REACTION: tongue swells and dyspnea  . Iohexol      Code: HIVES, Desc: pt called day after IV contrast c/o red, swollen itching feet and red face that started 4hrs after ct, Onset Date: [id number]   . Rosiglitazone-Metformin     REACTION: rash--tolerates Metformin alone  . Hydrocodone-Acetaminophen Nausea And Vomiting and Rash   REACTION: vomiting and rash  . Morphine Rash    REACTION: rash  . Pantoprazole Palpitations  . Sulfamethoxazole Rash    REACTION: rash    Family History  Problem Relation Age of Onset  . Cancer Other   . Diabetes Other    Prior to Admission medications   Medication Sig Start Date End Date Taking? Authorizing Provider  acetaminophen (TYLENOL) 500 MG tablet Take 1 tablet (500 mg total) by mouth every 6 (six) hours as needed. 08/13/14  Yes Domenic Moras, PA-C  aspirin EC 81 MG tablet Take 81 mg by mouth daily.   Yes [provider]  EPINEPHrine 0.3 mg/0.3 mL IJ SOAJ injection Inject 0.3 mLs (0.3 mg total) into the muscle as needed (Tongue swelling, anaphylaxis). 08/16/14  Yes Evelina Bucy, MD  insulin aspart (NOVOLOG) 100 UNIT/ML injection Inject 15-20 Units into the skin 2 (two) times daily. Takes 20 units every night and 15 units every morning   Yes [provider]  insulin detemir (LEVEMIR) 100 UNIT/ML injection Inject 100 Units into the skin daily.   Yes [provider]  PRESCRIPTION MEDICATION Take 1 tablet by mouth daily. Cholesterol med, pharmacy has no record and pt doesn't know the name of it.   Yes [provider]    Physical Exam: Vitals:   01/15/19 0702 01/15/19 0901 01/15/19 1138  BP: (!) 152/95 127/62 126/65  Pulse: (!) 116 99 92  Resp: 16 18 18   Temp: 98.1 F (36.7 C)  97.7 F (36.5 C)  TempSrc: Oral  Oral  SpO2: 100% 97% 96%    Constitutional: NAD, calm, comfortable Vitals:   01/15/19 0702 01/15/19 0901 01/15/19 1138  BP: (!) 152/95 127/62 126/65  Pulse: (!) 116 99 92  Resp: 16 18 18   Temp: 98.1 F (36.7 C)  97.7 F (36.5 C)  TempSrc: Oral  Oral  SpO2: 100% 97% 96%   Eyes: PERRL, lids and conjunctivae normal ENMT: Mucous membranes are moist. Posterior pharynx clear of any exudate or lesions.Normal dentition.  Neck: normal, supple, no masses, no thyromegaly Respiratory: clear to auscultation bilaterally, no wheezing, no  crackles. Normal respiratory effort. No accessory muscle use.  Cardiovascular: Regular rate and rhythm, no murmurs / rubs / gallops. No extremity edema. 2+ pedal pulses.  Abdomen: no tenderness, no masses palpated. No hepatosplenomegaly.  Musculoskeletal: R knee swelling and echymosis Skin: no rashes, lesions, ulcers. No induration Neurologic: CN 2-12 notable for mild L sided facial droop. Sensation intact. Moving all extremities.  L sided weakness from stroke and mild L sided facial droop. Psychiatric: Normal judgment and insight. Alert and oriented x 3. Normal mood.   Labs on Admission: I have personally reviewed following labs and imaging studies  CBC: Recent Labs  Lab 01/15/19 1043  WBC 9.8  NEUTROABS 7.3  HGB 13.3  HCT 40.0  MCV 80.5  PLT 416   Basic Metabolic Panel: Recent Labs  Lab 01/15/19 1043  NA 142  K 3.6  CL 108  CO2 26  GLUCOSE 99  BUN 12  CREATININE 0.51  CALCIUM 8.9   GFR: CrCl cannot be calculated (Unknown ideal weight.). Liver Function Tests: No results for input(s): AST, ALT, ALKPHOS, BILITOT, PROT, ALBUMIN in the last 168 hours. No results for input(s): LIPASE, AMYLASE in the last 168 hours. No results for input(s): AMMONIA in the last 168 hours. Coagulation Profile: No results for input(s): INR, PROTIME in the last 168 hours. Cardiac Enzymes: No results for input(s): CKTOTAL, CKMB, CKMBINDEX, TROPONINI in the last 168 hours. BNP (last 3 results) No results for input(s): PROBNP in the last 8760 hours. HbA1C: No results for input(s): HGBA1C in the last 72 hours. CBG: No results for input(s): GLUCAP in the last 168 hours. Lipid Profile: No results for input(s): CHOL, HDL, LDLCALC, TRIG, CHOLHDL, LDLDIRECT in the last 72 hours. Thyroid Function Tests: No results for input(s): TSH, T4TOTAL, FREET4, T3FREE, THYROIDAB in the last 72 hours. Anemia Panel: No results for input(s): VITAMINB12, FOLATE, FERRITIN, TIBC, IRON, RETICCTPCT in the last 72  hours. Urine analysis:    Component Value Date/Time   COLORURINE YELLOW 11/28/2016 0113   APPEARANCEUR HAZY (A) 11/28/2016 0113   LABSPEC 1.015 12/17/2017 1623   PHURINE 6.0 12/17/2017 1623   GLUCOSEU 500 (A) 12/17/2017 1623   HGBUR NEGATIVE 12/17/2017 1623   HGBUR negative 04/02/2010 1013   BILIRUBINUR NEGATIVE 12/17/2017 1623   KETONESUR 15 (A) 12/17/2017 1623   PROTEINUR NEGATIVE 12/17/2017 1623   UROBILINOGEN 0.2 12/17/2017 1623   NITRITE POSITIVE (A) 12/17/2017 1623   LEUKOCYTESUR NEGATIVE 12/17/2017 1623    Radiological Exams on Admission: Ct Head Wo Contrast  Result Date: 01/15/2019 CLINICAL DATA:  Headache.  Posttraumatic.  Slipped and hit head. EXAM: CT HEAD WITHOUT CONTRAST TECHNIQUE: Contiguous axial images were obtained from the base of the skull through the vertex without intravenous contrast. COMPARISON:  Head CT 08/17/2010 FINDINGS: Brain: No evidence for acute hemorrhage, mass lesion, midline  shift, hydrocephalus or large infarct. Small amount of mineralization in the basal ganglia region. Vascular: No hyperdense vessel or unexpected calcification. Skull: Scalp hematoma along the left posterior parietal region near the vertex. Negative for a calvarial fracture. Sinuses/Orbits: Visualized paranasal sinuses are clear. Other: None. IMPRESSION: 1. No acute intracranial abnormality. 2. Left posterior scalp hematoma.  No underlying fracture. Electronically Signed   By: Markus Daft M.D.   On: 01/15/2019 08:26   Dg Chest Port 1 View  Result Date: 01/15/2019 CLINICAL DATA:  Preop, knee surgery today. EXAM: PORTABLE CHEST 1 VIEW COMPARISON:  Chest x-ray dated 08/10/2014. FINDINGS: LEFT chest wall Port-A-Cath in place with tip positioned at the level of the upper SVC. Heart size and mediastinal contours are within normal limits. Stable mild scarring/fibrosis within the RIGHT upper lung. No confluent opacity to suggest a developing pneumonia. No pleural effusion seen. Osseous structures  about the chest are unremarkable. IMPRESSION: No active disease. Electronically Signed   By: Franki Cabot M.D.   On: 01/15/2019 12:17   Dg Knee Complete 4 Views Right  Result Date: 01/15/2019 CLINICAL DATA:  [place] in bathroom. Right knee pain and swelling. Initial encounter. EXAM: RIGHT KNEE - COMPLETE 4+ VIEW COMPARISON:  None. FINDINGS: A displaced fracture through the inferior aspect of the patella is seen. Marked prepatellar soft tissue swelling is seen but there is no evidence of joint effusion. Nondisplaced fracture of proximal fibula is also noted. IMPRESSION: 1. Displaced fracture through inferior patella. 2. Nondisplaced proximal fibular fracture. Electronically Signed   By: Marlaine Hind M.D.   On: 01/15/2019 08:27    EKG: Independently reviewed. Sinus rhythm.  T wave inversion in III, appears isolated, as well as lead V3.  Assessment/Plan Active Problems:   Patellar fracture  Right Patellar Fracture  Proximal Fibular Fracture  Mechanical Fall:  Head CT with L posterior scalp hematoma, she did not lose consciousness Orthopedics planning for surgery today RCRI is at least 2 for T2DM and hx CVA.  10% risk of MACE.  Discussed risk/benefits with pt.  Planning to proceed with surgery. Continue scheduled tylenol She's got listed allergy to ibuprofen (tongue swelling, SOB), hydrocodone (N/V, rash), and morphine (rash).  Discussed with pharmacy, pt has had oxycodone before in our system and seems to have tolerated this.  Can consider trial of fentanyl/dilaudid.  Type 2 Diabetes:  She takes 15 units levemir daily with 30 units novolog twice daily Will plan for 10 units levemir here with SSI  Hx CVA with L sided weakness:  Pt on ASA, statin (she's unclear which). Will give pravastatin 40 for now here and follow lipid panel.  Follow final med rec.    Hx Colon Cancer: s/p chemo/resection, follow outpatient   Hx GI Bleed ini 2013: with gastritis on endoscopy then.  She's no longer taking  PPI (has allergy listed to protonix - palpitations).    Hx TB s/p treatment  Please follow final med rec to ensure it has been updated  DVT prophylaxis: SCD Code Status: full  Family Communication: none at bedside (pt states her son coming, asked her to let us know if any questions)  Disposition Plan: pending  Consults called: orthopedics Admission status: obs   Fayrene Helper MD [facility] Pager AMION  If 7PM-7AM, please contact night-coverage [email or web address] Password [facility]  01/15/2019, 12:24 PM

## 2019-01-15 NOTE — Plan of Care (Signed)
Some PO Nausea.  Vomited once this shift.

## 2019-01-16 ENCOUNTER — Encounter (HOSPITAL_COMMUNITY): Payer: Self-pay | Admitting: Orthopedic Surgery

## 2019-01-16 DIAGNOSIS — Z7982 Long term (current) use of aspirin: Secondary | ICD-10-CM | POA: Diagnosis not present

## 2019-01-16 DIAGNOSIS — Z888 Allergy status to other drugs, medicaments and biological substances status: Secondary | ICD-10-CM | POA: Diagnosis not present

## 2019-01-16 DIAGNOSIS — I10 Essential (primary) hypertension: Secondary | ICD-10-CM | POA: Diagnosis present

## 2019-01-16 DIAGNOSIS — I69354 Hemiplegia and hemiparesis following cerebral infarction affecting left non-dominant side: Secondary | ICD-10-CM | POA: Diagnosis not present

## 2019-01-16 DIAGNOSIS — D6832 Hemorrhagic disorder due to extrinsic circulating anticoagulants: Secondary | ICD-10-CM | POA: Diagnosis present

## 2019-01-16 DIAGNOSIS — W010XXA Fall on same level from slipping, tripping and stumbling without subsequent striking against object, initial encounter: Secondary | ICD-10-CM | POA: Diagnosis present

## 2019-01-16 DIAGNOSIS — Z794 Long term (current) use of insulin: Secondary | ICD-10-CM

## 2019-01-16 DIAGNOSIS — S82831A Other fracture of upper and lower end of right fibula, initial encounter for closed fracture: Secondary | ICD-10-CM

## 2019-01-16 DIAGNOSIS — T45515A Adverse effect of anticoagulants, initial encounter: Secondary | ICD-10-CM | POA: Diagnosis present

## 2019-01-16 DIAGNOSIS — Z833 Family history of diabetes mellitus: Secondary | ICD-10-CM | POA: Diagnosis not present

## 2019-01-16 DIAGNOSIS — Z01818 Encounter for other preprocedural examination: Secondary | ICD-10-CM | POA: Diagnosis present

## 2019-01-16 DIAGNOSIS — E1165 Type 2 diabetes mellitus with hyperglycemia: Secondary | ICD-10-CM | POA: Diagnosis present

## 2019-01-16 DIAGNOSIS — Z8611 Personal history of tuberculosis: Secondary | ICD-10-CM | POA: Diagnosis not present

## 2019-01-16 DIAGNOSIS — S82001A Unspecified fracture of right patella, initial encounter for closed fracture: Secondary | ICD-10-CM | POA: Diagnosis present

## 2019-01-16 DIAGNOSIS — I69359 Hemiplegia and hemiparesis following cerebral infarction affecting unspecified side: Secondary | ICD-10-CM

## 2019-01-16 DIAGNOSIS — Z885 Allergy status to narcotic agent status: Secondary | ICD-10-CM | POA: Diagnosis not present

## 2019-01-16 DIAGNOSIS — Z886 Allergy status to analgesic agent status: Secondary | ICD-10-CM | POA: Diagnosis not present

## 2019-01-16 DIAGNOSIS — Z79899 Other long term (current) drug therapy: Secondary | ICD-10-CM | POA: Diagnosis not present

## 2019-01-16 DIAGNOSIS — E119 Type 2 diabetes mellitus without complications: Secondary | ICD-10-CM

## 2019-01-16 DIAGNOSIS — S0003XA Contusion of scalp, initial encounter: Secondary | ICD-10-CM | POA: Diagnosis present

## 2019-01-16 DIAGNOSIS — Z9221 Personal history of antineoplastic chemotherapy: Secondary | ICD-10-CM | POA: Diagnosis not present

## 2019-01-16 DIAGNOSIS — Z20828 Contact with and (suspected) exposure to other viral communicable diseases: Secondary | ICD-10-CM | POA: Diagnosis present

## 2019-01-16 DIAGNOSIS — Z85038 Personal history of other malignant neoplasm of large intestine: Secondary | ICD-10-CM | POA: Diagnosis not present

## 2019-01-16 LAB — COMPREHENSIVE METABOLIC PANEL
ALT: 15 U/L (ref 0–44)
AST: 18 U/L (ref 15–41)
Albumin: 3.3 g/dL — ABNORMAL LOW (ref 3.5–5.0)
Alkaline Phosphatase: 84 U/L (ref 38–126)
Anion gap: 10 (ref 5–15)
BUN: 15 mg/dL (ref 8–23)
CO2: 22 mmol/L (ref 22–32)
Calcium: 8.2 mg/dL — ABNORMAL LOW (ref 8.9–10.3)
Chloride: 107 mmol/L (ref 98–111)
Creatinine, Ser: 0.55 mg/dL (ref 0.44–1.00)
GFR calc Af Amer: >60 mL/min (ref >60)
GFR calc non Af Amer: >60 mL/min (ref >60)
Glucose, Bld: 251 mg/dL — ABNORMAL HIGH (ref 70–99)
Potassium: 3.8 mmol/L (ref 3.5–5.1)
Sodium: 139 mmol/L (ref 135–145)
Total Bilirubin: 1 mg/dL (ref 0.3–1.2)
Total Protein: 6.3 g/dL — ABNORMAL LOW (ref 6.5–8.1)

## 2019-01-16 LAB — GLUCOSE, CAPILLARY
Glucose-Capillary: 230 mg/dL — ABNORMAL HIGH (ref 70–99)
Glucose-Capillary: 228 mg/dL — ABNORMAL HIGH (ref 70–99)
Glucose-Capillary: 188 mg/dL — ABNORMAL HIGH (ref 70–99)
Glucose-Capillary: 220 mg/dL — ABNORMAL HIGH (ref 70–99)

## 2019-01-16 LAB — CBC
HCT: 36.3 % (ref 36.0–46.0)
Hemoglobin: 11.7 g/dL — ABNORMAL LOW (ref 12.0–15.0)
MCH: 26.5 pg (ref 26.0–34.0)
MCHC: 32.2 g/dL (ref 30.0–36.0)
MCV: 82.3 fL (ref 80.0–100.0)
Platelets: 193 10*3/uL (ref 150–400)
RBC: 4.41 MIL/uL (ref 3.87–5.11)
RDW: 13.9 % (ref 11.5–15.5)
WBC: 8.6 10*3/uL (ref 4.0–10.5)
nRBC: 0 % (ref 0.0–0.2)

## 2019-01-16 LAB — HIV ANTIBODY (ROUTINE TESTING W REFLEX): HIV Screen 4th Generation wRfx: NONREACTIVE

## 2019-01-16 MED ORDER — HEPARIN SODIUM (PORCINE) 5000 UNIT/ML IJ SOLN
5000.0000 [IU] | Freq: Three times a day (TID) | INTRAMUSCULAR | Status: DC
  Administered 2019-01-16 – 2019-01-17 (×5): 5000 [IU] via SUBCUTANEOUS
  Filled 2019-01-16 (×5): qty 1

## 2019-01-16 MED ORDER — INSULIN ASPART 100 UNIT/ML ~~LOC~~ SOLN
0.0000 [IU] | Freq: Three times a day (TID) | SUBCUTANEOUS | Status: DC
  Administered 2019-01-16: 5 [IU] via SUBCUTANEOUS
  Administered 2019-01-16: 3 [IU] via SUBCUTANEOUS
  Administered 2019-01-16: 5 [IU] via SUBCUTANEOUS
  Administered 2019-01-17: 3 [IU] via SUBCUTANEOUS
  Administered 2019-01-17 (×2): 5 [IU] via SUBCUTANEOUS

## 2019-01-16 MED ORDER — INSULIN ASPART 100 UNIT/ML ~~LOC~~ SOLN
3.0000 [IU] | Freq: Three times a day (TID) | SUBCUTANEOUS | Status: DC
  Administered 2019-01-16 – 2019-01-17 (×5): 3 [IU] via SUBCUTANEOUS

## 2019-01-16 MED ORDER — INSULIN DETEMIR 100 UNIT/ML ~~LOC~~ SOLN
14.0000 [IU] | Freq: Every day | SUBCUTANEOUS | Status: DC
  Administered 2019-01-16 – 2019-01-17 (×2): 14 [IU] via SUBCUTANEOUS
  Filled 2019-01-16 (×2): qty 0.14

## 2019-01-16 MED ORDER — PROMETHAZINE HCL 25 MG/ML IJ SOLN
12.5000 mg | Freq: Once | INTRAMUSCULAR | Status: AC
  Administered 2019-01-16: 02:00:00 12.5 mg via INTRAVENOUS
  Filled 2019-01-16: qty 1

## 2019-01-16 NOTE — Progress Notes (Signed)
Pt is having Nausea and Vomiting unrelieved with Zofran. On Call Provider is paged and new orders received.

## 2019-01-16 NOTE — Progress Notes (Signed)
   PATIENT ID: [name]   1 Day Post-Op Procedure(s) (LRB): PATELLA TENDON REPAIR (N/A)  Subjective: Doing well this am. Pain controlled. No other complaints or concerns.   Objective:  Vitals:   01/16/19 0115 01/16/19 0455  BP: 129/77 109/67  Pulse: 93 91  Resp: 16 14  Temp: 97.8 F (36.6 C) 98.4 F (36.9 C)  SpO2: 94% 93%     R LE dressing c/d/i Knee immobilizer in place Wiggles toes, distally NVI Calves soft, nontender  Labs:  Recent Labs    01/15/19 1043 01/16/19 0259  HGB 13.3 11.7*   Recent Labs    01/15/19 1043 01/16/19 0259  WBC 9.8 8.6  RBC 4.97 4.41  HCT 40.0 36.3  PLT 216 193   Recent Labs    01/15/19 1043 01/16/19 0259  NA 142 139  K 3.6 3.8  CL 108 107  CO2 26 22  BUN 12 15  CREATININE 0.51 0.55  GLUCOSE 99 251*  CALCIUM 8.9 8.2*    Assessment and Plan: 1 day s/p R patellar tendon repair Due to L LE weakness s/p stroke, okay for WBAT R LE in hinged knee brace locked in full extension. [employer] knee brace ordered Up with PT today Fu w Dr. Tamera Punt in 1 week Appreciate hospitalist service in providing primary care for this patient  VTE proph: asa, scd

## 2019-01-16 NOTE — Evaluation (Signed)
Physical Therapy Evaluation Patient Details Name: [name] MRN: [id number] DOB: Jun 03, 1955 Today's Date: 01/16/2019   History of Present Illness  63 year old female with history of type 2 diabetes mellitus, history of prior CVA with left-sided weakness, colon cancer status post surgery/chemo, history of TB status post treatment presented after a mechanical fall.  Patient reported she was in the bathroom and floor was wet, slipped hitting her right knee on the floor as well as her head, no LOC.  right inferior pole patella fracture s/p surgery  to restore extensor mechanism.  Clinical Impression  Pt admitted with above diagnosis.  Pt with limited mobility d/t RLE pain and L sided weakness d/t CVA in past. Pt lives with son and he works during the day.  Pt will likely need SNF post acute. Will follow in acute setting   Pt currently with functional limitations due to the deficits listed below (see PT Problem List). Pt will benefit from skilled PT to increase their independence and safety with mobility to allow discharge to the venue listed below.       Follow Up Recommendations SNF    Equipment Recommendations  None recommended by PT    Recommendations for Other Services       Precautions / Restrictions Precautions Precautions: Fall Required Braces or Orthoses: Other Brace Other Brace: hinged knee brace at zero degrees Restrictions Weight Bearing Restrictions: No Other Position/Activity Restrictions: WBAT in brace      Mobility  Bed Mobility Overal bed mobility: Needs Assistance Bed Mobility: Supine to Sit     Supine to sit: Mod assist     General bed mobility comments: assist with RLE, trunk, bed pad utilized to scoot  Transfers Overall transfer level: Needs assistance Equipment used: Rolling walker (2 wheeled) Transfers: Sit to/from [facility] Sit to Stand: Mod assist;+2 physical assistance;+2 safety/equipment;From elevated surface Stand  pivot transfers: Max assist;+2 physical assistance;+2 safety/equipment       General transfer comment: sit to stand x2, assist with anterior-superior wt stranslation. unable to maintain standing d/t pain on initial attempt. +2 assist needed to stand and pivot to chair, chair pulled to pt to prevent fall.  Ambulation/Gait             General Gait Details: unable d/t L sided weakness and RLE pain  Stairs            Wheelchair Mobility    Modified Rankin (Stroke Patients Only)       Balance Overall balance assessment: Needs assistance Sitting-balance support: Feet supported;Single extremity supported Sitting balance-Leahy Scale: Fair       Standing balance-Leahy Scale: Zero                               Pertinent Vitals/Pain Pain Assessment: 0-10 Pain Score: 6  Pain Location: RLE/knee Pain Descriptors / Indicators: Sore;Guarding;Grimacing Pain Intervention(s): Limited activity within patient's tolerance;Monitored during session;Premedicated before session;Repositioned;Ice applied    Home Living Family/patient expects to be discharged to:: Private residence Living Arrangements: Children(son) Available Help at Discharge: Family Type of Home: Apartment Home Access: Level entry     Home Layout: One level Home Equipment: None Additional Comments: son works 5a to 1p    Prior Function Level of Independence: Independent               Hand Dominance        Extremity/Trunk Assessment   Upper Extremity Assessment Upper Extremity  Assessment: RUE deficits/detail RUE Deficits / Details: flexor tone, AAROM elbow and shoulder grossly WFL, grip fair    Lower Extremity Assessment Lower Extremity Assessment: RLE deficits/detail;LLE deficits/detail RLE Deficits / Details: ankle AROM WFL. hinged knee brace in place locked at zero degrees extension RLE: Unable to fully assess due to immobilization;Unable to fully assess due to pain LLE Deficits /  Details: ankle d/f 2/5, knee and hip grossly 3 to 3+/5 LLE Coordination: decreased fine motor;decreased gross motor       Communication   Communication: No difficulties  Cognition Arousal/Alertness: Awake/alert Behavior During Therapy: WFL for tasks assessed/performed Overall Cognitive Status: Within Functional Limits for tasks assessed                                        General Comments      Exercises     Assessment/Plan    PT Assessment Patient needs continued PT services  PT Problem List Decreased strength;Decreased range of motion;Decreased activity tolerance;Decreased balance;Pain;Decreased knowledge of use of DME;Decreased mobility;Decreased coordination       PT Treatment Interventions DME instruction;Gait training;Functional mobility training;Therapeutic activities;Therapeutic exercise;Patient/family education    PT Goals (Current goals can be found in the Care Plan section)  Acute Rehab PT Goals PT Goal Formulation: With patient Time For Goal Achievement: 01/30/19 Potential to Achieve Goals: Good    Frequency Min 3X/week   Barriers to discharge Decreased caregiver support if home/unable to go SNF    Co-evaluation               AM-PAC PT "6 Clicks" Mobility  Outcome Measure Help needed turning from your back to your side while in a flat bed without using bedrails?: A Lot Help needed moving from lying on your back to sitting on the side of a flat bed without using bedrails?: A Lot Help needed moving to and from a bed to a chair (including a wheelchair)?: Total Help needed standing up from a chair using your arms (e.g., wheelchair or bedside chair)?: A Lot Help needed to walk in hospital room?: Total Help needed climbing 3-5 steps with a railing? : Total 6 Click Score: 9    End of Session Equipment Utilized During Treatment: Gait belt;Other (comment)(RLE brace) Activity Tolerance: Patient limited by fatigue;Patient limited by  pain Patient left: in chair;with call bell/phone within reach Nurse Communication: Mobility status PT Visit Diagnosis: Difficulty in walking, not elsewhere classified (R26.2);History of falling (Z91.81);Hemiplegia and hemiparesis Hemiplegia - Right/Left: Left Hemiplegia - caused by: Cerebral infarction    Time: 9326-7124 PT Time Calculation (min) (ACUTE ONLY): 23 min   Charges:   PT Evaluation $PT Eval Low Complexity: 1 Low PT Treatments $Therapeutic Activity: 8-22 mins        Kenyon Ana, PT  Pager: [phone] Acute Rehab Dept [facility]): [phone]   01/16/2019   [facility] 01/16/2019, 11:10 AM

## 2019-01-16 NOTE — Progress Notes (Addendum)
[facility]                                                                              Patient Demographics  Ana Woodroof, is a 63 y.o. female, DOB - 1955/06/24, ALP:[id number]  Admit date - 01/15/2019   Admitting Physician A Melven Sartorius., MD  Outpatient Primary MD for the patient is Patient, No Pcp Per  Outpatient specialists:   LOS - 0  days   Medical records reviewed and are as summarized below:    Chief Complaint  Patient presents with   Fall   Knee Injury   Head Injury       Brief summary   Patient is a 63 year old female with history of type 2 diabetes mellitus, history of prior CVA with left-sided weakness, colon cancer status post surgery, chemo history of TB status post treatment presented after a mechanical fall.  Patient reported she was in the bathroom and floor was wet, slipped hitting her right knee on the floor as well as her head.  Denied any loss of consciousness.  Does not take any blood thinners. X-ray of the right knee showed displaced fracture through the inferior patella, nondisplaced proximal tubular fracture Orthopedics was consulted, patient was admitted for further work-up COVID-19 test negative   Assessment & Plan    Principal Problem: Right patellar fracture, proximal fibula fracture, mechanical fall -CT head with left posterior scalp hematoma, did not lose consciousness -Orthopedics was consulted, patient underwent right patellar tendon repair on 8/15, postop day #1 -Continue pain management, DVT prophylaxis per orthopedics.  Added bowel regimen. -PT evaluation today  -States still having right knee pain 6/10 at rest, has not ambulated yet, continue oxycodone as needed  Addendum: 12:50 PM Patient with functional limitations on PT evaluation, postop will not be safe for discharge home, will likely need skilled nursing facility -Social work consult placed for skilled nursing facility  Active Problems:    Essential hypertension -BP stable, not on any antihypertensives    H/O colon cancer -Status post chemo/resection, follow-up patient    CVA, old, left hemiparesis ([place]) -Continue aspirin, statin -Per patient she used to be on Coumadin until 2 years ago when it was stopped by her PCP at [facility] in [place].  Patient had a GI bleed with Coumadin    Type 2 diabetes mellitus  with long-term current use of insulin ([place]), uncontrolled with hyperglycemia -CBGs elevated in 200s, hemoglobin A1c 8.9 -Change diet to carb modified, increase Levemir to 14 units qhs, add meal coverage, sliding scale insulin change to moderate  History of prior GI bleed -Allergic to Protonix, has not been taking any H2 blocker either, takes apple cider for GERD if she needs.  States no bleeding in the last 2 years  Code Status: Full CODE STATUS DVT Prophylaxis: Heparin subcu Family Communication: Discussed in detail with the patient, all imaging results, lab results explained to the patient   Disposition Plan: Await PT evaluation, lives at home PTA with her son PT evaluation done, with functional limitations postop surgery for right knee, needs skilled nursing facility for rehab.  Unsafe discharge to  home, will be home alone as per son works during the day.  Remains inpatient until safe disposition plan.  Time Spent in minutes   35 minutes  Procedures:  Right patellar tendon repair on 8/15  Consultants:    Orthopedics, Dr. Tamera Punt    Antimicrobials:   Anti-infectives (From admission, onward)   Start     Dose/Rate Route Frequency Ordered Stop   01/16/19 0600  ceFAZolin (ANCEF) IVPB 2g/100 mL premix     2 g 200 mL/hr over 30 Minutes Intravenous On call to O.R. 01/15/19 1241 01/15/19 1318   01/15/19 1242  ceFAZolin (ANCEF) 2-4 GM/100ML-% IVPB    Note to Pharmacy: Guerry Bruin   : cabinet override      01/15/19 1242 01/15/19 1318         Medications  Scheduled Meds:   acetaminophen  1,000 mg Oral Q8H   aspirin EC  81 mg Oral Daily   docusate sodium  100 mg Oral BID   insulin aspart  0-15 Units Subcutaneous TID WC   insulin aspart  0-5 Units Subcutaneous QHS   insulin detemir  14 Units Subcutaneous QHS   polyethylene glycol  17 g Oral Daily   pravastatin  40 mg Oral q1800   Continuous Infusions:  methocarbamol (ROBAXIN) IV Stopped (01/15/19 1511)   PRN Meds:.[START ON 01/18/2019] acetaminophen, HYDROmorphone (DILAUDID) injection, methocarbamol **OR** methocarbamol (ROBAXIN) IV, metoCLOPramide **OR** metoCLOPramide (REGLAN) injection, ondansetron **OR** ondansetron (ZOFRAN) IV, oxyCODONE, oxyCODONE      Subjective:   [employer] was seen and examined today.  Pain in the right knee 6/10 at the time of my examination.  Patient denies dizziness, chest pain, shortness of breath, abdominal pain, N/V/D/C, new weakness, numbess, tingling. No acute events overnight.    Objective:   Vitals:   01/15/19 2235 01/16/19 0115 01/16/19 0455 01/16/19 0500  BP: 105/62 129/77 109/67   Pulse: 90 93 91   Resp: 14 16 14    Temp: 98.4 F (36.9 C) 97.8 F (36.6 C) 98.4 F (36.9 C)   TempSrc: Oral Oral Oral   SpO2: 92% 94% 93%   Weight:    74.4 kg    Intake/Output Summary (Last 24 hours) at 01/16/2019 0928 Last data filed at 01/16/2019 0731 Gross per 24 hour  Intake 1490 ml  Output 2450 ml  Net -960 ml     Wt Readings from Last 3 Encounters:  01/16/19 74.4 kg  10/19/18 68.5 kg  05/29/18 68 kg     Exam  General: Alert and oriented x 3, NAD  Eyes:   HEENT:  Atraumatic, normocephalic,  Cardiovascular: S1 S2 auscultated, Regular rate and rhythm.  Respiratory: Clear to auscultation bilaterally, no wheezing, rales or rhonchi  Gastrointestinal: Soft, nontender, nondistended, + bowel sounds  Ext: no pedal edema bilaterally  Neuro: At baseline has left-sided hemiparesis from prior CVA, right LE in knee  immobilizer  Musculoskeletal: No digital cyanosis, clubbing  Skin: No rashes  Psych: Normal affect and demeanor, alert and oriented x3    Data Reviewed:  I have personally reviewed following labs and imaging studies  Micro Results Recent Results (from the past 240 hour(s))  SARS Coronavirus 2 [facility] order, Performed in [facility] lab) Nasopharyngeal Nasopharyngeal Swab     Status: None   Collection Time: 01/15/19 10:43 AM   Specimen: Nasopharyngeal Swab  Result Value Ref Range Status   SARS Coronavirus 2 NEGATIVE NEGATIVE Final    Comment: (NOTE) If result is NEGATIVE SARS-CoV-2 target nucleic acids  are NOT DETECTED. The SARS-CoV-2 RNA is generally detectable in upper and lower  respiratory specimens during the acute phase of infection. The lowest  concentration of SARS-CoV-2 viral copies this assay can detect is 250  copies / mL. A negative result does not preclude SARS-CoV-2 infection  and should not be used as the sole basis for treatment or other  patient management decisions.  A negative result may occur with  improper specimen collection / handling, submission of specimen other  than nasopharyngeal swab, presence of viral mutation(s) within the  areas targeted by this assay, and inadequate number of viral copies  (<250 copies / mL). A negative result must be combined with clinical  observations, patient history, and epidemiological information. If result is POSITIVE SARS-CoV-2 target nucleic acids are DETECTED. The SARS-CoV-2 RNA is generally detectable in upper and lower  respiratory specimens dur ing the acute phase of infection.  Positive  results are indicative of active infection with SARS-CoV-2.  Clinical  correlation with patient history and other diagnostic information is  necessary to determine patient infection status.  Positive results do  not rule out bacterial infection or co-infection with other viruses. If result is PRESUMPTIVE  POSTIVE SARS-CoV-2 nucleic acids MAY BE PRESENT.   A presumptive positive result was obtained on the submitted specimen  and confirmed on repeat testing.  While 2019 novel coronavirus  (SARS-CoV-2) nucleic acids may be present in the submitted sample  additional confirmatory testing may be necessary for epidemiological  and / or clinical management purposes  to differentiate between  SARS-CoV-2 and other Sarbecovirus currently known to infect humans.  If clinically indicated additional testing with an alternate test  methodology [phone]) is advised. The SARS-CoV-2 RNA is generally  detectable in upper and lower respiratory sp ecimens during the acute  phase of infection. The expected result is Negative. Fact Sheet for Patients:  StrictlyIdeas.no Fact Sheet for Healthcare Providers: BankingDealers.co.za This test is not yet approved or cleared by the [place] FDA and has been authorized for detection and/or diagnosis of SARS-CoV-2 by FDA under an Emergency Use Authorization (EUA).  This EUA will remain in effect (meaning this test can be used) for the duration of the COVID-19 declaration under Section 564(b)(1) of the Act, 21 U.S.C. section 360bbb-3(b)(1), unless the authorization is terminated or revoked sooner. Performed at [facility], [place] [address] [place], [place] [address]     Radiology Reports Ct Head Wo Contrast  Result Date: 01/15/2019 CLINICAL DATA:  Headache.  Posttraumatic.  Slipped and hit head. EXAM: CT HEAD WITHOUT CONTRAST TECHNIQUE: Contiguous axial images were obtained from the base of the skull through the vertex without intravenous contrast. COMPARISON:  Head CT 08/17/2010 FINDINGS: Brain: No evidence for acute hemorrhage, mass lesion, midline shift, hydrocephalus or large infarct. Small amount of mineralization in the basal ganglia region. Vascular: No hyperdense vessel or unexpected  calcification. Skull: Scalp hematoma along the left posterior parietal region near the vertex. Negative for a calvarial fracture. Sinuses/Orbits: Visualized paranasal sinuses are clear. Other: None. IMPRESSION: 1. No acute intracranial abnormality. 2. Left posterior scalp hematoma.  No underlying fracture. Electronically Signed   By: Markus Daft M.D.   On: 01/15/2019 08:26   Dg Chest Port 1 View  Result Date: 01/15/2019 CLINICAL DATA:  Preop, knee surgery today. EXAM: PORTABLE CHEST 1 VIEW COMPARISON:  Chest x-ray dated 08/10/2014. FINDINGS: LEFT chest wall Port-A-Cath in place with tip positioned at the level of the upper SVC. Heart size and mediastinal  contours are within normal limits. Stable mild scarring/fibrosis within the RIGHT upper lung. No confluent opacity to suggest a developing pneumonia. No pleural effusion seen. Osseous structures about the chest are unremarkable. IMPRESSION: No active disease. Electronically Signed   By: Franki Cabot M.D.   On: 01/15/2019 12:17   Dg Knee Complete 4 Views Right  Result Date: 01/15/2019 CLINICAL DATA:  [place] in bathroom. Right knee pain and swelling. Initial encounter. EXAM: RIGHT KNEE - COMPLETE 4+ VIEW COMPARISON:  None. FINDINGS: A displaced fracture through the inferior aspect of the patella is seen. Marked prepatellar soft tissue swelling is seen but there is no evidence of joint effusion. Nondisplaced fracture of proximal fibula is also noted. IMPRESSION: 1. Displaced fracture through inferior patella. 2. Nondisplaced proximal fibular fracture. Electronically Signed   By: Marlaine Hind M.D.   On: 01/15/2019 08:27    Lab Data:  CBC: Recent Labs  Lab 01/15/19 1043 01/16/19 0259  WBC 9.8 8.6  NEUTROABS 7.3  --   HGB 13.3 11.7*  HCT 40.0 36.3  MCV 80.5 82.3  PLT 216 374   Basic Metabolic Panel: Recent Labs  Lab 01/15/19 1043 01/16/19 0259  NA 142 139  K 3.6 3.8  CL 108 107  CO2 26 22  GLUCOSE 99 251*  BUN 12 15  CREATININE 0.51 0.55   CALCIUM 8.9 8.2*   GFR: Estimated Creatinine Clearance: 70.4 mL/min (by C-G formula based on SCr of 0.55 mg/dL). Liver Function Tests: Recent Labs  Lab 01/16/19 0259  AST 18  ALT 15  ALKPHOS 84  BILITOT 1.0  PROT 6.3*  ALBUMIN 3.3*   No results for input(s): LIPASE, AMYLASE in the last 168 hours. No results for input(s): AMMONIA in the last 168 hours. Coagulation Profile: No results for input(s): INR, PROTIME in the last 168 hours. Cardiac Enzymes: No results for input(s): CKTOTAL, CKMB, CKMBINDEX, TROPONINI in the last 168 hours. BNP (last 3 results) No results for input(s): PROBNP in the last 8760 hours. HbA1C: Recent Labs    01/15/19 1043  HGBA1C 8.9*   CBG: Recent Labs  Lab 01/15/19 1438 01/15/19 1745 01/15/19 2241 01/16/19 0717  GLUCAP 139* 237* 232* 230*   Lipid Profile: Recent Labs    01/15/19 1043  CHOL 150  HDL 71  LDLCALC 64  TRIG 74  CHOLHDL 2.1   Thyroid Function Tests: No results for input(s): TSH, T4TOTAL, FREET4, T3FREE, THYROIDAB in the last 72 hours. Anemia Panel: No results for input(s): VITAMINB12, FOLATE, FERRITIN, TIBC, IRON, RETICCTPCT in the last 72 hours. Urine analysis:    Component Value Date/Time   COLORURINE YELLOW 11/28/2016 0113   APPEARANCEUR HAZY (A) 11/28/2016 0113   LABSPEC 1.015 12/17/2017 1623   PHURINE 6.0 12/17/2017 1623   GLUCOSEU 500 (A) 12/17/2017 1623   HGBUR NEGATIVE 12/17/2017 1623   HGBUR negative 04/02/2010 1013   BILIRUBINUR NEGATIVE 12/17/2017 1623   KETONESUR 15 (A) 12/17/2017 1623   PROTEINUR NEGATIVE 12/17/2017 1623   UROBILINOGEN 0.2 12/17/2017 1623   NITRITE POSITIVE (A) 12/17/2017 1623   LEUKOCYTESUR NEGATIVE 12/17/2017 1623     Stella Encarnacion M.D. [facility] 01/16/2019, 9:28 AM  Pager: [phone] Between 7am to 7pm - call Pager - [phone]  After 7pm go to [email or web address] - password [email or web address]  Call night coverage person covering after 7pm

## 2019-01-16 NOTE — Discharge Instructions (Signed)
Discharge Instructions after Knee Surgery   You will have a light dressing on your knee.  Leave the dressing in place until your first follow up visit with Dr. Tamera Punt Do not get dressing wet. Pump your foot up and down 20 times per hour, every hour you are awake.  Apply ice to the knee 3 times per day for 30 minutes for the first 1 week until your knee is feeling comfortable again. Do not use heat.  Okay to bear weight on leg in hinge knee brace locked at full extension Pain medicine has been prescribed for you.  Use your medicine as needed over the first 48 hours, and then you can begin to taper your use. You may take Extra Strength Tylenol or Tylenol only in place of the pain pills.  Take one 81mg  aspirin daily for 3 weeks post-operatively unless it upsets your stomach.   Please call [phone] during normal business hours or [phone] after hours for any problems. Including the following:  - excessive redness of the incisions - drainage for more than 4 days - fever of more than 101.5 F  *Please note that pain medications will not be refilled after hours or on weekends.

## 2019-01-16 NOTE — Plan of Care (Signed)

## 2019-01-17 ENCOUNTER — Encounter (HOSPITAL_COMMUNITY): Payer: Self-pay | Admitting: Orthopedic Surgery

## 2019-01-17 LAB — BASIC METABOLIC PANEL
Anion gap: 10 (ref 5–15)
BUN: 22 mg/dL (ref 8–23)
CO2: 24 mmol/L (ref 22–32)
Calcium: 8.2 mg/dL — ABNORMAL LOW (ref 8.9–10.3)
Chloride: 108 mmol/L (ref 98–111)
Creatinine, Ser: 0.68 mg/dL (ref 0.44–1.00)
GFR calc Af Amer: >60 mL/min (ref >60)
GFR calc non Af Amer: >60 mL/min (ref >60)
Glucose, Bld: 232 mg/dL — ABNORMAL HIGH (ref 70–99)
Potassium: 3.5 mmol/L (ref 3.5–5.1)
Sodium: 142 mmol/L (ref 135–145)

## 2019-01-17 LAB — CBC
HCT: 36 % (ref 36.0–46.0)
Hemoglobin: 11.5 g/dL — ABNORMAL LOW (ref 12.0–15.0)
MCH: 26.5 pg (ref 26.0–34.0)
MCHC: 31.9 g/dL (ref 30.0–36.0)
MCV: 82.9 fL (ref 80.0–100.0)
Platelets: 181 10*3/uL (ref 150–400)
RBC: 4.34 MIL/uL (ref 3.87–5.11)
RDW: 14.3 % (ref 11.5–15.5)
WBC: 8 10*3/uL (ref 4.0–10.5)
nRBC: 0 % (ref 0.0–0.2)

## 2019-01-17 LAB — GLUCOSE, CAPILLARY
Glucose-Capillary: 212 mg/dL — ABNORMAL HIGH (ref 70–99)
Glucose-Capillary: 187 mg/dL — ABNORMAL HIGH (ref 70–99)
Glucose-Capillary: 224 mg/dL — ABNORMAL HIGH (ref 70–99)
Glucose-Capillary: 199 mg/dL — ABNORMAL HIGH (ref 70–99)

## 2019-01-17 MED ORDER — ONDANSETRON HCL 4 MG PO TABS: 4.0000 mg | ORAL_TABLET | Freq: Four times a day (QID) | ORAL | 0 refills | Status: DC | PRN

## 2019-01-17 MED ORDER — POLYETHYLENE GLYCOL 3350 17 G PO PACK: 17.0000 g | PACK | Freq: Every day | ORAL | 0 refills | Status: DC | PRN

## 2019-01-17 MED ORDER — METHOCARBAMOL 500 MG PO TABS: 500.0000 mg | ORAL_TABLET | Freq: Four times a day (QID) | ORAL | Status: DC | PRN

## 2019-01-17 MED ORDER — POLYETHYLENE GLYCOL 3350 17 G PO PACK: 17.0000 g | PACK | Freq: Every day | ORAL | 0 refills | Status: DC

## 2019-01-17 MED ORDER — MAGNESIUM CITRATE PO SOLN
1.0000 | Freq: Once | ORAL | Status: AC
  Administered 2019-01-17: 1 via ORAL
  Filled 2019-01-17: qty 296

## 2019-01-17 MED ORDER — OXYCODONE HCL 5 MG PO TABS: 5.0000 mg | ORAL_TABLET | Freq: Four times a day (QID) | ORAL | 0 refills | Status: DC | PRN

## 2019-01-17 MED ORDER — PRAVASTATIN SODIUM 40 MG PO TABS: 40.0000 mg | ORAL_TABLET | Freq: Every day | ORAL | Status: DC

## 2019-01-17 MED ORDER — DOCUSATE SODIUM 100 MG PO CAPS: 100.0000 mg | ORAL_CAPSULE | Freq: Two times a day (BID) | ORAL | 0 refills | Status: DC

## 2019-01-17 NOTE — NC FL2 (Signed)
[place] LEVEL OF CARE SCREENING TOOL     IDENTIFICATION  Patient Name: [name] [facility] Birthdate: 1956/03/20 Sex: female Admission Date (Current Location): 01/15/2019  [facility] and [place] Number:  Herbalist and Address:         Provider Number: [id number]  Attending Physician Name and Address:  Mendel Corning, MD  Relative Name and Phone Number:       Current Level of Care: SNF Recommended Level of Care: [place] Prior Approval Number:    Date Approved/Denied:   PASRR Number:    Discharge Plan: SNF    Current Diagnoses: Patient Active Problem List   Diagnosis Date Noted  . CVA, old, hemiparesis ([place]) 01/16/2019  . Type 2 diabetes mellitus without complication, with long-term current use of insulin ([place]) 01/16/2019  . Right patella fracture 01/16/2019  . Patellar fracture 01/15/2019  . Rectal bleeding 04/10/2012  . Supratherapeutic INR 04/10/2012  . Heme positive stool 04/10/2012  . Melena 11/07/2011  . Coagulopathy ([place]) 11/07/2011  . H/O colon cancer 11/07/2011  . OTHER ANAPHYLACTIC REACTION 06/20/2010  . ULCERATION OF INTESTINE 04/05/2010  . ANEMIA 04/02/2010  . SCABIES 07/06/2009  . UPPER RESPIRATORY INFECTION 07/06/2009  . PRURITUS, VAGINAL 01/25/2009  . URINARY INCONTINENCE 01/25/2009  . CANDIDIASIS, VAGINAL 11/06/2008  . EXTERNAL OTITIS 07/26/2008  . CHEST PAIN 05/05/2008  . EPISTAXIS 10/07/2007  . COLONIC POLYPS, HX OF 02/09/2007  . ANEMIA, B12 DEFICIENCY 01/19/2007  . SINUSITIS, ACUTE NOS 01/19/2007  . HYPERLIPIDEMIA 11/10/2006  . Essential hypertension 11/10/2006  . Unspecified cerebral artery occlusion with cerebral infarction 06/02/2001  . PATENT FORAMEN OVALE 04/21/2001  . DIABETES MELLITUS, TYPE II, UNCONTROLLED 09/01/1999    Orientation RESPIRATION BLADDER Height & Weight     Self, Time, Situation, Place  Normal Continent Weight: 74.4 kg Height:  5\' 3"  (160 cm)  BEHAVIORAL  SYMPTOMS/MOOD NEUROLOGICAL BOWEL NUTRITION STATUS        Diet(regular)  AMBULATORY STATUS COMMUNICATION OF NEEDS Skin   Extensive Assist Verbally Normal                       Personal Care Assistance Level of Assistance  Bathing, Feeding, Dressing Bathing Assistance: Limited assistance Feeding assistance: Limited assistance Dressing Assistance: Limited assistance     Functional Limitations Info             SPECIAL CARE FACTORS FREQUENCY                       Contractures Contractures Info: Not present    Additional Factors Info  Code Status Code Status Info: full             Current Medications (01/17/2019):  This is the current hospital active medication list Current Facility-Administered Medications  Medication Dose Route Frequency Provider Last Rate Last Dose  . acetaminophen (TYLENOL) tablet 1,000 mg  1,000 mg Oral Q8H Elodia Florence., MD   1,000 mg at 01/17/19 [phone]  . [START ON 01/18/2019] acetaminophen (TYLENOL) tablet 325-650 mg  325-650 mg Oral Q6H PRN Grier Mitts, PA-C      . aspirin EC tablet 81 mg  81 mg Oral Daily Elodia Florence., MD   81 mg at 01/17/19 0748  . docusate sodium (COLACE) capsule 100 mg  100 mg Oral BID Grier Mitts, PA-C   100 mg at 01/17/19 0748  . heparin injection 5,000 Units  5,000 Units Subcutaneous Q8H Rai,  Ripudeep K, MD   5,000 Units at 01/17/19 0647  . HYDROmorphone (DILAUDID) injection 0.5-1 mg  0.5-1 mg Intravenous Q4H PRN Grier Mitts, PA-C   1 mg at 01/15/19 1757  . insulin aspart (novoLOG) injection 0-15 Units  0-15 Units Subcutaneous TID WC Rai, Ripudeep K, MD   5 Units at 01/17/19 0744  . insulin aspart (novoLOG) injection 0-5 Units  0-5 Units Subcutaneous QHS Elodia Florence., MD   2 Units at 01/16/19 2223  . insulin aspart (novoLOG) injection 3 Units  3 Units Subcutaneous TID WC Rai, Ripudeep K, MD   3 Units at 01/17/19 0744  . insulin detemir (LEVEMIR) injection 14 Units  14  Units Subcutaneous QHS Rai, Ripudeep K, MD   14 Units at 01/16/19 2224  . magnesium citrate solution 1 Bottle  1 Bottle Oral Once Rai, Ripudeep K, MD      . methocarbamol (ROBAXIN) tablet 500 mg  500 mg Oral Q6H PRN Grier Mitts, PA-C   500 mg at 01/16/19 1823   Or  . methocarbamol (ROBAXIN) 500 mg in dextrose 5 % 50 mL IVPB  500 mg Intravenous Q6H PRN Grier Mitts, PA-C   Stopped at 01/15/19 1511  . metoCLOPramide (REGLAN) tablet 5-10 mg  5-10 mg Oral Q8H PRN Grier Mitts, PA-C       Or  . metoCLOPramide (REGLAN) injection 5-10 mg  5-10 mg Intravenous Q8H PRN Grier Mitts, PA-C   10 mg at 01/17/19 1028  . ondansetron (ZOFRAN) tablet 4 mg  4 mg Oral Q6H PRN Grier Mitts, PA-C   4 mg at 01/17/19 0742   Or  . ondansetron (ZOFRAN) injection 4 mg  4 mg Intravenous Q6H PRN Grier Mitts, PA-C   4 mg at 01/15/19 2242  . oxyCODONE (Oxy IR/ROXICODONE) immediate release tablet 10-15 mg  10-15 mg Oral Q4H PRN Grier Mitts, PA-C      . oxyCODONE (Oxy IR/ROXICODONE) immediate release tablet 5-10 mg  5-10 mg Oral Q4H PRN Grier Mitts, PA-C   5 mg at 01/17/19 0742  . polyethylene glycol (MIRALAX / GLYCOLAX) packet 17 g  17 g Oral Daily Elodia Florence., MD   17 g at 01/17/19 [phone]  . pravastatin (PRAVACHOL) tablet 40 mg  40 mg Oral q1800 Elodia Florence., MD   40 mg at 01/16/19 1759     Discharge Medications: Please see discharge summary for a list of discharge medications.  Relevant Imaging Results:  Relevant Lab Results:   Additional Information OAC:[id number]  Leeroy Cha, RN

## 2019-01-17 NOTE — TOC Initial Note (Addendum)
Transition of Care [facility]) - Initial/Assessment Note    Patient Details  Name: [name] MRN: [id number] Date of Birth: Oct 04, 1955  Transition of Care [facility]) CM/SW Contact:    Leeroy Cha, RN Phone Number: 01/17/2019, 10:51 AM  Clinical Narrative:                 fl2 faxed out to area [place], [place] and [employer].  Insurance is Medicare part A. [facility] bed, information completed and ptar called. [facility] does not have a bed unable to take, weill see if [facility] can accept, [facility] have a bed will email the papers for admission to be completed with patient. Paper for admission read and completed with the patient. Papers for admission to [facility] emailed to [place].       Patient Goals and CMS Choice        Expected Discharge Plan and Services                                                Prior Living Arrangements/Services                       Activities of Daily Living Home Assistive Devices/Equipment: Cane (specify quad or straight) ADL Screening (condition at time of admission) Patient's cognitive ability adequate to safely complete daily activities?: Yes Is the patient deaf or have difficulty hearing?: No Does the patient have difficulty seeing, even when wearing glasses/contacts?: No Does the patient have difficulty concentrating, remembering, or making decisions?: No Patient able to express need for assistance with ADLs?: Yes Does the patient have difficulty dressing or bathing?: No Independently performs ADLs?: Yes (appropriate for developmental age) Does the patient have difficulty walking or climbing stairs?: No Weakness of Legs: None Weakness of Arms/Hands: None  Permission Sought/Granted                  Emotional Assessment              Admission diagnosis:  Preop testing [Z01.818] Closed fracture of proximal end of right fibula, unspecified  fracture morphology, initial encounter [S82.831A] Closed displaced fracture of right patella, unspecified fracture morphology, initial encounter [S82.001A] Covid-19 Virus not Detected [IMO0001] Patient Active Problem List   Diagnosis Date Noted  . CVA, old, hemiparesis ([place]) 01/16/2019  . Type 2 diabetes mellitus without complication, with long-term current use of insulin ([place]) 01/16/2019  . Right patella fracture 01/16/2019  . Patellar fracture 01/15/2019  . Rectal bleeding 04/10/2012  . Supratherapeutic INR 04/10/2012  . Heme positive stool 04/10/2012  . Melena 11/07/2011  . Coagulopathy ([place]) 11/07/2011  . H/O colon cancer 11/07/2011  . OTHER ANAPHYLACTIC REACTION 06/20/2010  . ULCERATION OF INTESTINE 04/05/2010  . ANEMIA 04/02/2010  . SCABIES 07/06/2009  . UPPER RESPIRATORY INFECTION 07/06/2009  . PRURITUS, VAGINAL 01/25/2009  . URINARY INCONTINENCE 01/25/2009  . CANDIDIASIS, VAGINAL 11/06/2008  . EXTERNAL OTITIS 07/26/2008  . CHEST PAIN 05/05/2008  . EPISTAXIS 10/07/2007  . COLONIC POLYPS, HX OF 02/09/2007  . ANEMIA, B12 DEFICIENCY 01/19/2007  . SINUSITIS, ACUTE NOS 01/19/2007  . HYPERLIPIDEMIA 11/10/2006  . Essential hypertension 11/10/2006  . Unspecified cerebral artery occlusion with cerebral infarction 06/02/2001  . PATENT FORAMEN OVALE 04/21/2001  . DIABETES MELLITUS, TYPE II, UNCONTROLLED 09/01/1999   PCP:  Patient, No Pcp Per  Pharmacy:   [place], [place] [address] [place] [address] Phone: [phone] Fax: [phone]  [facility] [place], [address] [place] [address] Phone: [phone] Fax: [phone]     Social Determinants of Health (SDOH) Interventions    Readmission Risk Interventions No flowsheet data found.

## 2019-01-17 NOTE — Discharge Summary (Signed)
Physician Discharge Summary   Patient ID: [name] MRN: [id number] DOB/AGE: May 31, 1956 63 y.o.  Admit date: 01/15/2019 Discharge date: 01/17/2019  Primary Care Physician:  Patient, No Pcp Per   Recommendations for Outpatient Follow-up:  1. Patient recommended to follow-up with Dr. Tamera Punt in 10 days  2.  Per orthopedics instructions S/p R patellar tendon repair WBAT R LE in hinged knee brace locked in full extension.  Up with PT today Fu w Dr. Tamera Punt in 10-14 days    Home Health: PT recommended skilled nursing facility Equipment/Devices:   Discharge Condition: Stable CODE STATUS: FULL  Diet recommendation: Carb modified diet   Discharge Diagnoses:      . Right patellar fracture, proximal fibula fracture status post right patella tendon repair  mechanical fall . H/O colon cancer . Essential hypertension . History of CVA, left hemiparesis  History of prior GI bleed Type 2 diabetes mellitus, IDDM, uncontrolled with hyperglycemia   Consults: Orthopedics   Allergies:   Allergies  Allergen Reactions  . Glipizide Nausea And Vomiting    REACTION: nausea and vomiting  . Ibuprofen Other (See Comments)    REACTION: tongue swells and dyspnea  . Iohexol      Code: HIVES, Desc: pt called day after IV contrast c/o red, swollen itching feet and red face that started 4hrs after ct, Onset Date: [id number]   . Rosiglitazone-Metformin     REACTION: rash--tolerates Metformin alone  . Hydrocodone-Acetaminophen Nausea And Vomiting and Rash    REACTION: vomiting and rash  . Morphine Rash    REACTION: rash  . Pantoprazole Palpitations  . Sulfamethoxazole Rash    REACTION: rash     DISCHARGE MEDICATIONS: Allergies as of 01/17/2019      Reactions   Glipizide Nausea And Vomiting   REACTION: nausea and vomiting   Ibuprofen Other (See Comments)   REACTION: tongue swells and dyspnea   Iohexol     Code: HIVES, Desc: pt called day after IV contrast c/o red,  swollen itching feet and red face that started 4hrs after ct, Onset Date: [id number]   Rosiglitazone-metformin    REACTION: rash--tolerates Metformin alone   Hydrocodone-acetaminophen Nausea And Vomiting, Rash   REACTION: vomiting and rash   Morphine Rash   REACTION: rash   Pantoprazole Palpitations   Sulfamethoxazole Rash   REACTION: rash      Medication List    TAKE these medications   acetaminophen 500 MG tablet Commonly known as: TYLENOL Take 1 tablet (500 mg total) by mouth every 6 (six) hours as needed.   aspirin EC 81 MG tablet Take 81 mg by mouth daily.   docusate sodium 100 MG capsule Commonly known as: COLACE Take 1 capsule (100 mg total) by mouth 2 (two) times daily.   EPINEPHrine 0.3 mg/0.3 mL Soaj injection Commonly known as: EPI-PEN Inject 0.3 mLs (0.3 mg total) into the muscle as needed (Tongue swelling, anaphylaxis).   insulin aspart 100 UNIT/ML injection Commonly known as: novoLOG Inject 30 Units into the skin 2 (two) times daily.   insulin detemir 100 UNIT/ML injection Commonly known as: LEVEMIR Inject 15 Units into the skin every evening.   methocarbamol 500 MG tablet Commonly known as: ROBAXIN Take 1 tablet (500 mg total) by mouth every 6 (six) hours as needed for muscle spasms.   ondansetron 4 MG tablet Commonly known as: ZOFRAN Take 1 tablet (4 mg total) by mouth every 6 (six) hours as needed for nausea.   oxyCODONE 5 MG immediate release  tablet Commonly known as: Oxy IR/ROXICODONE Take 1 tablet (5 mg total) by mouth every 6 (six) hours as needed for moderate pain or severe pain.   polyethylene glycol 17 g packet Commonly known as: MIRALAX / GLYCOLAX Take 17 g by mouth daily as needed for moderate constipation.   pravastatin 40 MG tablet Commonly known as: PRAVACHOL Take 1 tablet (40 mg total) by mouth at bedtime.        Brief H and P: For complete details please refer to admission H and P, but in brief  Patient is a 63 year old  female with history of type 2 diabetes mellitus, history of prior CVA with left-sided weakness, colon cancer status post surgery, chemo history of TB status post treatment presented after a mechanical fall.  Patient reported she was in the bathroom and floor was wet, slipped hitting her right knee on the floor as well as her head.  Denied any loss of consciousness.  Does not take any blood thinners. X-ray of the right knee showed displaced fracture through the inferior patella, nondisplaced proximal tubular fracture Orthopedics was consulted, patient was admitted for further work-up COVID-19 test negative   Hospital Course:  Right patellar fracture, proximal fibula fracture, mechanical fall -CT head with left posterior scalp hematoma, did not lose consciousness -Orthopedics was consulted, patient underwent right patellar tendon repair on 8/15, postop day # 2 -Patient underwent PT evaluation and recommended functional limitations, needs skilled nursing facility for rehab.     Essential hypertension -BP stable, not on any antihypertensives    H/O colon cancer -Status post chemo/resection, follow-up patient    CVA, old, left hemiparesis ([place]) -Continue aspirin, statin -Per patient she used to be on Coumadin until 2 years ago when it was stopped by her PCP at [facility] in [place].  Patient had a GI bleed with Coumadin    Type 2 diabetes mellitus  with long-term current use of insulin ([place]), uncontrolled with hyperglycemia -Hemoglobin A1c 8.9, continue carb modified diet, insulin regimen outpatient, adjust for better glycemic control.    History of prior GI bleed -Allergic to Protonix, has not been taking any H2 blocker either, takes apple cider for GERD if she needs.  States no bleeding in the last 2 years     Day of Discharge S: No acute complaints except some nausea.  No fevers or chills, pain controlled.  BP 112/62 (BP Location: Right Arm)   Pulse 79    Temp 98.1 F (36.7 C) (Oral)   Resp 14   Ht 5\' 3"  (1.6 m)   Wt 74.4 kg   SpO2 94%   BMI 29.07 kg/m   Physical Exam: General: Alert and awake oriented x3 not in any acute distress. HEENT: anicteric sclera, pupils reactive to light and accommodation CVS: S1-S2 clear no murmur rubs or gallops Chest: clear to auscultation bilaterally, no wheezing rales or rhonchi Abdomen: soft nontender, nondistended, normal bowel sounds Extremities: no cyanosis, clubbing or edema noted bilaterally Neuro: Cranial nerves II-XII intact, no focal neurological deficits   The results of significant diagnostics from this hospitalization (including imaging, microbiology, ancillary and laboratory) are listed below for reference.      Procedures/Studies:  Ct Head Wo Contrast  Result Date: 01/15/2019 CLINICAL DATA:  Headache.  Posttraumatic.  Slipped and hit head. EXAM: CT HEAD WITHOUT CONTRAST TECHNIQUE: Contiguous axial images were obtained from the base of the skull through the vertex without intravenous contrast. COMPARISON:  Head CT 08/17/2010 FINDINGS: Brain: No evidence for  acute hemorrhage, mass lesion, midline shift, hydrocephalus or large infarct. Small amount of mineralization in the basal ganglia region. Vascular: No hyperdense vessel or unexpected calcification. Skull: Scalp hematoma along the left posterior parietal region near the vertex. Negative for a calvarial fracture. Sinuses/Orbits: Visualized paranasal sinuses are clear. Other: None. IMPRESSION: 1. No acute intracranial abnormality. 2. Left posterior scalp hematoma.  No underlying fracture. Electronically Signed   By: Markus Daft M.D.   On: 01/15/2019 08:26   Dg Chest Port 1 View  Result Date: 01/15/2019 CLINICAL DATA:  Preop, knee surgery today. EXAM: PORTABLE CHEST 1 VIEW COMPARISON:  Chest x-ray dated 08/10/2014. FINDINGS: LEFT chest wall Port-A-Cath in place with tip positioned at the level of the upper SVC. Heart size and mediastinal  contours are within normal limits. Stable mild scarring/fibrosis within the RIGHT upper lung. No confluent opacity to suggest a developing pneumonia. No pleural effusion seen. Osseous structures about the chest are unremarkable. IMPRESSION: No active disease. Electronically Signed   By: Franki Cabot M.D.   On: 01/15/2019 12:17   Dg Knee Complete 4 Views Right  Result Date: 01/15/2019 CLINICAL DATA:  [place] in bathroom. Right knee pain and swelling. Initial encounter. EXAM: RIGHT KNEE - COMPLETE 4+ VIEW COMPARISON:  None. FINDINGS: A displaced fracture through the inferior aspect of the patella is seen. Marked prepatellar soft tissue swelling is seen but there is no evidence of joint effusion. Nondisplaced fracture of proximal fibula is also noted. IMPRESSION: 1. Displaced fracture through inferior patella. 2. Nondisplaced proximal fibular fracture. Electronically Signed   By: Marlaine Hind M.D.   On: 01/15/2019 08:27       LAB RESULTS: Basic Metabolic Panel: Recent Labs  Lab 01/16/19 0259 01/17/19 0227  NA 139 142  K 3.8 3.5  CL 107 108  CO2 22 24  GLUCOSE 251* 232*  BUN 15 22  CREATININE 0.55 0.68  CALCIUM 8.2* 8.2*   Liver Function Tests: Recent Labs  Lab 01/16/19 0259  AST 18  ALT 15  ALKPHOS 84  BILITOT 1.0  PROT 6.3*  ALBUMIN 3.3*   No results for input(s): LIPASE, AMYLASE in the last 168 hours. No results for input(s): AMMONIA in the last 168 hours. CBC: Recent Labs  Lab 01/15/19 1043 01/16/19 0259 01/17/19 0227  WBC 9.8 8.6 8.0  NEUTROABS 7.3  --   --   HGB 13.3 11.7* 11.5*  HCT 40.0 36.3 36.0  MCV 80.5 82.3 82.9  PLT 216 193 181   Cardiac Enzymes: No results for input(s): CKTOTAL, CKMB, CKMBINDEX, TROPONINI in the last 168 hours. BNP: Invalid input(s): POCBNP CBG: Recent Labs  Lab 01/17/19 0727 01/17/19 1147  GLUCAP 212* 187*      Disposition and Follow-up:    DISPOSITION: Skilled nursing facility   [place]    Tania Ade, MD. Schedule an appointment as soon as possible for a visit in 10 day(s).   Specialty: Orthopedic Surgery Contact information: [address] [place] [address] [phone]            Time coordinating discharge:  61mins   Signed:   Estill Cotta M.D. [facility] 01/17/2019, 1:02 PM

## 2019-01-17 NOTE — Progress Notes (Signed)
Report was provided to [facility]. Waiting on [facility] to arrive for transport.

## 2019-01-17 NOTE — Progress Notes (Signed)
   PATIENT ID: [name]   2 Days Post-Op Procedure(s) (LRB): PATELLA TENDON REPAIR (N/A)  Subjective: Doing well this am. Up with PT yesterday but not quite mobile or safe to d/c home, rec SNF and patient is understanding.   Objective:  Vitals:   01/16/19 2048 01/17/19 0422  BP: 126/63 112/62  Pulse: 88 79  Resp: 16 14  Temp: 98.2 F (36.8 C) 98.1 F (36.7 C)  SpO2: 96% 94%      R LE dressing c/d/i Knee immobilizer in place Wiggles toes, distally NVI Calves soft, nontender  Labs:  Recent Labs    01/15/19 1043 01/16/19 0259 01/17/19 0227  HGB 13.3 11.7* 11.5*   Recent Labs    01/16/19 0259 01/17/19 0227  WBC 8.6 8.0  RBC 4.41 4.34  HCT 36.3 36.0  PLT 193 181   Recent Labs    01/16/19 0259 01/17/19 0227  NA 139 142  K 3.8 3.5  CL 107 108  CO2 22 24  BUN 15 22  CREATININE 0.55 0.68  GLUCOSE 251* 232*  CALCIUM 8.2* 8.2*    Assessment and Plan: 2 days s/p R patellar tendon repair WBAT R LE in hinged knee brace locked in full extension.  Up with PT today Fu w Dr. Tamera Punt in 10-14 days D.c per primary team Appreciate hospitalist service in providing primary care for this patient  VTE proph: asa, scd

## 2019-01-17 NOTE — NC FL2 (Addendum)
[place] LEVEL OF CARE SCREENING TOOL     IDENTIFICATION  Patient Name: [name] [facility] Birthdate: 1956-02-15 Sex: female Admission Date (Current Location): 01/15/2019  [facility] and [place] Number:  Herbalist and Address:         Provider Number: [id number]  Attending Physician Name and Address:  Mendel Corning, MD  Relative Name and Phone Number:       Current Level of Care: SNF Recommended Level of Care: [place] Prior Approval Number:    Date Approved/Denied:   PASRR Number:  [id number]  Discharge Plan: SNF    Current Diagnoses: Patient Active Problem List   Diagnosis Date Noted  . CVA, old, hemiparesis ([place]) 01/16/2019  . Type 2 diabetes mellitus without complication, with long-term current use of insulin ([place]) 01/16/2019  . Right patella fracture 01/16/2019  . Patellar fracture 01/15/2019  . Rectal bleeding 04/10/2012  . Supratherapeutic INR 04/10/2012  . Heme positive stool 04/10/2012  . Melena 11/07/2011  . Coagulopathy ([place]) 11/07/2011  . H/O colon cancer 11/07/2011  . OTHER ANAPHYLACTIC REACTION 06/20/2010  . ULCERATION OF INTESTINE 04/05/2010  . ANEMIA 04/02/2010  . SCABIES 07/06/2009  . UPPER RESPIRATORY INFECTION 07/06/2009  . PRURITUS, VAGINAL 01/25/2009  . URINARY INCONTINENCE 01/25/2009  . CANDIDIASIS, VAGINAL 11/06/2008  . EXTERNAL OTITIS 07/26/2008  . CHEST PAIN 05/05/2008  . EPISTAXIS 10/07/2007  . COLONIC POLYPS, HX OF 02/09/2007  . ANEMIA, B12 DEFICIENCY 01/19/2007  . SINUSITIS, ACUTE NOS 01/19/2007  . HYPERLIPIDEMIA 11/10/2006  . Essential hypertension 11/10/2006  . Unspecified cerebral artery occlusion with cerebral infarction 06/02/2001  . PATENT FORAMEN OVALE 04/21/2001  . DIABETES MELLITUS, TYPE II, UNCONTROLLED 09/01/1999    Orientation RESPIRATION BLADDER Height & Weight     Self, Time, Situation, Place  Normal Continent Weight: 74.4 kg Height:  5\' 3"  (160 cm)   BEHAVIORAL SYMPTOMS/MOOD NEUROLOGICAL BOWEL NUTRITION STATUS        Diet(regular)  AMBULATORY STATUS COMMUNICATION OF NEEDS Skin   Extensive Assist Verbally Normal                       Personal Care Assistance Level of Assistance  Bathing, Feeding, Dressing Bathing Assistance: Limited assistance Feeding assistance: Limited assistance Dressing Assistance: Limited assistance     Functional Limitations Info             SPECIAL CARE FACTORS FREQUENCY                       Contractures Contractures Info: Not present    Additional Factors Info  Code Status Code Status Info: full             Current Medications (01/17/2019):  This is the current hospital active medication list Current Facility-Administered Medications  Medication Dose Route Frequency Provider Last Rate Last Dose  . acetaminophen (TYLENOL) tablet 1,000 mg  1,000 mg Oral Q8H Elodia Florence., MD   1,000 mg at 01/17/19 [phone]  . [START ON 01/18/2019] acetaminophen (TYLENOL) tablet 325-650 mg  325-650 mg Oral Q6H PRN Grier Mitts, PA-C      . aspirin EC tablet 81 mg  81 mg Oral Daily Elodia Florence., MD   81 mg at 01/17/19 0748  . docusate sodium (COLACE) capsule 100 mg  100 mg Oral BID Grier Mitts, PA-C   100 mg at 01/17/19 0748  . heparin injection 5,000 Units  5,000 Units Subcutaneous Q8H Rai,  Ripudeep K, MD   5,000 Units at 01/17/19 0647  . HYDROmorphone (DILAUDID) injection 0.5-1 mg  0.5-1 mg Intravenous Q4H PRN Grier Mitts, PA-C   1 mg at 01/15/19 1757  . insulin aspart (novoLOG) injection 0-15 Units  0-15 Units Subcutaneous TID WC Rai, Ripudeep K, MD   5 Units at 01/17/19 0744  . insulin aspart (novoLOG) injection 0-5 Units  0-5 Units Subcutaneous QHS Elodia Florence., MD   2 Units at 01/16/19 2223  . insulin aspart (novoLOG) injection 3 Units  3 Units Subcutaneous TID WC Rai, Ripudeep K, MD   3 Units at 01/17/19 0744  . insulin detemir (LEVEMIR) injection  14 Units  14 Units Subcutaneous QHS Rai, Ripudeep K, MD   14 Units at 01/16/19 2224  . magnesium citrate solution 1 Bottle  1 Bottle Oral Once Rai, Ripudeep K, MD      . methocarbamol (ROBAXIN) tablet 500 mg  500 mg Oral Q6H PRN Grier Mitts, PA-C   500 mg at 01/16/19 1823   Or  . methocarbamol (ROBAXIN) 500 mg in dextrose 5 % 50 mL IVPB  500 mg Intravenous Q6H PRN Grier Mitts, PA-C   Stopped at 01/15/19 1511  . metoCLOPramide (REGLAN) tablet 5-10 mg  5-10 mg Oral Q8H PRN Grier Mitts, PA-C       Or  . metoCLOPramide (REGLAN) injection 5-10 mg  5-10 mg Intravenous Q8H PRN Grier Mitts, PA-C   10 mg at 01/17/19 1028  . ondansetron (ZOFRAN) tablet 4 mg  4 mg Oral Q6H PRN Grier Mitts, PA-C   4 mg at 01/17/19 0742   Or  . ondansetron (ZOFRAN) injection 4 mg  4 mg Intravenous Q6H PRN Grier Mitts, PA-C   4 mg at 01/15/19 2242  . oxyCODONE (Oxy IR/ROXICODONE) immediate release tablet 10-15 mg  10-15 mg Oral Q4H PRN Grier Mitts, PA-C      . oxyCODONE (Oxy IR/ROXICODONE) immediate release tablet 5-10 mg  5-10 mg Oral Q4H PRN Grier Mitts, PA-C   5 mg at 01/17/19 0742  . polyethylene glycol (MIRALAX / GLYCOLAX) packet 17 g  17 g Oral Daily Elodia Florence., MD   17 g at 01/17/19 [phone]  . pravastatin (PRAVACHOL) tablet 40 mg  40 mg Oral q1800 Elodia Florence., MD   40 mg at 01/16/19 1759     Discharge Medications: Please see discharge summary for a list of discharge medications.  Relevant Imaging Results:  Relevant Lab Results:   Additional Information [id number]  Leeroy Cha, RN

## 2019-01-18 NOTE — Progress Notes (Signed)
No answer on phone when [facility] called at [facility], [place] arrived 1920 for transport, left and would return to transport.  CN had left message for receiving facility to return call for report.  [facility] transported Patient at this time.  HS medications given.  VS stable.  Patient declined oxycodone prior to transport.

## 2019-05-12 ENCOUNTER — Other Ambulatory Visit: Payer: Self-pay

## 2019-05-12 ENCOUNTER — Emergency Department (HOSPITAL_BASED_OUTPATIENT_CLINIC_OR_DEPARTMENT_OTHER)
Admission: EM | Admit: 2019-05-12 | Discharge: 2019-05-12 | Disposition: A | Discharge status: Home / Self Care | Payer: Medicare Other | Attending: Emergency Medicine | Admitting: Emergency Medicine

## 2019-05-12 ENCOUNTER — Encounter (HOSPITAL_BASED_OUTPATIENT_CLINIC_OR_DEPARTMENT_OTHER): Payer: Self-pay | Admitting: *Deleted

## 2019-05-12 DIAGNOSIS — U071 COVID-19: Secondary | ICD-10-CM | POA: Insufficient documentation

## 2019-05-12 DIAGNOSIS — Z79899 Other long term (current) drug therapy: Secondary | ICD-10-CM | POA: Diagnosis not present

## 2019-05-12 DIAGNOSIS — Z85038 Personal history of other malignant neoplasm of large intestine: Secondary | ICD-10-CM | POA: Diagnosis not present

## 2019-05-12 DIAGNOSIS — Z8673 Personal history of transient ischemic attack (TIA), and cerebral infarction without residual deficits: Secondary | ICD-10-CM | POA: Diagnosis not present

## 2019-05-12 DIAGNOSIS — E119 Type 2 diabetes mellitus without complications: Secondary | ICD-10-CM | POA: Diagnosis not present

## 2019-05-12 DIAGNOSIS — Z794 Long term (current) use of insulin: Secondary | ICD-10-CM | POA: Insufficient documentation

## 2019-05-12 DIAGNOSIS — J45909 Unspecified asthma, uncomplicated: Secondary | ICD-10-CM | POA: Insufficient documentation

## 2019-05-12 DIAGNOSIS — Z888 Allergy status to other drugs, medicaments and biological substances status: Secondary | ICD-10-CM | POA: Insufficient documentation

## 2019-05-12 DIAGNOSIS — M7918 Myalgia, other site: Secondary | ICD-10-CM | POA: Diagnosis present

## 2019-05-12 DIAGNOSIS — N3000 Acute cystitis without hematuria: Secondary | ICD-10-CM

## 2019-05-12 DIAGNOSIS — E785 Hyperlipidemia, unspecified: Secondary | ICD-10-CM | POA: Insufficient documentation

## 2019-05-12 DIAGNOSIS — Z885 Allergy status to narcotic agent status: Secondary | ICD-10-CM | POA: Diagnosis not present

## 2019-05-12 DIAGNOSIS — I10 Essential (primary) hypertension: Secondary | ICD-10-CM | POA: Diagnosis not present

## 2019-05-12 DIAGNOSIS — Z7982 Long term (current) use of aspirin: Secondary | ICD-10-CM | POA: Insufficient documentation

## 2019-05-12 LAB — WET PREP, GENITAL
Clue Cells Wet Prep HPF POC: NONE SEEN
Sperm: NONE SEEN
Trich, Wet Prep: NONE SEEN
Yeast Wet Prep HPF POC: NONE SEEN

## 2019-05-12 LAB — URINALYSIS, MICROSCOPIC (REFLEX): RBC / HPF: NONE SEEN RBC/hpf (ref 0–5)

## 2019-05-12 LAB — URINALYSIS, ROUTINE W REFLEX MICROSCOPIC
Bilirubin Urine: NEGATIVE
Glucose, UA: NEGATIVE mg/dL
Hgb urine dipstick: NEGATIVE
Ketones, ur: 15 mg/dL — AB
Nitrite: POSITIVE — AB
Protein, ur: NEGATIVE mg/dL
Specific Gravity, Urine: >1.03 — ABNORMAL HIGH (ref 1.005–1.030)
pH: 5.5 (ref 5.0–8.0)

## 2019-05-12 LAB — RAPID HIV SCREEN (HIV 1/2 AB+AG)
HIV 1/2 Antibodies: NONREACTIVE
HIV-1 P24 Antigen - HIV24: NONREACTIVE

## 2019-05-12 LAB — RESPIRATORY PANEL BY RT PCR (FLU A&B, COVID)
Influenza A by PCR: NEGATIVE
Influenza B by PCR: NEGATIVE
SARS Coronavirus 2 by RT PCR: POSITIVE — AB

## 2019-05-12 MED ORDER — ONDANSETRON 4 MG PO TBDP: 4.0000 mg | ORAL_TABLET | Freq: Three times a day (TID) | ORAL | 0 refills | Status: DC | PRN

## 2019-05-12 MED ORDER — BENZONATATE 100 MG PO CAPS: 100.0000 mg | ORAL_CAPSULE | Freq: Three times a day (TID) | ORAL | 0 refills | Status: DC

## 2019-05-12 MED ORDER — CEPHALEXIN 500 MG PO CAPS: 500.0000 mg | ORAL_CAPSULE | Freq: Two times a day (BID) | ORAL | 0 refills | Status: AC

## 2019-05-12 NOTE — Discharge Instructions (Signed)
As discussed, you tested positive for COVID. You also have a UTI. COVID is a viral infection, so there are no antibiotics or specific treatment for it. I am sending you home with nausea medication and cough medication. Take as prescribed. Continue to self quarantine away from others. I am sending you home with an antibiotic for your UTI. Take as prescribed and finish all antibiotics. Follow-up with your PCP if your symptoms do not improve within the next week. Return to the ER for severe shortness of breath, chest pain, or worsening symptoms. Continue to drink a lot of water.

## 2019-05-12 NOTE — ED Notes (Addendum)
Pt complaining of odor to urine and "fullness" in bladder, concerned for UTI. EDP notified.

## 2019-05-12 NOTE — ED Provider Notes (Signed)
[facility] DEPARTMENT Provider Note   CSN: [id number] Arrival date & time: 05/12/19  1758     History Chief Complaint  Patient presents with  . Covid Symptoms    [name] is a 63 y.o. female with a past medical history significant for HTN, hx CVA, DM, hyperlipidemia, and history of colon cancer who presents to the ED due to gradual onset of worsening COVID-like symptoms x 2 weeks. Patient admits to body aches, headache, weakness, fatigue, and shortness of breath. Patient notes shortness of breath is only present with coughing and not with exertion or at rest. Patient admits to a dry cough. Patient has tried Tylenol for her symptoms with no relief. Patient's son is sick at home with similar symptoms. Patient also notes bladder pressure x 1 week associated with intermittent back pain and increased urination. Patient denies dysuria. She was last sexually active 1 month ago. She admits to increased vaginal discharge. Patient denies fever, abdominal pain, and chest pain.    Past Medical History:  Diagnosis Date  . Colon cancer ([place])   . Diabetes mellitus   . Stroke [facility])     Patient Active Problem List   Diagnosis Date Noted  . CVA, old, hemiparesis ([place]) 01/16/2019  . Type 2 diabetes mellitus without complication, with long-term current use of insulin ([place]) 01/16/2019  . Right patella fracture 01/16/2019  . Patellar fracture 01/15/2019  . Rectal bleeding 04/10/2012  . Supratherapeutic INR 04/10/2012  . Heme positive stool 04/10/2012  . Melena 11/07/2011  . Coagulopathy ([place]) 11/07/2011  . H/O colon cancer 11/07/2011  . OTHER ANAPHYLACTIC REACTION 06/20/2010  . ULCERATION OF INTESTINE 04/05/2010  . ANEMIA 04/02/2010  . SCABIES 07/06/2009  . UPPER RESPIRATORY INFECTION 07/06/2009  . PRURITUS, VAGINAL 01/25/2009  . URINARY INCONTINENCE 01/25/2009  . CANDIDIASIS, VAGINAL 11/06/2008  . EXTERNAL OTITIS 07/26/2008  . CHEST PAIN 05/05/2008  .  EPISTAXIS 10/07/2007  . COLONIC POLYPS, HX OF 02/09/2007  . ANEMIA, B12 DEFICIENCY 01/19/2007  . SINUSITIS, ACUTE NOS 01/19/2007  . HYPERLIPIDEMIA 11/10/2006  . Essential hypertension 11/10/2006  . Unspecified cerebral artery occlusion with cerebral infarction 06/02/2001  . PATENT FORAMEN [place] 04/21/2001  . DIABETES MELLITUS, TYPE II, UNCONTROLLED 09/01/1999    Past Surgical History:  Procedure Laterality Date  . CHOLECYSTECTOMY    . COLON SURGERY    . ESOPHAGOGASTRODUODENOSCOPY  11/07/2011   Procedure: ESOPHAGOGASTRODUODENOSCOPY (EGD);  Surgeon: Jerene Bears, MD;  Location: [facility];  Service: Gastroenterology;  Laterality: N/A;  . PATELLAR TENDON REPAIR N/A 01/15/2019   Procedure: PATELLA TENDON REPAIR;  Surgeon: Tania Ade, MD;  Location: [facility];  Service: Orthopedics;  Laterality: N/A;     OB History   No obstetric history on file.     Family History  Problem Relation Age of Onset  . Cancer Other   . Diabetes Other     Social History   Tobacco Use  . Smoking status: Never Smoker  . Smokeless tobacco: Never Used  Substance Use Topics  . Alcohol use: No  . Drug use: No    Home Medications Prior to Admission medications   Medication Sig Start Date End Date Taking? Authorizing Provider  acetaminophen (TYLENOL) 500 MG tablet Take 1 tablet (500 mg total) by mouth every 6 (six) hours as needed. 08/13/14   Domenic Moras, PA-C  aspirin EC 81 MG tablet Take 81 mg by mouth daily.    [provider]  benzonatate (TESSALON) 100 MG capsule Take 1  capsule (100 mg total) by mouth every 8 (eight) hours. 05/12/19   Cheek, Comer Locket, PA-C  cephALEXin (KEFLEX) 500 MG capsule Take 1 capsule (500 mg total) by mouth 2 (two) times daily for 7 days. 05/12/19 05/19/19  Cheek, Comer Locket, PA-C  docusate sodium (COLACE) 100 MG capsule Take 1 capsule (100 mg total) by mouth 2 (two) times daily. 01/17/19   Rai, Ripudeep K, MD  EPINEPHrine 0.3 mg/0.3 mL IJ SOAJ injection Inject  0.3 mLs (0.3 mg total) into the muscle as needed (Tongue swelling, anaphylaxis). 08/16/14   Evelina Bucy, MD  insulin aspart (NOVOLOG) 100 UNIT/ML injection Inject 30 Units into the skin 2 (two) times daily.     [provider]  insulin detemir (LEVEMIR) 100 UNIT/ML injection Inject 15 Units into the skin every evening.     [provider]  methocarbamol (ROBAXIN) 500 MG tablet Take 1 tablet (500 mg total) by mouth every 6 (six) hours as needed for muscle spasms. 01/17/19   Rai, Ripudeep Raliegh Ip, MD  ondansetron (ZOFRAN ODT) 4 MG disintegrating tablet Take 1 tablet (4 mg total) by mouth every 8 (eight) hours as needed for nausea or vomiting. 05/12/19   Cheek, Chrys Racer B, PA-C  ondansetron (ZOFRAN) 4 MG tablet Take 1 tablet (4 mg total) by mouth every 6 (six) hours as needed for nausea. 01/17/19   Rai, Ripudeep K, MD  oxyCODONE (OXY IR/ROXICODONE) 5 MG immediate release tablet Take 1 tablet (5 mg total) by mouth every 6 (six) hours as needed for moderate pain or severe pain. 01/17/19   Rai, Ripudeep K, MD  polyethylene glycol (MIRALAX / GLYCOLAX) 17 g packet Take 17 g by mouth daily as needed for moderate constipation. 01/17/19   Rai, Vernelle Emerald, MD  pravastatin (PRAVACHOL) 40 MG tablet Take 1 tablet (40 mg total) by mouth at bedtime. 01/17/19   Rai, Vernelle Emerald, MD    Allergies    Glipizide, Ibuprofen, Iohexol, Rosiglitazone-metformin, Hydrocodone-acetaminophen, Morphine, Pantoprazole, and Sulfamethoxazole  Review of Systems   Review of Systems  Constitutional: Positive for appetite change and fatigue. Negative for chills and fever.  HENT: Positive for rhinorrhea and sore throat. Negative for ear pain, facial swelling, sinus pressure, sinus pain, trouble swallowing and voice change.   Respiratory: Positive for cough and shortness of breath (only with cough).   Cardiovascular: Negative for chest pain.  Gastrointestinal: Negative for abdominal pain, diarrhea, nausea and vomiting.    Genitourinary: Positive for frequency and vaginal discharge. Negative for hematuria, pelvic pain, vaginal bleeding and vaginal pain.  Musculoskeletal: Positive for back pain. Negative for neck pain and neck stiffness.  Neurological: Positive for headaches. Negative for dizziness and light-headedness.    Physical Exam Updated Vital Signs BP 129/72   Pulse 88   Temp 98.6 F (37 C)   Resp 20   SpO2 94%   Physical Exam Vitals and nursing note reviewed. Exam conducted with a chaperone present.  Constitutional:      General: She is not in acute distress.    Appearance: She is not toxic-appearing.  HENT:     Head: Normocephalic.     Mouth/Throat:     Comments: Posterior oropharynx clear and mucous membranes moist, there is mild erythema but no edema or tonsillar exudates, uvula midline, normal phonation, no trismus, tolerating secretions without difficulty. Eyes:     Pupils: Pupils are equal, round, and reactive to light.  Cardiovascular:     Rate and Rhythm: Normal rate and regular rhythm.  Pulses: Normal pulses.     Heart sounds: Normal heart sounds. No murmur. No friction rub. No gallop.   Pulmonary:     Effort: Pulmonary effort is normal.     Breath sounds: Normal breath sounds.     Comments: Respirations equal and unlabored, patient able to speak in full sentences, lungs clear to auscultation bilaterally Abdominal:     General: Abdomen is flat. Bowel sounds are normal. There is no distension.     Palpations: Abdomen is soft.     Tenderness: There is no right CVA tenderness or left CVA tenderness.  Genitourinary:    Exam position: Supine.     Vagina: Vaginal discharge present. No tenderness or bleeding.     Cervix: Discharge present. No cervical motion tenderness, erythema or cervical bleeding.     Uterus: Normal.      Adnexa: Right adnexa normal and left adnexa normal.  Musculoskeletal:     Cervical back: Normal range of motion and neck supple. No rigidity or  tenderness.     Comments: Able to move all 4 extremities without difficulty. No lower extremity edema. Distal sensation and pulses intact bilaterally.  Skin:    General: Skin is warm and dry.  Neurological:     General: No focal deficit present.     Mental Status: She is alert.     ED Results / Procedures / Treatments   Labs (all labs ordered are listed, but only abnormal results are displayed) Labs Reviewed  RESPIRATORY PANEL BY RT PCR (FLU A&B, COVID) - Abnormal; Notable for the following components:      Result Value   SARS Coronavirus 2 by RT PCR POSITIVE (*)    All other components within normal limits  WET PREP, GENITAL - Abnormal; Notable for the following components:   WBC, Wet Prep HPF POC MANY (*)    All other components within normal limits  URINALYSIS, ROUTINE W REFLEX MICROSCOPIC - Abnormal; Notable for the following components:   APPearance CLOUDY (*)    Specific Gravity, Urine >1.030 (*)    Ketones, ur 15 (*)    Nitrite POSITIVE (*)    Leukocytes,Ua TRACE (*)    All other components within normal limits  URINALYSIS, MICROSCOPIC (REFLEX) - Abnormal; Notable for the following components:   Bacteria, UA MANY (*)    All other components within normal limits  RAPID HIV SCREEN (HIV 1/2 AB+AG)  RPR  GC/CHLAMYDIA PROBE AMP ([place]) NOT AT [facility]    EKG None  Radiology No results found.  Procedures Procedures (including critical care time)  Medications Ordered in ED Medications - No data to display  ED Course  I have reviewed the triage vital signs and the nursing notes.  Pertinent labs & imaging results that were available during my care of the patient were reviewed by me and considered in my medical decision making (see chart for details).    MDM Rules/Calculators/A&P     CHA2DS2/VAS Stroke Risk Points      N/A >= 2 Points: High Risk  1 - 1.99 Points: Medium Risk  0 Points: Low Risk    A final score could not be computed because of missing  components.: Last  Change: N/A     This score determines the patient's risk of having a stroke if the  patient has atrial fibrillation.      This score is not applicable to this patient. Components are not  calculated.  63 year old female presents to the ED for COVID-like symptoms. Denies chest pain. Shortness of breath only with cough. Stable vitals. Patient is afebrile, not tachycardic or hypoxic. Patient in no acute distress and non-toxic appearing. Lungs clear to auscultation bilaterally. Abdomen soft, non-distended, non-tender. Negative CVA tenderness bilaterally. Suspect symptoms are related to COVID infection vs. Influenza vs another viral infection. Will obtain COVID and influenza test. Patient ambulated in room with cane and maintained O2 saturations at 94% and above. Do not feel patient needs to be admitted for potential COVID infection, so will hold off on labs and CXR at this time given it will not change management.   UA significant for nitrites and leukocytes without hematuria likely due to acute cystitis. Will treat with Keflex. Wet prep negative for clue cells, yeast, or trichomonas, doubt vaginitis at this time. GC/chlamydia pending. COVID positive. Influenza negative.   Patient has been advised to self quarantine and tell all of her close contacts she has tested positive for COVID. Will treat symptomatically with zofran and cough medication. Patient has been advised to follow-up with her PCP within the next week if her symptoms do not improve. COVID information given to patient at discharge. Strict ED precautions discussed with patient. Patient states understanding and agrees to plan. Patient discharged home in no acute distress and stable vitals   Shukrona R Fire Krystal Lawrence was evaluated in Emergency Department on 05/13/2019 for the symptoms described in the history of present illness. She was evaluated in the context of the global COVID-19 pandemic, which  necessitated consideration that the patient might be at risk for infection with the SARS-CoV-2 virus that causes COVID-19. Institutional protocols and algorithms that pertain to the evaluation of patients at risk for COVID-19 are in a state of rapid change based on information released by regulatory bodies including the CDC and federal and state organizations. These policies and algorithms were followed during the patient's care in the ED.  Final Clinical Impression(s) / ED Diagnoses Final diagnoses:  Acute cystitis without hematuria  COVID-19 virus infection    Rx / DC Orders ED Discharge Orders         Ordered    cephALEXin (KEFLEX) 500 MG capsule  2 times daily     05/12/19 2236    ondansetron (ZOFRAN ODT) 4 MG disintegrating tablet  Every 8 hours PRN     05/12/19 2307    benzonatate (TESSALON) 100 MG capsule  Every 8 hours     05/12/19 2307           Romie Levee 05/13/19 Vicki Mallet, MD 05/13/19 1308

## 2019-05-12 NOTE — ED Triage Notes (Signed)
2 weeks of body aches, headache, weakness, fatigue,cough, loss of smell, loss of taste, runny nose, sore throat, sob and diarrhea.

## 2019-05-12 NOTE — ED Notes (Signed)
Pt ambulating in room with provider and cane, room air saturations 94% and above.

## 2019-05-12 NOTE — ED Notes (Signed)
ED Provider at bedside. 

## 2019-05-13 LAB — RPR: RPR Ser Ql: NONREACTIVE

## 2019-05-16 LAB — GC/CHLAMYDIA PROBE AMP (~~LOC~~) NOT AT ARMC
Chlamydia: NEGATIVE
Neisseria Gonorrhea: NEGATIVE

## 2019-07-30 ENCOUNTER — Other Ambulatory Visit: Payer: Self-pay

## 2019-07-30 ENCOUNTER — Ambulatory Visit (HOSPITAL_COMMUNITY)
Admission: EM | Admit: 2019-07-30 | Discharge: 2019-07-30 | Disposition: A | Discharge status: Home / Self Care | Payer: Medicare Other | Attending: Urgent Care | Admitting: Urgent Care

## 2019-07-30 ENCOUNTER — Encounter (HOSPITAL_COMMUNITY): Payer: Self-pay

## 2019-07-30 DIAGNOSIS — L84 Corns and callosities: Secondary | ICD-10-CM | POA: Insufficient documentation

## 2019-07-30 DIAGNOSIS — R300 Dysuria: Secondary | ICD-10-CM | POA: Diagnosis present

## 2019-07-30 DIAGNOSIS — N3001 Acute cystitis with hematuria: Secondary | ICD-10-CM

## 2019-07-30 DIAGNOSIS — R350 Frequency of micturition: Secondary | ICD-10-CM

## 2019-07-30 LAB — POCT URINALYSIS DIP (DEVICE)
Bilirubin Urine: NEGATIVE
Glucose, UA: >=1000 mg/dL — AB
Ketones, ur: NEGATIVE mg/dL
Nitrite: POSITIVE — AB
Protein, ur: NEGATIVE mg/dL
Specific Gravity, Urine: 1.02 (ref 1.005–1.030)
Urobilinogen, UA: 0.2 mg/dL (ref 0.0–1.0)
pH: 5.5 (ref 5.0–8.0)

## 2019-07-30 MED ORDER — CEPHALEXIN 500 MG PO CAPS: 500.0000 mg | ORAL_CAPSULE | Freq: Two times a day (BID) | ORAL | 0 refills | Status: DC

## 2019-07-30 NOTE — ED Triage Notes (Signed)
Pt present urinary frequency with a tingling feeling after she urinate. Symptoms started two days ago.

## 2019-07-30 NOTE — ED Provider Notes (Signed)
[facility]   MRN: [id number] DOB: 02-04-1956  Subjective:   [name] is a 64 y.o. female presenting for 2-day history of acute onset persistent urinary frequency, dysuria.  Patient has a history of UTI.  She also has concern about a sore on the underside of her right foot.  Patient has a history of surgery in the other leg and has to bear a lot more weight on her right leg.  Denies fever, nausea, vomiting, belly pain, redness, drainage of pus or bleeding.  Patient is a diabetic.  No current facility-administered medications for this encounter.  Current Outpatient Medications:  .  acetaminophen (TYLENOL) 500 MG tablet, Take 1 tablet (500 mg total) by mouth every 6 (six) hours as needed., Disp: 30 tablet, Rfl: 0 .  aspirin EC 81 MG tablet, Take 81 mg by mouth daily., Disp: , Rfl:  .  benzonatate (TESSALON) 100 MG capsule, Take 1 capsule (100 mg total) by mouth every 8 (eight) hours., Disp: 21 capsule, Rfl: 0 .  docusate sodium (COLACE) 100 MG capsule, Take 1 capsule (100 mg total) by mouth 2 (two) times daily., Disp: 10 capsule, Rfl: 0 .  EPINEPHrine 0.3 mg/0.3 mL IJ SOAJ injection, Inject 0.3 mLs (0.3 mg total) into the muscle as needed (Tongue swelling, anaphylaxis)., Disp: 1 Device, Rfl: 2 .  insulin aspart (NOVOLOG) 100 UNIT/ML injection, Inject 30 Units into the skin 2 (two) times daily. , Disp: , Rfl:  .  insulin detemir (LEVEMIR) 100 UNIT/ML injection, Inject 15 Units into the skin every evening. , Disp: , Rfl:  .  methocarbamol (ROBAXIN) 500 MG tablet, Take 1 tablet (500 mg total) by mouth every 6 (six) hours as needed for muscle spasms., Disp:  , Rfl:  .  ondansetron (ZOFRAN ODT) 4 MG disintegrating tablet, Take 1 tablet (4 mg total) by mouth every 8 (eight) hours as needed for nausea or vomiting., Disp: 20 tablet, Rfl: 0 .  ondansetron (ZOFRAN) 4 MG tablet, Take 1 tablet (4 mg total) by mouth every 6 (six) hours as needed for nausea., Disp: 20 tablet, Rfl: 0 .   oxyCODONE (OXY IR/ROXICODONE) 5 MG immediate release tablet, Take 1 tablet (5 mg total) by mouth every 6 (six) hours as needed for moderate pain or severe pain., Disp: 15 tablet, Rfl: 0 .  polyethylene glycol (MIRALAX / GLYCOLAX) 17 g packet, Take 17 g by mouth daily as needed for moderate constipation., Disp: 14 each, Rfl: 0 .  pravastatin (PRAVACHOL) 40 MG tablet, Take 1 tablet (40 mg total) by mouth at bedtime., Disp: 30 tablet, Rfl:    Allergies  Allergen Reactions  . Glipizide Nausea And Vomiting    REACTION: nausea and vomiting  . Ibuprofen Other (See Comments)    REACTION: tongue swells and dyspnea  . Iohexol      Code: HIVES, Desc: pt called day after IV contrast c/o red, swollen itching feet and red face that started 4hrs after ct, Onset Date: [id number]   . Rosiglitazone-Metformin     REACTION: rash--tolerates Metformin alone  . Hydrocodone-Acetaminophen Nausea And Vomiting and Rash    REACTION: vomiting and rash  . Morphine Rash    REACTION: rash  . Pantoprazole Palpitations  . Sulfamethoxazole Rash    REACTION: rash    Past Medical History:  Diagnosis Date  . Colon cancer ([place])   . Diabetes mellitus   . Stroke [facility])      Past Surgical History:  Procedure Laterality Date  . CHOLECYSTECTOMY    .  COLON SURGERY    . ESOPHAGOGASTRODUODENOSCOPY  11/07/2011   Procedure: ESOPHAGOGASTRODUODENOSCOPY (EGD);  Surgeon: Jerene Bears, MD;  Location: [facility];  Service: Gastroenterology;  Laterality: N/A;  . PATELLAR TENDON REPAIR N/A 01/15/2019   Procedure: PATELLA TENDON REPAIR;  Surgeon: Tania Ade, MD;  Location: [facility];  Service: Orthopedics;  Laterality: N/A;    Family History  Problem Relation Age of Onset  . Cancer Other   . Diabetes Other     Social History   Tobacco Use  . Smoking status: Never Smoker  . Smokeless tobacco: Never Used  Substance Use Topics  . Alcohol use: No  . Drug use: No    ROS   Objective:   Vitals: BP (!) 143/68 (BP  Location: Right Arm)   Pulse 87   Temp 97.8 F (36.6 C) (Oral)   Resp 16   SpO2 100%   Physical Exam Constitutional:      General: She is not in acute distress.    Appearance: Normal appearance. She is well-developed. She is not ill-appearing, toxic-appearing or diaphoretic.  HENT:     Head: Normocephalic and atraumatic.     Nose: Nose normal.     Mouth/Throat:     Mouth: Mucous membranes are moist.     Pharynx: Oropharynx is clear.  Eyes:     General: No scleral icterus.    Extraocular Movements: Extraocular movements intact.     Pupils: Pupils are equal, round, and reactive to light.  Cardiovascular:     Rate and Rhythm: Normal rate.  Pulmonary:     Effort: Pulmonary effort is normal.  Abdominal:     Tenderness: There is no right CVA tenderness or left CVA tenderness.  Musculoskeletal:       Legs:  Skin:    General: Skin is warm and dry.  Neurological:     General: No focal deficit present.     Mental Status: She is alert and oriented to person, place, and time.  Psychiatric:        Mood and Affect: Mood normal.        Behavior: Behavior normal.        Thought Content: Thought content normal.        Judgment: Judgment normal.     Results for orders placed or performed during the hospital encounter of 07/30/19 (from the past 24 hour(s))  POCT urinalysis dip (device)     Status: Abnormal   Collection Time: 07/30/19 12:48 PM  Result Value Ref Range   Glucose, UA >=1000 (A) NEGATIVE mg/dL   Bilirubin Urine NEGATIVE NEGATIVE   Ketones, ur NEGATIVE NEGATIVE mg/dL   Specific Gravity, Urine 1.020 1.005 - 1.030   Hgb urine dipstick TRACE (A) NEGATIVE   pH 5.5 5.0 - 8.0   Protein, ur NEGATIVE NEGATIVE mg/dL   Urobilinogen, UA 0.2 0.0 - 1.0 mg/dL   Nitrite POSITIVE (A) NEGATIVE   Leukocytes,Ua TRACE (A) NEGATIVE    Assessment and Plan :   1. Acute cystitis with hematuria   2. Urinary frequency   3. Dysuria   4. Callus of foot     Start Keflex, urine culture  pending.  Recommended monitoring of her callus.  Keflex can address skin infections as well which I discussed with patient.  She is to maintain close follow-up with her PCP and return to clinic if symptoms worsen. Counseled patient on potential for adverse effects with medications prescribed/recommended today, ER and return-to-clinic precautions discussed, patient verbalized understanding.  Jaynee Eagles, PA-C 07/30/19 1328

## 2019-08-01 LAB — URINE CULTURE: Culture: 80000 — AB

## 2019-08-24 ENCOUNTER — Encounter: Payer: Self-pay | Admitting: Internal Medicine

## 2019-09-14 ENCOUNTER — Ambulatory Visit (AMBULATORY_SURGERY_CENTER): Payer: Self-pay

## 2019-09-14 ENCOUNTER — Other Ambulatory Visit: Payer: Self-pay

## 2019-09-14 VITALS — Temp 97.8°F | Ht 63.5 in | Wt 169.8 lb

## 2019-09-14 DIAGNOSIS — Z01818 Encounter for other preprocedural examination: Secondary | ICD-10-CM

## 2019-09-14 DIAGNOSIS — Z86010 Personal history of colonic polyps: Secondary | ICD-10-CM

## 2019-09-14 DIAGNOSIS — Z800 Family history of malignant neoplasm of digestive organs: Secondary | ICD-10-CM

## 2019-09-14 MED ORDER — SUTAB 1479-225-188 MG PO TABS: 12.0000 | ORAL_TABLET | ORAL | 0 refills | Status: DC

## 2019-09-14 NOTE — Progress Notes (Signed)
No allergies to soy or egg Pt is not on blood thinners or diet pills Denies issues with sedation/intubation Denies atrial flutter/fib Denies constipation   Emmi instructions given to pt  Pt is aware of Covid safety and care partner requirements.  

## 2019-09-23 ENCOUNTER — Other Ambulatory Visit: Payer: Self-pay | Admitting: Internal Medicine

## 2019-09-23 ENCOUNTER — Other Ambulatory Visit: Payer: Self-pay

## 2019-09-23 ENCOUNTER — Ambulatory Visit (INDEPENDENT_AMBULATORY_CARE_PROVIDER_SITE_OTHER): Payer: Medicare (Managed Care)

## 2019-09-23 DIAGNOSIS — Z1159 Encounter for screening for other viral diseases: Secondary | ICD-10-CM

## 2019-09-24 LAB — SARS CORONAVIRUS 2 (TAT 6-24 HRS): SARS Coronavirus 2: NEGATIVE

## 2019-09-26 ENCOUNTER — Telehealth: Payer: Self-pay | Admitting: Internal Medicine

## 2019-09-26 DIAGNOSIS — Z86010 Personal history of colonic polyps: Secondary | ICD-10-CM

## 2019-09-26 DIAGNOSIS — Z800 Family history of malignant neoplasm of digestive organs: Secondary | ICD-10-CM

## 2019-09-26 MED ORDER — SUTAB 1479-225-188 MG PO TABS: 24.0000 | ORAL_TABLET | ORAL | 0 refills | Status: DC

## 2019-09-26 NOTE — Telephone Encounter (Signed)
Resent Sutab to pharmacy at pt's request as not thereLelan Pons [facility]

## 2019-09-28 ENCOUNTER — Telehealth: Payer: Self-pay | Admitting: Internal Medicine

## 2019-09-28 ENCOUNTER — Encounter: Payer: Medicare Other | Admitting: Internal Medicine

## 2019-09-28 NOTE — Telephone Encounter (Signed)
Informed Dr. Hilarie Fredrickson of pt. Concerns of not being able to complete prep and episodes of vomiting x3 and still have droplets of stool,recieved verbal order to reschedule pt. And for her to have reglan for the nausea and vomiting. Procedure rescheduled for 10/11/19 and nurse visit scheduled for 10/06/19,instructed pt. To check in on 3rd floor for nurse visit she verbalize understanding.

## 2019-09-28 NOTE — Telephone Encounter (Signed)
Pls call pt. She had to do a 2-day prep with Miralax and sutab. She states that miralax made her very sick, she was vomiting after taking it. Pt was not able to do sutab prep because she could not pick it up at her pharmacy yesterday due to feeling very sick. Her procedure is schedule this morning at 9:30am.

## 2019-09-28 NOTE — Telephone Encounter (Signed)
Tried to contact patient about prep. Was unable to reach patient. Voicemail left for her to call office back.

## 2019-10-06 ENCOUNTER — Ambulatory Visit: Payer: Medicare (Managed Care) | Admitting: *Deleted

## 2019-10-06 ENCOUNTER — Other Ambulatory Visit: Payer: Self-pay

## 2019-10-06 VITALS — Temp 96.8°F | Ht 63.5 in | Wt 169.2 lb

## 2019-10-06 DIAGNOSIS — Z85038 Personal history of other malignant neoplasm of large intestine: Secondary | ICD-10-CM

## 2019-10-06 DIAGNOSIS — Z01818 Encounter for other preprocedural examination: Secondary | ICD-10-CM

## 2019-10-06 MED ORDER — METOCLOPRAMIDE HCL 10 MG PO TABS: ORAL_TABLET | ORAL | 0 refills | Status: DC

## 2019-10-06 NOTE — Progress Notes (Signed)

## 2019-10-11 ENCOUNTER — Encounter: Payer: Medicare (Managed Care) | Admitting: Internal Medicine

## 2019-10-28 ENCOUNTER — Ambulatory Visit: Payer: Self-pay | Admitting: Podiatry

## 2019-11-18 ENCOUNTER — Ambulatory Visit (INDEPENDENT_AMBULATORY_CARE_PROVIDER_SITE_OTHER): Payer: Medicare (Managed Care) | Admitting: Podiatry

## 2019-11-18 ENCOUNTER — Other Ambulatory Visit: Payer: Self-pay

## 2019-11-18 VITALS — Temp 96.8°F

## 2019-11-18 DIAGNOSIS — L84 Corns and callosities: Secondary | ICD-10-CM

## 2019-11-18 DIAGNOSIS — M2031 Hallux varus (acquired), right foot: Secondary | ICD-10-CM

## 2019-11-18 DIAGNOSIS — Z794 Long term (current) use of insulin: Secondary | ICD-10-CM

## 2019-11-18 DIAGNOSIS — E119 Type 2 diabetes mellitus without complications: Secondary | ICD-10-CM

## 2019-11-22 ENCOUNTER — Encounter: Payer: Self-pay | Admitting: Podiatry

## 2019-11-22 NOTE — Progress Notes (Signed)
Subjective:  Patient ID: [name], female    DOB: November 28, 1955,  MRN: [id number]  Chief Complaint  Patient presents with  . Foot Problem    R hallux. x6 months. Pt stated, "A piece of hard skin developed on the bottom of my toe, so I pulled it off. It turned into an open sore, which took 2 months to heal. It never became infected, so I didn't go to a doctor. A callus formed over it, and it was painful. It's not painful anymore, but I also have neuropathy. I walk differently because I had a left-sided CVA and R knee surgery".    64 y.o. female presents with the above complaint.  Patient presents with follow-up of right hallux preulcerative lesion.  Patient states that there is hyperkeratotic lesion been present for quite some time.  She wants to get evaluated make sure that there is an open sore associated with it.  Her A1c is 8 patient is a type II diabetic.  She does not have pain as patient has neuropathy.  She states her gait has changed because of right knee surgery.  She denies any other acute complaints.  She has had a history of ulceration that took forever to heal.  She has not seen anyone else prior to seeing me.  Her pain scale 0 out of 10.   Review of Systems: Negative except as noted in the HPI. Denies N/V/F/Ch.  Past Medical History:  Diagnosis Date  . Anemia   . Colon cancer ([place])   . Diabetes mellitus   . GERD (gastroesophageal reflux disease)   . Hyperlipidemia   . Stroke [facility])    2003 old  . 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  acetaminophen (TYLENOL) 500 MG tablet, Take 1 tablet (500 mg total) by mouth every 6 (six) hours as needed., Disp: 30 tablet, Rfl: 0 .  aspirin EC 81 MG tablet, Take 81 mg by mouth daily., Disp: , Rfl:  .  EPINEPHrine 0.3 mg/0.3 mL IJ SOAJ injection, Inject 0.3 mLs (0.3 mg total) into the muscle as needed (Tongue swelling, anaphylaxis)., Disp: 1 Device, Rfl: 2 .  insulin aspart (NOVOLOG)  100 UNIT/ML injection, Inject 30 Units into the skin 2 (two) times daily. , Disp: , Rfl:  .  insulin detemir (LEVEMIR) 100 UNIT/ML injection, Inject 15 Units into the skin every evening. , Disp: , Rfl:  .  ONETOUCH VERIO test strip, , Disp: , Rfl:  .  metoCLOPramide (REGLAN) 10 MG tablet, Take as directed per prep instructions (Patient not taking: Reported on 11/18/2019), Disp: 3 tablet, Rfl: 0  Social History   Tobacco Use  Smoking Status Never Smoker  Smokeless Tobacco Never Used    Allergies  Allergen Reactions  . Glipizide Nausea And Vomiting    REACTION: nausea and vomiting  . Ibuprofen Other (See Comments)    REACTION: tongue swells and dyspnea  . Iohexol      Code: HIVES, Desc: pt called day after IV contrast c/o red, swollen itching feet and red face that started 4hrs after ct, Onset Date: [id number]   . Rosiglitazone-Metformin     REACTION: rash--tolerates Metformin alone  . Hydrocodone-Acetaminophen Nausea And Vomiting and Rash    REACTION: vomiting and rash  . Morphine Rash    REACTION: rash  . Pantoprazole Palpitations  . Sulfamethoxazole Rash    REACTION: rash   Objective:   Vitals:   11/18/19  1023  Temp: (!) 96.8 F (36 C)   There is no height or weight on file to calculate BMI. Constitutional Well developed. Well nourished.  Vascular Dorsalis pedis pulses palpable bilaterally. Posterior tibial pulses palpable bilaterally. Capillary refill normal to all digits.  No cyanosis or clubbing noted. Pedal hair growth normal.  Neurologic Normal speech. Oriented to person, place, and time. Epicritic sensation to light touch grossly present bilaterally.  Dermatologic  right hallux preulcerative lesion/hyperkeratotic lesion.  The lesion was debrided down to healthy striated tissue.  No open wound noted.  No clinical signs of infection noted.  No pinpoint bleeding noted.  Orthopedic: Normal joint ROM without pain or crepitus bilaterally. No visible deformities. No  bony tenderness.   Radiographs: None Assessment:   1. Hallux malleus of right foot   2. Pre-ulcerative calluses   3. Type 2 diabetes mellitus without complication, with long-term current use of insulin ([place])    Plan:  Patient was evaluated and treated and all questions answered.  Right hallux preulcerative lesion -I explained patient the etiology of preulcerative lesion and various treatment options were discussed.  This likely due to the changes in her gait mechanics putting excessive pressure to the right hallux plantar foot.  Given that her sugars are uncontrolled with last A1c of 8 patient is very close to opening of wound.  I discussed with her the importance of offloading as well as wearing surgical shoe.  I believe patient will benefit from surgical shoe to offload. -Surgical shoe was dispensed.  Hallux malleus contracture -I explained patient the etiology of hallux malleus contracture and varus treatment options were discussed.  I believe patient will benefit from diabetic shoes as patient has a preulcerative lesion with underlying contracture.  Patient will be scheduled see rec for diabetic shoes   Return for Sched with Liliane Channel for Diabetic shoes/insoles.

## 2019-11-25 ENCOUNTER — Other Ambulatory Visit: Payer: Medicare (Managed Care) | Admitting: Orthotics

## 2019-12-02 ENCOUNTER — Other Ambulatory Visit: Payer: Self-pay

## 2019-12-02 ENCOUNTER — Ambulatory Visit: Payer: Medicare (Managed Care) | Admitting: Podiatry

## 2019-12-02 DIAGNOSIS — M2031 Hallux varus (acquired), right foot: Secondary | ICD-10-CM | POA: Diagnosis not present

## 2019-12-02 DIAGNOSIS — L84 Corns and callosities: Secondary | ICD-10-CM

## 2019-12-02 DIAGNOSIS — Z794 Long term (current) use of insulin: Secondary | ICD-10-CM

## 2019-12-02 DIAGNOSIS — E119 Type 2 diabetes mellitus without complications: Secondary | ICD-10-CM | POA: Diagnosis not present

## 2019-12-06 ENCOUNTER — Encounter: Payer: Self-pay | Admitting: Podiatry

## 2019-12-06 NOTE — Progress Notes (Signed)
Subjective:  Patient ID: [name], female    DOB: 04/09/1956,  MRN: [id number]  Chief Complaint  Patient presents with  . Foot Pain    pt is here for foot pain, pt states that the right foot is doing a lot better since the last time she was here. Pt states that she never saw Liliane Channel for her diabetic shoes appointment.    64 y.o. female presents with the above complaint.  Patient is following up for right hallux preulcerative lesion.  Patient is doing a lot better.  She states she has been wearing the surgical shoe which has helped a lot.  She does not have any pain.  She missed her appointment for diabetic shoes however she has rescheduled it.  She denies any other acute complaints.   Review of Systems: Negative except as noted in the HPI. Denies N/V/F/Ch.  Past Medical History:  Diagnosis Date  . Anemia   . Colon cancer ([place])   . Diabetes mellitus   . GERD (gastroesophageal reflux disease)   . Hyperlipidemia   . Stroke [facility])    2003 old  . 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  acetaminophen (TYLENOL) 500 MG tablet, Take 1 tablet (500 mg total) by mouth every 6 (six) hours as needed., Disp: 30 tablet, Rfl: 0 .  aspirin EC 81 MG tablet, Take 81 mg by mouth daily., Disp: , Rfl:  .  EPINEPHrine 0.3 mg/0.3 mL IJ SOAJ injection, Inject 0.3 mLs (0.3 mg total) into the muscle as needed (Tongue swelling, anaphylaxis)., Disp: 1 Device, Rfl: 2 .  insulin aspart (NOVOLOG) 100 UNIT/ML injection, Inject 30 Units into the skin 2 (two) times daily. , Disp: , Rfl:  .  insulin detemir (LEVEMIR) 100 UNIT/ML injection, Inject 15 Units into the skin every evening. , Disp: , Rfl:  .  metoCLOPramide (REGLAN) 10 MG tablet, Take as directed per prep instructions, Disp: 3 tablet, Rfl: 0 .  ONETOUCH VERIO test strip, , Disp: , Rfl:  .  sulfamethoxazole-trimethoprim (BACTRIM DS) 800-160 MG tablet, Take 1 tablet by mouth 2 (two) times daily., Disp: ,  Rfl:   Social History   Tobacco Use  Smoking Status Never Smoker  Smokeless Tobacco Never Used    Allergies  Allergen Reactions  . Glipizide Nausea And Vomiting    REACTION: nausea and vomiting  . Ibuprofen Other (See Comments)    REACTION: tongue swells and dyspnea  . Iohexol      Code: HIVES, Desc: pt called day after IV contrast c/o red, swollen itching feet and red face that started 4hrs after ct, Onset Date: [id number]   . Rosiglitazone-Metformin     REACTION: rash--tolerates Metformin alone  . Hydrocodone-Acetaminophen Nausea And Vomiting and Rash    REACTION: vomiting and rash  . Morphine Rash    REACTION: rash  . Pantoprazole Palpitations  . Sulfamethoxazole Rash    REACTION: rash   Objective:   There were no vitals filed for this visit. There is no height or weight on file to calculate BMI. Constitutional Well developed. Well nourished.  Vascular Dorsalis pedis pulses palpable bilaterally. Posterior tibial pulses palpable bilaterally. Capillary refill normal to all digits.  No cyanosis or clubbing noted. Pedal hair growth normal.  Neurologic Normal speech. Oriented to person, place, and time. Epicritic sensation to light touch grossly present bilaterally.  Dermatologic  right hallux preulcerative lesion/hyperkeratotic lesion.  Lesion has resolved down to  completely reepithelialized skin tissue..  No open wound noted.  No clinical signs of infection noted.  No pinpoint bleeding noted.  Orthopedic: Normal joint ROM without pain or crepitus bilaterally. No visible deformities. No bony tenderness.   Radiographs: None Assessment:   1. Hallux malleus of right foot   2. Pre-ulcerative calluses   3. Type 2 diabetes mellitus without complication, with long-term current use of insulin ([place])    Plan:  Patient was evaluated and treated and all questions answered.  Right hallux preulcerative lesion -Completely resolved.  Patient will continue with surgical shoe for  now.  Patient missed her scheduled for diabetic shoes however she has rescheduled the appointment. -I explained to her to continue monitoring this lesion as it can develop into ulceration right away.  Patient states understanding. -I have asked her to come back and see me right away if the ulceration develops.  Patient states understanding and will do so.  Hallux malleus contracture -I explained patient the etiology of hallux malleus contracture and varus treatment options were discussed.  I believe patient will benefit from diabetic shoes as patient has a preulcerative lesion with underlying contracture.  Patient will be scheduled see rec for diabetic shoes   No follow-ups on file.

## 2019-12-09 ENCOUNTER — Other Ambulatory Visit: Payer: Self-pay | Admitting: Internal Medicine

## 2019-12-09 DIAGNOSIS — Z1231 Encounter for screening mammogram for malignant neoplasm of breast: Secondary | ICD-10-CM

## 2019-12-19 ENCOUNTER — Ambulatory Visit: Payer: Medicare (Managed Care) | Admitting: Orthotics

## 2019-12-29 ENCOUNTER — Ambulatory Visit: Payer: Medicare (Managed Care) | Admitting: Orthotics

## 2020-01-10 ENCOUNTER — Ambulatory Visit: Payer: Medicare (Managed Care) | Admitting: Orthotics

## 2020-01-23 ENCOUNTER — Other Ambulatory Visit: Payer: Self-pay

## 2020-01-23 ENCOUNTER — Ambulatory Visit (HOSPITAL_COMMUNITY)
Admission: EM | Admit: 2020-01-23 | Discharge: 2020-01-23 | Disposition: A | Discharge status: Home / Self Care | Payer: Medicare (Managed Care) | Attending: Family Medicine | Admitting: Family Medicine

## 2020-01-23 ENCOUNTER — Encounter (HOSPITAL_COMMUNITY): Payer: Self-pay

## 2020-01-23 DIAGNOSIS — H1089 Other conjunctivitis: Secondary | ICD-10-CM | POA: Diagnosis present

## 2020-01-23 DIAGNOSIS — R739 Hyperglycemia, unspecified: Secondary | ICD-10-CM

## 2020-01-23 DIAGNOSIS — R350 Frequency of micturition: Secondary | ICD-10-CM

## 2020-01-23 LAB — POCT URINALYSIS DIPSTICK, ED / UC
Bilirubin Urine: NEGATIVE
Glucose, UA: >=1000 mg/dL — AB
Hgb urine dipstick: NEGATIVE
Ketones, ur: NEGATIVE mg/dL
Leukocytes,Ua: NEGATIVE
Nitrite: NEGATIVE
Protein, ur: NEGATIVE mg/dL
Specific Gravity, Urine: >=1.03 (ref 1.005–1.030)
Urobilinogen, UA: 1 mg/dL (ref 0.0–1.0)
pH: 5.5 (ref 5.0–8.0)

## 2020-01-23 LAB — CBG MONITORING, ED: Glucose-Capillary: 333 mg/dL — ABNORMAL HIGH (ref 70–99)

## 2020-01-23 MED ORDER — TOBRAMYCIN 0.3 % OP SOLN: 1.0000 [drp] | OPHTHALMIC | 0 refills | Status: DC

## 2020-01-23 NOTE — ED Provider Notes (Signed)
[name]    CSN: [id number] Arrival date & time: 01/23/20  1736      History   Chief Complaint Chief Complaint  Patient presents with  . Eye Problem  . Urinary Tract Infection    HPI [name] [name] is a 64 y.o. female.   HPI  Patient is here with 2 complaints.  First she has discomfort with urination and urinary frequency.  She is a diabetic.  She states her control has not been optimal.  She cannot tell me what her sugar is today.  No abdominal pain or pressure.  No flank pain.  No nausea or vomiting.  No fever or chills.  No history of kidney problems.  In addition she has had some intermittent blurry vision in her right eye.  Some feeling of ear fullness in the right eye.  She thinks she might be coming down with an eye infection.  No purulent discharge.  No runny stuffy nose.  No cold symptoms.  No known exposure to illness. Patient has not yet had her Covid vaccinations.  She is encouraged to do so  Past Medical History:  Diagnosis Date  . Anemia   . Colon cancer ([place])   . Diabetes mellitus   . GERD (gastroesophageal reflux disease)   . Hyperlipidemia   . Stroke [facility])    2003 old  . 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 CVA, old, hemiparesis ([place]) 01/16/2019  . Type 2 diabetes mellitus without complication, with long-term current use of insulin ([place]) 01/16/2019  . Right patella fracture 01/16/2019  . Patellar fracture 01/15/2019  . Rectal bleeding 04/10/2012  . Supratherapeutic INR 04/10/2012  . Heme positive stool 04/10/2012  . Melena 11/07/2011  . Coagulopathy ([place]) 11/07/2011  . H/O colon cancer 11/07/2011  . OTHER ANAPHYLACTIC REACTION 06/20/2010  . ULCERATION OF INTESTINE 04/05/2010  . ANEMIA 04/02/2010  . SCABIES 07/06/2009  . UPPER RESPIRATORY INFECTION 07/06/2009  . PRURITUS, VAGINAL 01/25/2009  . URINARY INCONTINENCE 01/25/2009  . CANDIDIASIS, VAGINAL 11/06/2008   . EXTERNAL OTITIS 07/26/2008  . CHEST PAIN 05/05/2008  . EPISTAXIS 10/07/2007  . COLONIC POLYPS, HX OF 02/09/2007  . ANEMIA, B12 DEFICIENCY 01/19/2007  . SINUSITIS, ACUTE NOS 01/19/2007  . HYPERLIPIDEMIA 11/10/2006  . Essential hypertension 11/10/2006  . Unspecified cerebral artery occlusion with cerebral infarction 06/02/2001  . PATENT FORAMEN OVALE 04/21/2001  . DIABETES MELLITUS, TYPE II, UNCONTROLLED 09/01/1999    Past Surgical History:  Procedure Laterality Date  . CHOLECYSTECTOMY    . COLON SURGERY    . COLONOSCOPY     2012  . ESOPHAGOGASTRODUODENOSCOPY  11/07/2011   Procedure: ESOPHAGOGASTRODUODENOSCOPY (EGD);  Surgeon: Jerene Bears, MD;  Location: [facility];  Service: Gastroenterology;  Laterality: N/A;  . PARTIAL HYSTERECTOMY    . PATELLAR TENDON REPAIR N/A 01/15/2019   Procedure: PATELLA TENDON REPAIR;  Surgeon: Tania Ade, MD;  Location: [facility];  Service: Orthopedics;  Laterality: N/A;  . UPPER GASTROINTESTINAL ENDOSCOPY      OB History   No obstetric history on file.      Home Medications    Prior to Admission medications   Medication Sig Start Date End Date Taking? Authorizing Provider  acetaminophen (TYLENOL) 500 MG tablet Take 1 tablet (500 mg total) by mouth every 6 (six) hours as needed. 08/13/14   Domenic Moras, PA-C  aspirin EC 81 MG tablet Take 81 mg  by mouth daily.    [provider]  EPINEPHrine 0.3 mg/0.3 mL IJ SOAJ injection Inject 0.3 mLs (0.3 mg total) into the muscle as needed (Tongue swelling, anaphylaxis). 08/16/14   Evelina Bucy, MD  insulin aspart (NOVOLOG) 100 UNIT/ML injection Inject 30 Units into the skin 2 (two) times daily.     [provider]  insulin detemir (LEVEMIR) 100 UNIT/ML injection Inject 15 Units into the skin every evening.     [provider]  metoCLOPramide (REGLAN) 10 MG tablet Take as directed per prep instructions 10/06/19   Pyrtle, Lajuan Lines, MD  [facility] VERIO test strip  09/23/19   [provider]  sulfamethoxazole-trimethoprim (BACTRIM DS) 800-160 MG tablet Take 1 tablet by mouth 2 (two) times daily. 11/08/19   [provider]  tobramycin (TOBREX) 0.3 % ophthalmic solution Place 1 drop into the right eye every 4 (four) hours. 01/23/20   Raylene Everts, MD    Family History Family History  Problem Relation Age of Onset  . Cancer Other   . Diabetes Other   . Colon cancer Mother 25  . Colon polyps Neg Hx   . Esophageal cancer Neg Hx   . Stomach cancer Neg Hx   . Rectal cancer Neg Hx     Social History Social History   Tobacco Use  . Smoking status: Never Smoker  . Smokeless tobacco: Never Used  Vaping Use  . Vaping Use: Never used  Substance Use Topics  . Alcohol use: Yes    Comment: occasional glass of wine  . Drug use: No     Allergies   Glipizide, Ibuprofen, Iohexol, Rosiglitazone-metformin, Hydrocodone-acetaminophen, Morphine, and Pantoprazole   Review of Systems Review of Systems See HPI  Physical Exam Triage Vital Signs ED Triage Vitals  Enc Vitals Group     BP 01/23/20 1918 (!) 115/58     Pulse Rate 01/23/20 1918 85     Resp 01/23/20 1918 18     Temp 01/23/20 1918 98.1 F (36.7 C)     Temp Source 01/23/20 1918 Oral     SpO2 01/23/20 1918 97 %     Weight --      Height --      Head Circumference --      Peak Flow --      Pain Score 01/23/20 1916 4     Pain Loc --      Pain Edu? --      Excl. in [place]? --    No data found.  Updated Vital Signs BP (!) 115/58 (BP Location: Right Arm)   Pulse 85   Temp 98.1 F (36.7 C) (Oral)   Resp 18   SpO2 97%   Physical Exam Constitutional:      General: She is not in acute distress.    Appearance: She is well-developed and normal weight.     Comments: Very antalgic gait.  Toe touches on left foot.  Holds left arm close to body.  HENT:     Head: Normocephalic and atraumatic.     Mouth/Throat:     Comments: Mask is in place Eyes:     Conjunctiva/sclera: Conjunctivae  normal.     Pupils: Pupils are equal, round, and reactive to light.     Comments: Eye exam appears normal.  No injection.  No discharge.  Good pupillary reflex.  Normal anterior chamber.  Cardiovascular:     Rate and Rhythm: Normal rate and regular rhythm.  Heart sounds: Normal heart sounds.  Pulmonary:     Effort: Pulmonary effort is normal. No respiratory distress.     Breath sounds: No rales.  Abdominal:     General: There is no distension.     Palpations: Abdomen is soft.     Tenderness: There is no right CVA tenderness or left CVA tenderness.  Musculoskeletal:        General: Normal range of motion.     Cervical back: Normal range of motion.  Skin:    General: Skin is warm and dry.  Neurological:     Mental Status: She is alert.  Psychiatric:        Behavior: Behavior normal.      UC Treatments / Results  Labs (all labs ordered are listed, but only abnormal results are displayed) Labs Reviewed  POCT URINALYSIS DIPSTICK, ED / UC - Abnormal; Notable for the following components:      Result Value   Glucose, UA >=1000 (*)    All other components within normal limits  CBG MONITORING, ED - Abnormal; Notable for the following components:   Glucose-Capillary 333 (*)    All other components within normal limits  URINE CULTURE    EKG   Radiology No results found.  Procedures Procedures (including critical care time)  Medications Ordered in UC Medications - No data to display  Initial Impression / Assessment and Plan / UC Course  I have reviewed the triage vital signs and the nursing notes.  Pertinent labs & imaging results that were available during my care of the patient were reviewed by me and considered in my medical decision making (see chart for details).     Urinalysis shows large amount of sugar.  High specific gravity at 1030.  I told the patient that she needs to manage her sugar better and drink more water.  I do not think she has cystitis at this  time.  I will do a culture based on her symptoms. Her blurriness in her arm may also be from her elevated sugar.  She does have a lower redness and still keeps rubbing her eye.  I will give her eyedrops to use for a couple of days.  She is advised to see an eye specialist if she is not improved over the next day or 2 Needs to follow-up with her PCP Final Clinical Impressions(s) / UC Diagnoses   Final diagnoses:  Hyperglycemia  Urination frequency  Other conjunctivitis of right eye     Discharge Instructions     Talk to your diabetes tomorrow about changes in your insulin to bring your sugar down.  Your high sugar is causing you to have frequent urination and blurred vision I am going to prescribe an eyedrop to use for a few days We will call you if your urine culture is positive Follow-up with your primary care doctor    ED Prescriptions    Medication Sig Dispense Auth. Provider   tobramycin (TOBREX) 0.3 % ophthalmic solution Place 1 drop into the right eye every 4 (four) hours. 5 mL Raylene Everts, MD     PDMP not reviewed this encounter.   Raylene Everts, MD 01/23/20 2122

## 2020-01-23 NOTE — ED Triage Notes (Signed)
Pt presents with cloudy vision on right side with no pain X 2 days; Pt also complains of uncomfortable pressure when urinating.

## 2020-01-23 NOTE — Discharge Instructions (Signed)
Talk to your diabetes tomorrow about changes in your insulin to bring your sugar down.  Your high sugar is causing you to have frequent urination and blurred vision I am going to prescribe an eyedrop to use for a few days We will call you if your urine culture is positive Follow-up with your primary care doctor

## 2020-01-26 ENCOUNTER — Telehealth (HOSPITAL_COMMUNITY): Payer: Self-pay | Admitting: *Deleted

## 2020-01-26 LAB — URINE CULTURE: Culture: >=100000 — AB

## 2020-01-26 MED ORDER — SULFAMETHOXAZOLE-TRIMETHOPRIM 800-160 MG PO TABS: 1.0000 | ORAL_TABLET | Freq: Two times a day (BID) | ORAL | 0 refills | Status: AC

## 2020-01-26 NOTE — Telephone Encounter (Signed)
Culture positive for UTI. Will send Bactrim PO BID x 3 days to pharmacy. Updated patient on results. Patient reports pressure and discomfort continues.

## 2020-01-27 ENCOUNTER — Ambulatory Visit: Payer: Medicare (Managed Care) | Admitting: Podiatry

## 2020-05-20 ENCOUNTER — Encounter (HOSPITAL_BASED_OUTPATIENT_CLINIC_OR_DEPARTMENT_OTHER): Payer: Self-pay | Admitting: Emergency Medicine

## 2020-05-20 ENCOUNTER — Emergency Department (HOSPITAL_BASED_OUTPATIENT_CLINIC_OR_DEPARTMENT_OTHER)
Admission: EM | Admit: 2020-05-20 | Discharge: 2020-05-20 | Disposition: A | Discharge status: Home / Self Care | Payer: Medicare (Managed Care) | Attending: Emergency Medicine | Admitting: Emergency Medicine

## 2020-05-20 ENCOUNTER — Other Ambulatory Visit: Payer: Self-pay

## 2020-05-20 DIAGNOSIS — Z7982 Long term (current) use of aspirin: Secondary | ICD-10-CM | POA: Insufficient documentation

## 2020-05-20 DIAGNOSIS — I10 Essential (primary) hypertension: Secondary | ICD-10-CM | POA: Diagnosis not present

## 2020-05-20 DIAGNOSIS — S70362A Insect bite (nonvenomous), left thigh, initial encounter: Secondary | ICD-10-CM | POA: Diagnosis present

## 2020-05-20 DIAGNOSIS — Z794 Long term (current) use of insulin: Secondary | ICD-10-CM | POA: Diagnosis not present

## 2020-05-20 DIAGNOSIS — W57XXXA Bitten or stung by nonvenomous insect and other nonvenomous arthropods, initial encounter: Secondary | ICD-10-CM | POA: Diagnosis not present

## 2020-05-20 DIAGNOSIS — Z8673 Personal history of transient ischemic attack (TIA), and cerebral infarction without residual deficits: Secondary | ICD-10-CM | POA: Insufficient documentation

## 2020-05-20 DIAGNOSIS — Z85038 Personal history of other malignant neoplasm of large intestine: Secondary | ICD-10-CM | POA: Insufficient documentation

## 2020-05-20 DIAGNOSIS — E119 Type 2 diabetes mellitus without complications: Secondary | ICD-10-CM | POA: Diagnosis not present

## 2020-05-20 NOTE — ED Provider Notes (Signed)
[facility] EMERGENCY DEPARTMENT Provider Note   CSN: [id number] Arrival date & time: 05/20/20  1453     History Chief Complaint  Patient presents with  . Insect Bite    [place] is a 64 y.o. female.  Pt presents with an insect bite to her left thigh.  Last night, she felt a sharp sting to the area and feels that some insect bit her, although she did not see the insect.  Today, she noticed a redness to the area and intense itching.  She has no pain to the area, just itching.  No systemic symptoms, no bruising, fevers, SOB, oral swelling, or other symptoms.  She just complains of itching.  She used Vicks vapor rub without improvement in symptoms.        Past Medical History:  Diagnosis Date  . Anemia   . Colon cancer ([place])   . Diabetes mellitus   . GERD (gastroesophageal reflux disease)   . Hyperlipidemia   . Stroke [facility])    2003 old  . 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 CVA, old, hemiparesis ([place]) 01/16/2019  . Type 2 diabetes mellitus without complication, with long-term current use of insulin ([place]) 01/16/2019  . Right patella fracture 01/16/2019  . Patellar fracture 01/15/2019  . Rectal bleeding 04/10/2012  . Supratherapeutic INR 04/10/2012  . Heme positive stool 04/10/2012  . Melena 11/07/2011  . Coagulopathy ([place]) 11/07/2011  . H/O colon cancer 11/07/2011  . OTHER ANAPHYLACTIC REACTION 06/20/2010  . ULCERATION OF INTESTINE 04/05/2010  . ANEMIA 04/02/2010  . SCABIES 07/06/2009  . UPPER RESPIRATORY INFECTION 07/06/2009  . PRURITUS, VAGINAL 01/25/2009  . URINARY INCONTINENCE 01/25/2009  . CANDIDIASIS, VAGINAL 11/06/2008  . EXTERNAL OTITIS 07/26/2008  . CHEST PAIN 05/05/2008  . EPISTAXIS 10/07/2007  . COLONIC POLYPS, HX OF 02/09/2007  . ANEMIA, B12 DEFICIENCY 01/19/2007  . SINUSITIS, ACUTE NOS 01/19/2007  . HYPERLIPIDEMIA 11/10/2006  . Essential hypertension  11/10/2006  . Unspecified cerebral artery occlusion with cerebral infarction 06/02/2001  . PATENT FORAMEN OVALE 04/21/2001  . DIABETES MELLITUS, TYPE II, UNCONTROLLED 09/01/1999    Past Surgical History:  Procedure Laterality Date  . CHOLECYSTECTOMY    . COLON SURGERY    . COLONOSCOPY     2012  . ESOPHAGOGASTRODUODENOSCOPY  11/07/2011   Procedure: ESOPHAGOGASTRODUODENOSCOPY (EGD);  Surgeon: Jerene Bears, MD;  Location: [facility];  Service: Gastroenterology;  Laterality: N/A;  . PARTIAL HYSTERECTOMY    . PATELLAR TENDON REPAIR N/A 01/15/2019   Procedure: PATELLA TENDON REPAIR;  Surgeon: Tania Ade, MD;  Location: [facility];  Service: Orthopedics;  Laterality: N/A;  . UPPER GASTROINTESTINAL ENDOSCOPY       OB History   No obstetric history on file.     Family History  Problem Relation Age of Onset  . Cancer Other   . Diabetes Other   . Colon cancer Mother 39  . Colon polyps Neg Hx   . Esophageal cancer Neg Hx   . Stomach cancer Neg Hx   . Rectal cancer Neg Hx     Social History   Tobacco Use  . Smoking status: Never Smoker  . Smokeless tobacco: Never Used  Vaping Use  . Vaping Use: Never used  Substance Use Topics  . Alcohol use: Yes    Comment: occasional glass of wine  . Drug use: No    Home Medications Prior  to Admission medications   Medication Sig Start Date End Date Taking? Authorizing Provider  acetaminophen (TYLENOL) 500 MG tablet Take 1 tablet (500 mg total) by mouth every 6 (six) hours as needed. 08/13/14   Domenic Moras, PA-C  aspirin EC 81 MG tablet Take 81 mg by mouth daily.    [provider]  EPINEPHrine 0.3 mg/0.3 mL IJ SOAJ injection Inject 0.3 mLs (0.3 mg total) into the muscle as needed (Tongue swelling, anaphylaxis). 08/16/14   Evelina Bucy, MD  insulin aspart (NOVOLOG) 100 UNIT/ML injection Inject 30 Units into the skin 2 (two) times daily.     [provider]  insulin detemir (LEVEMIR) 100 UNIT/ML injection Inject 15 Units  into the skin every evening.     [provider]  metoCLOPramide (REGLAN) 10 MG tablet Take as directed per prep instructions 10/06/19   Pyrtle, Lajuan Lines, MD  [facility] VERIO test strip  09/23/19   [provider]  sulfamethoxazole-trimethoprim (BACTRIM DS) 800-160 MG tablet Take 1 tablet by mouth 2 (two) times daily. 11/08/19   [provider]  tobramycin (TOBREX) 0.3 % ophthalmic solution Place 1 drop into the right eye every 4 (four) hours. 01/23/20   Raylene Everts, MD    Allergies    Glipizide, Ibuprofen, Iohexol, Rosiglitazone-metformin, Hydrocodone-acetaminophen, Morphine, and Pantoprazole  Review of Systems   Review of Systems  Constitutional: Negative for fever.  Gastrointestinal: Negative for nausea and vomiting.  Musculoskeletal: Negative for arthralgias, back pain, joint swelling and neck pain.  Skin: Positive for wound.  Neurological: Negative for weakness, numbness and headaches.    Physical Exam Updated Vital Signs BP 129/66 (BP Location: Left Arm)   Pulse 79   Temp 98.2 F (36.8 C) (Oral)   Resp 18   Ht 5\' 3"  (1.6 m)   Wt 77.1 kg   SpO2 97%   BMI 30.11 kg/m   Physical Exam Constitutional:      Appearance: She is well-developed and well-nourished.  HENT:     Head: Normocephalic and atraumatic.  Cardiovascular:     Rate and Rhythm: Normal rate.  Pulmonary:     Effort: Pulmonary effort is normal.  Musculoskeletal:        General: Edema present. No tenderness.     Cervical back: Normal range of motion and neck supple.  Skin:    General: Skin is warm and dry.     Comments: 4cm area of purpura with central scab to left proximal thigh.  No warmth.  Nonblanching.  No tenderness.  No induration or fluctuance.  No drainage.  Neurological:     Mental Status: She is alert and oriented to person, place, and time.  Psychiatric:        Mood and Affect: Mood and affect normal.       ED Results / Procedures / Treatments   Labs (all labs  ordered are listed, but only abnormal results are displayed) Labs Reviewed - No data to display  EKG None  Radiology No results found.  Procedures Procedures (including critical care time)  Medications Ordered in ED Medications - No data to display  ED Course  I have reviewed the triage vital signs and the nursing notes.  Pertinent labs & imaging results that were available during my care of the patient were reviewed by me and considered in my medical decision making (see chart for details).    MDM Rules/Calculators/A&P  Pt with localized reaction to spider bite.  No suggestions of infection.  No systemic symptoms.  Symptomatic care instructions given, return precautions given.  Final Clinical Impression(s) / ED Diagnoses Final diagnoses:  Insect bite of left thigh, initial encounter    Rx / DC Orders ED Discharge Orders    None       Malvin Johns, MD 05/20/20 [phone]

## 2020-05-20 NOTE — ED Triage Notes (Signed)
Pt reports bit by some kind of bug to her left thigh x 1 day. Pt reports redness, swelling and itching.

## 2020-05-20 NOTE — Discharge Instructions (Addendum)
Use benadryl cream and hydrocortisone cream to the area.  Return here is there are any worsening symptoms, including increased redness, pain, drainage, fevers, or other worsening symptoms.

## 2020-06-08 ENCOUNTER — Ambulatory Visit
Admission: RE | Admit: 2020-06-08 | Discharge: 2020-06-08 | Disposition: A | Discharge status: Home / Self Care | Payer: Medicare (Managed Care) | Source: Ambulatory Visit | Attending: Internal Medicine | Admitting: Internal Medicine

## 2020-06-08 ENCOUNTER — Other Ambulatory Visit: Payer: Self-pay

## 2020-06-08 DIAGNOSIS — Z1231 Encounter for screening mammogram for malignant neoplasm of breast: Secondary | ICD-10-CM

## 2020-09-18 IMAGING — CT CT HEAD WITHOUT CONTRAST
1 series · 16 of 30 positions shown, 20 images · non-contrast
Comparison: Head CT 08/17/2010

CLINICAL DATA: Headache.  Posttraumatic.  Slipped and hit head.

EXAM:
CT HEAD WITHOUT CONTRAST
TECHNIQUE: Contiguous axial images were obtained from the base of the skull
through the vertex without intravenous contrast.

[Series 2: head wo · axial · 0.47mm/px · z∈[-127,+8]mm · 16 of 31 slices shown, 20 images]
[im 2/31  brain]
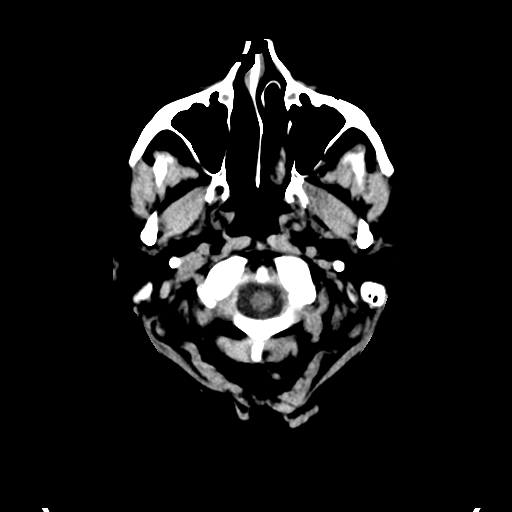
[im 2/31  bone]
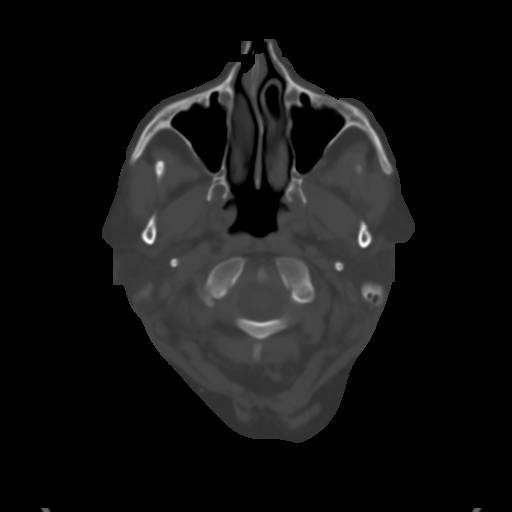
[im 4/31  brain]
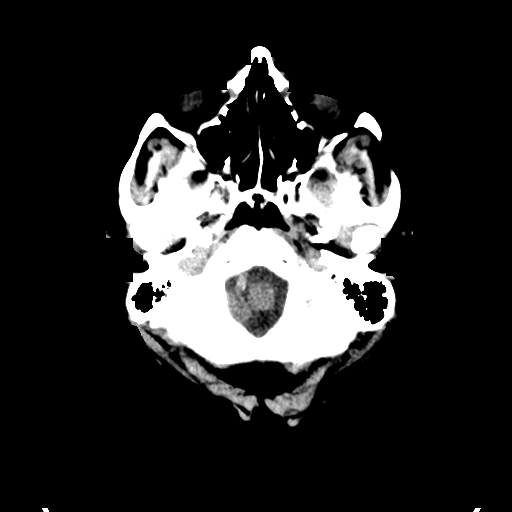
[im 6/31  brain]
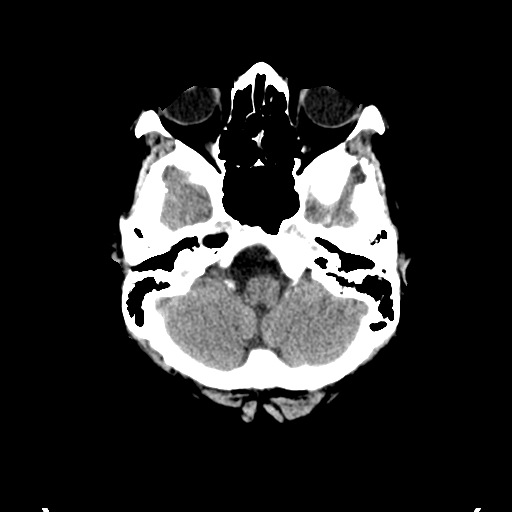
[im 8/31  brain]
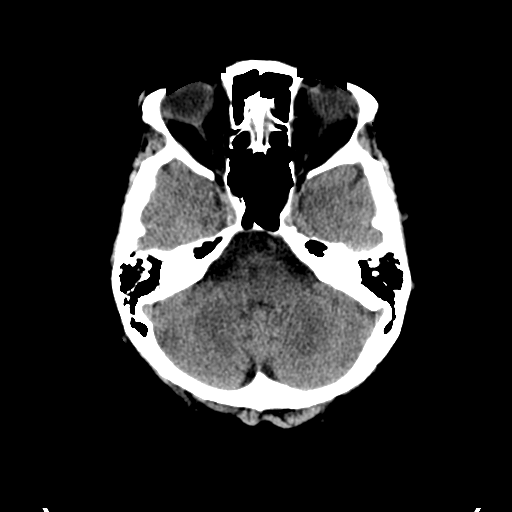
[im 9/31  brain]
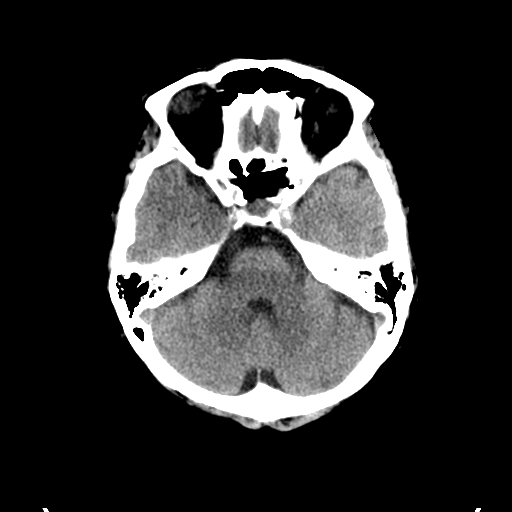
[im 9/31  bone]
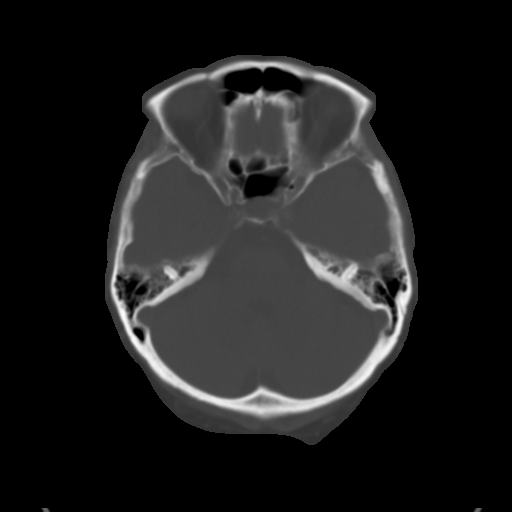
[im 11/31  brain]
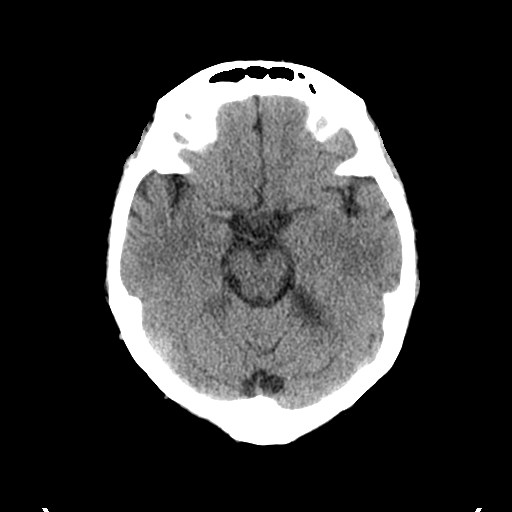
[im 13/31  brain]
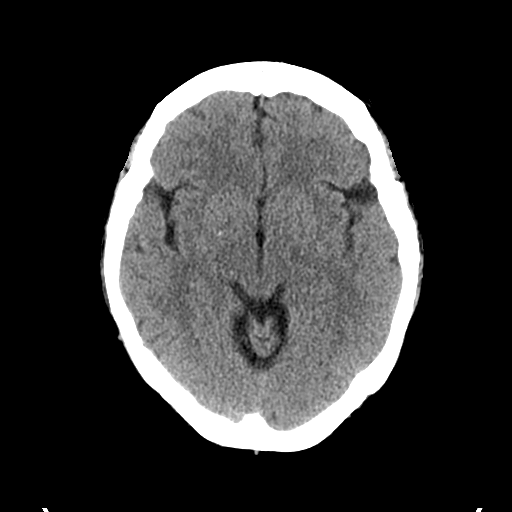
[im 15/31  brain]
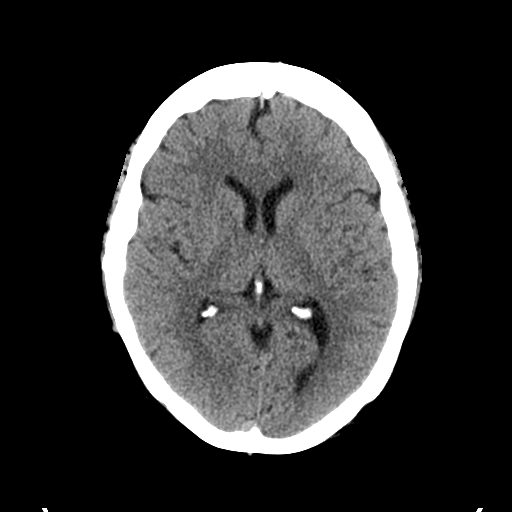
[im 16/31  brain]
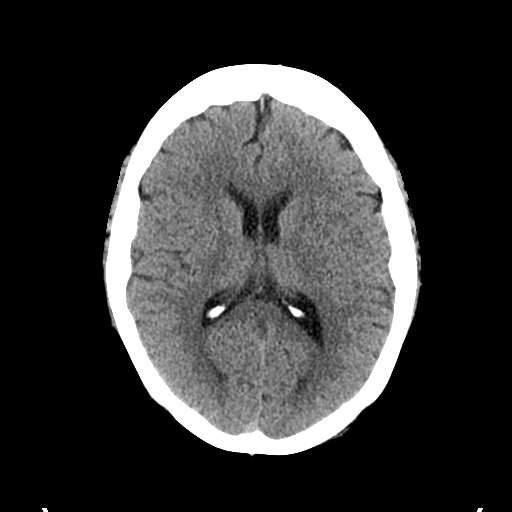
[im 16/31  bone]
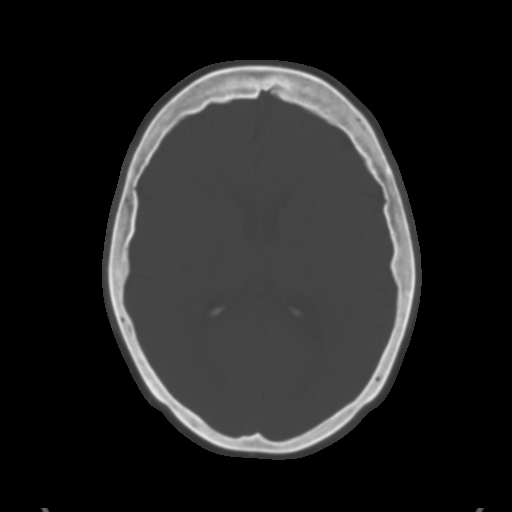
[im 18/31  brain]
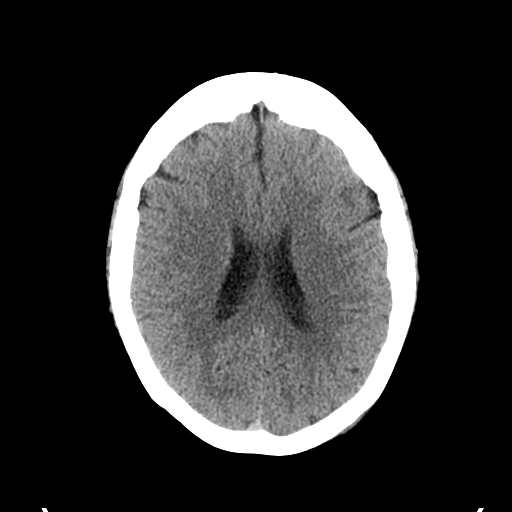
[im 20/31  brain]
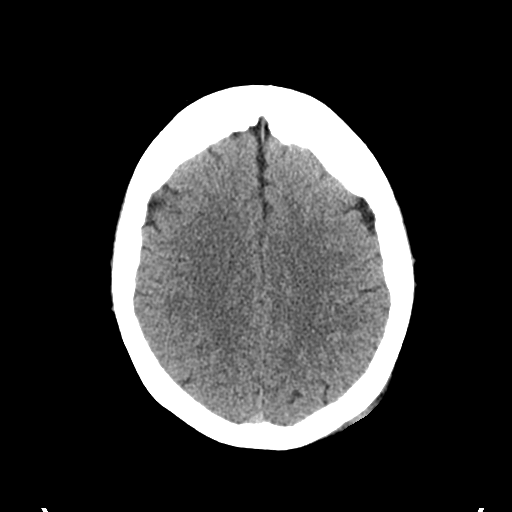
[im 22/31  brain]
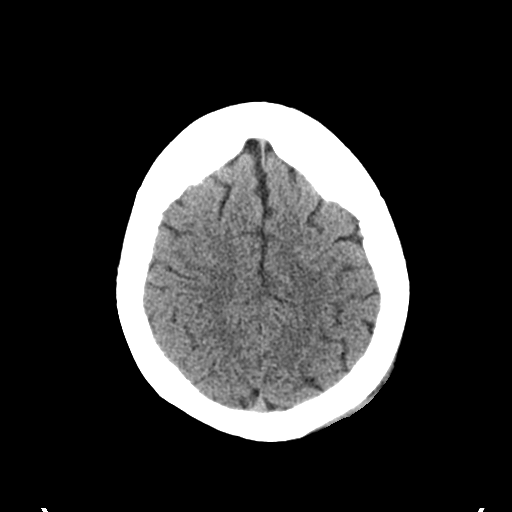
[im 23/31  brain]
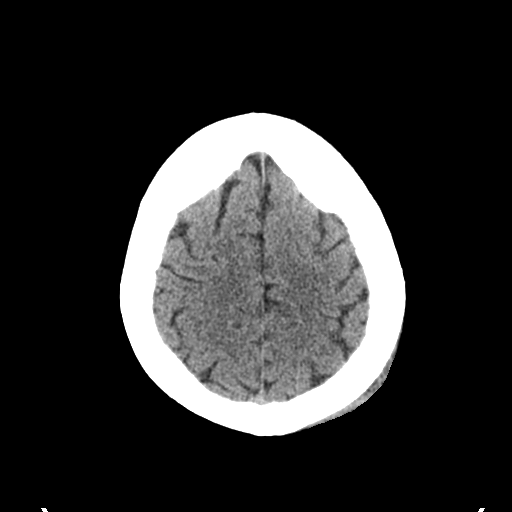
[im 23/31  bone]
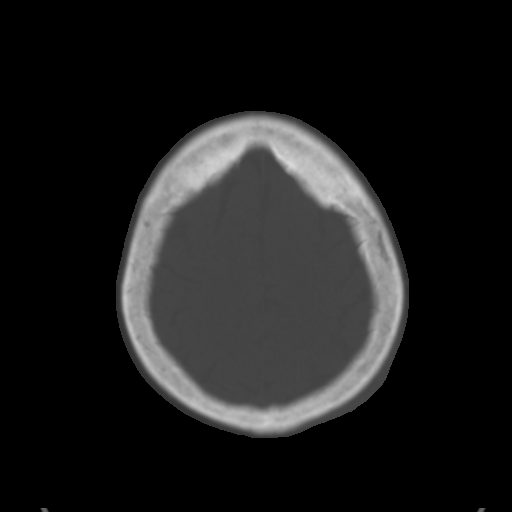
[im 25/31  brain]
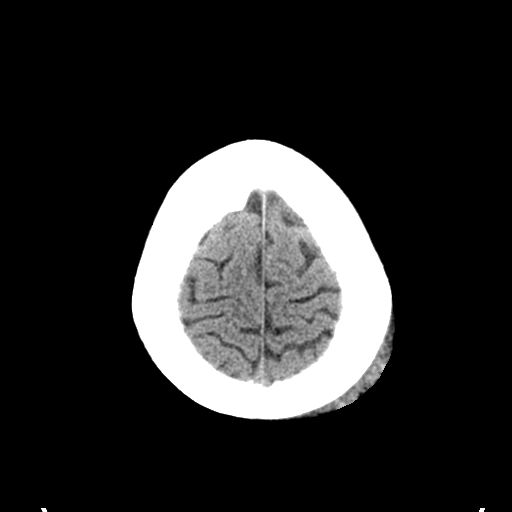
[im 27/31  brain]
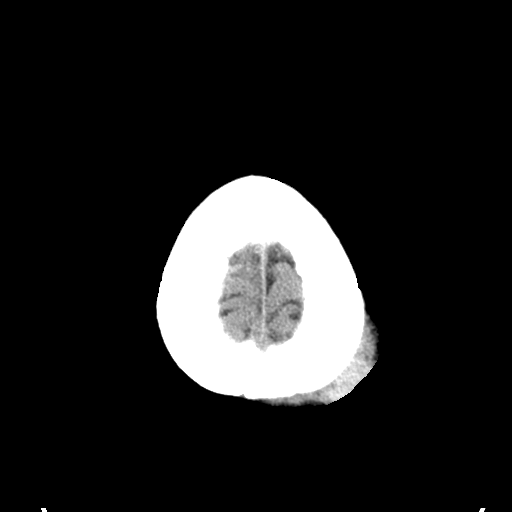
[im 29/31  brain]
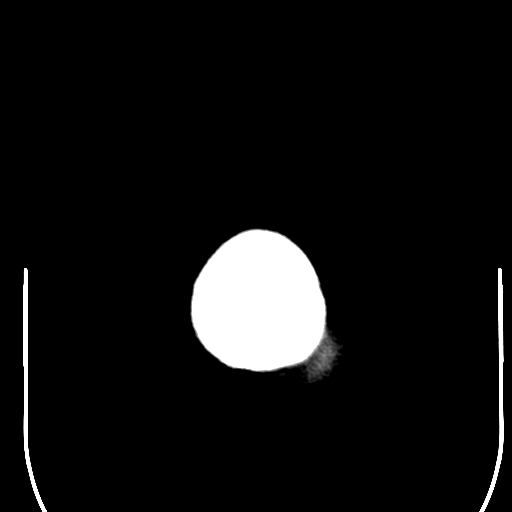

[16 of 30 positions shown; findings below may reference images not displayed]

FINDINGS: Brain: No evidence for acute hemorrhage, mass lesion, midline shift,
hydrocephalus or large infarct. Small amount of mineralization in
the basal ganglia region.

Vascular: No hyperdense vessel or unexpected calcification.

Skull: Scalp hematoma along the left posterior parietal region near
the vertex. Negative for a calvarial fracture.

Sinuses/Orbits: Visualized paranasal sinuses are clear.

Other: None.
IMPRESSION: 1. No acute intracranial abnormality.
2. Left posterior scalp hematoma.  No underlying fracture.

## 2020-09-18 IMAGING — CR RIGHT KNEE - COMPLETE 4+ VIEW
4 series · 4 of 4 positions shown · non-contrast
Comparison: None.

CLINICAL DATA: Fell in bathroom. Right knee pain and swelling.
Initial encounter.

EXAM:
RIGHT KNEE - COMPLETE 4+ VIEW

[x knee lat right]
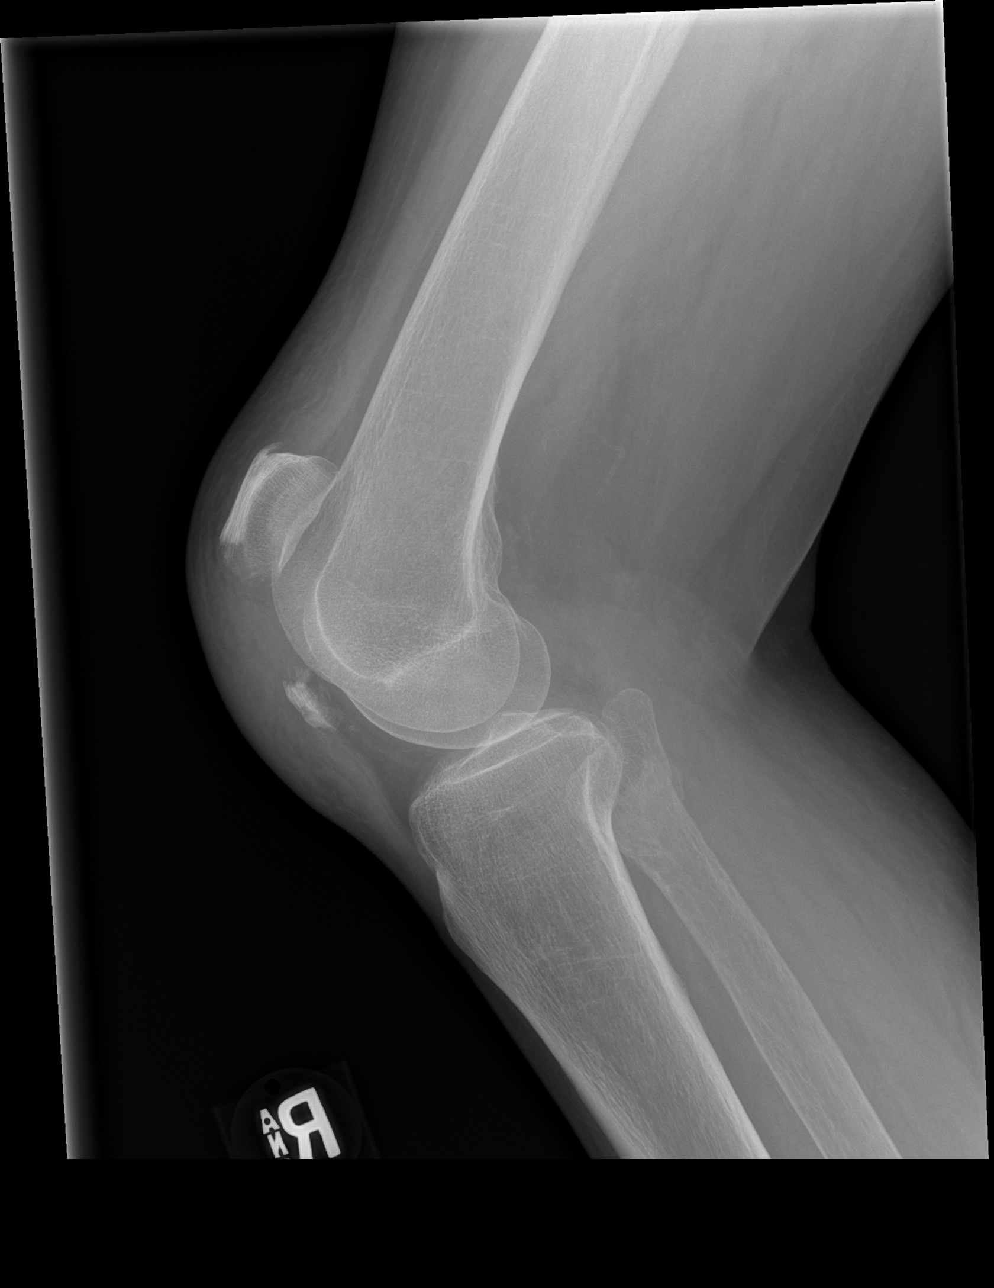

[x knee ap right (1 of 3)]
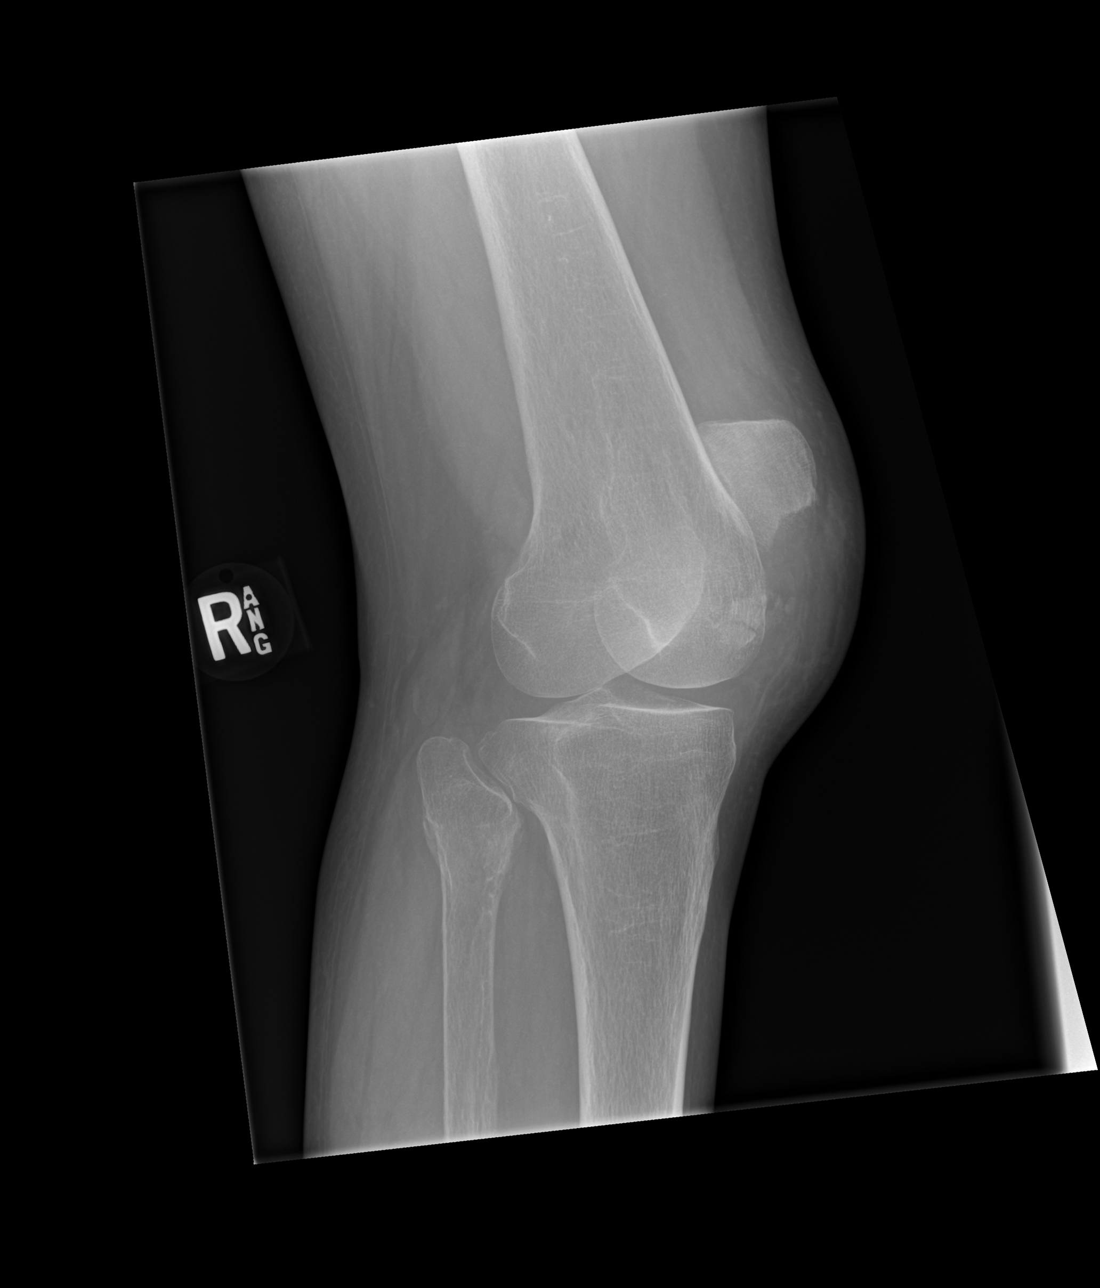

[x knee ap right (2 of 3)]
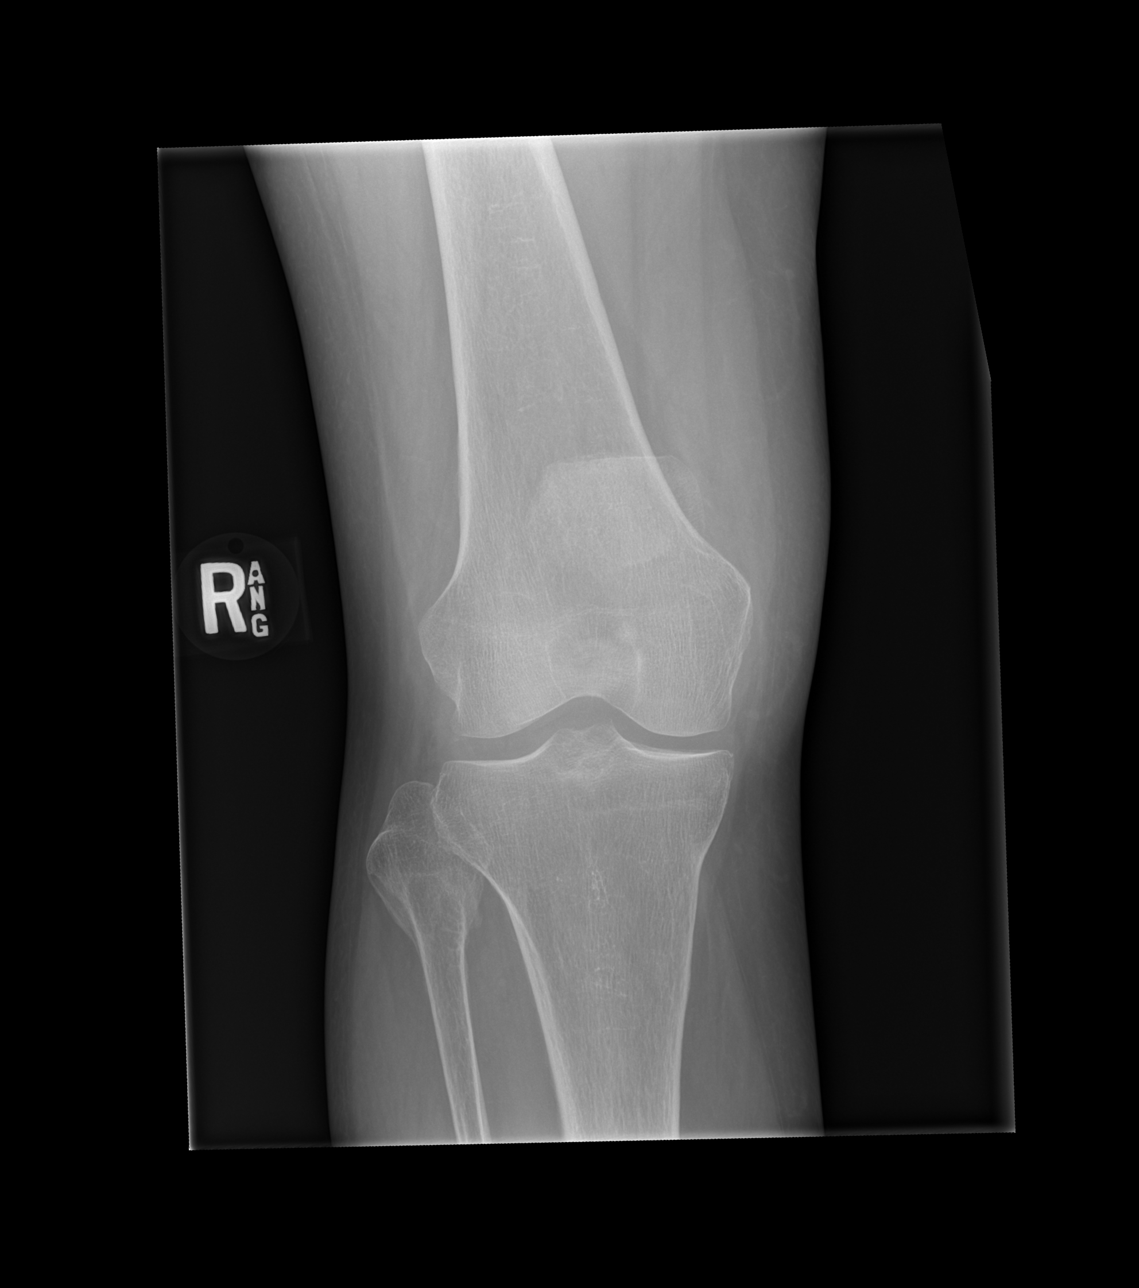

[x knee ap right (3 of 3)]
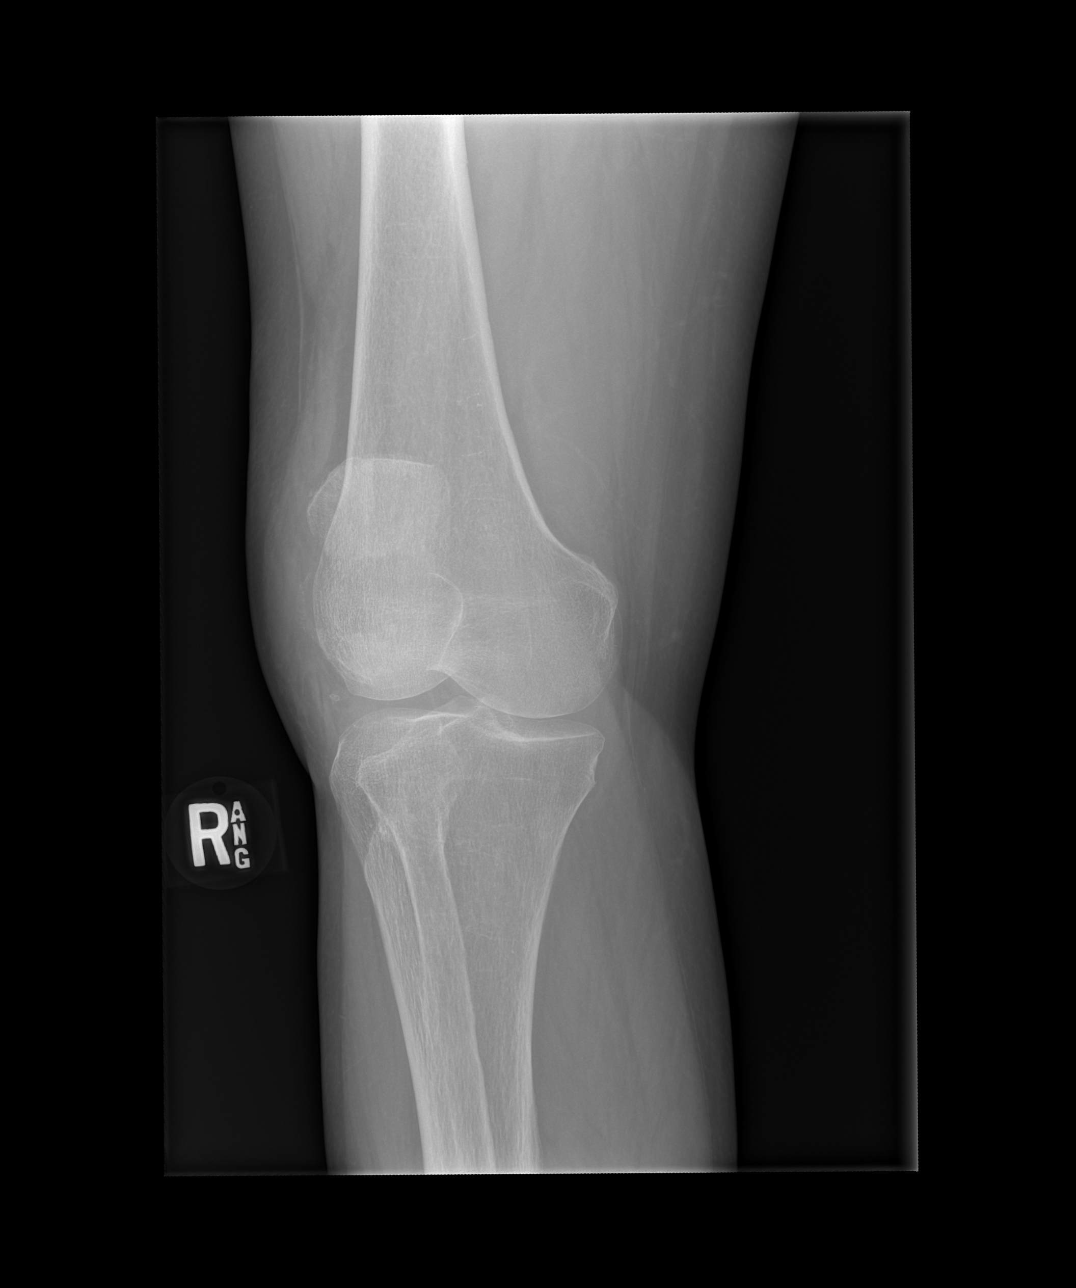

[4 of 4 positions shown; findings below may reference images not displayed]

FINDINGS: A displaced fracture through the inferior aspect of the patella is
seen. Marked prepatellar soft tissue swelling is seen but there is
no evidence of joint effusion. Nondisplaced fracture of proximal
fibula is also noted.
IMPRESSION: 1. Displaced fracture through inferior patella.
2. Nondisplaced proximal fibular fracture.

## 2021-02-08 ENCOUNTER — Ambulatory Visit: Payer: Medicare (Managed Care) | Admitting: Surgical

## 2021-02-15 ENCOUNTER — Ambulatory Visit: Payer: Medicare (Managed Care) | Admitting: Orthopaedic Surgery

## 2021-02-19 ENCOUNTER — Ambulatory Visit: Payer: Medicare (Managed Care) | Admitting: Orthopaedic Surgery

## 2022-01-13 DIAGNOSIS — R051 Acute cough: Secondary | ICD-10-CM | POA: Diagnosis not present

## 2022-01-13 DIAGNOSIS — R5383 Other fatigue: Secondary | ICD-10-CM | POA: Diagnosis not present

## 2022-01-13 DIAGNOSIS — N390 Urinary tract infection, site not specified: Secondary | ICD-10-CM | POA: Diagnosis not present

## 2022-01-13 DIAGNOSIS — Z1152 Encounter for screening for COVID-19: Secondary | ICD-10-CM | POA: Diagnosis not present

## 2022-01-13 DIAGNOSIS — R0981 Nasal congestion: Secondary | ICD-10-CM | POA: Diagnosis not present

## 2022-01-13 DIAGNOSIS — J029 Acute pharyngitis, unspecified: Secondary | ICD-10-CM | POA: Diagnosis not present

## 2022-01-23 DIAGNOSIS — R7989 Other specified abnormal findings of blood chemistry: Secondary | ICD-10-CM | POA: Diagnosis not present

## 2022-01-23 DIAGNOSIS — E1122 Type 2 diabetes mellitus with diabetic chronic kidney disease: Secondary | ICD-10-CM | POA: Diagnosis not present

## 2022-01-23 DIAGNOSIS — E785 Hyperlipidemia, unspecified: Secondary | ICD-10-CM | POA: Diagnosis not present

## 2022-01-23 DIAGNOSIS — I10 Essential (primary) hypertension: Secondary | ICD-10-CM | POA: Diagnosis not present

## 2022-01-24 DIAGNOSIS — R7989 Other specified abnormal findings of blood chemistry: Secondary | ICD-10-CM | POA: Diagnosis not present

## 2022-01-24 DIAGNOSIS — I10 Essential (primary) hypertension: Secondary | ICD-10-CM | POA: Diagnosis not present

## 2022-01-24 DIAGNOSIS — E785 Hyperlipidemia, unspecified: Secondary | ICD-10-CM | POA: Diagnosis not present

## 2022-01-24 DIAGNOSIS — E1122 Type 2 diabetes mellitus with diabetic chronic kidney disease: Secondary | ICD-10-CM | POA: Diagnosis not present

## 2022-01-28 DIAGNOSIS — Z85038 Personal history of other malignant neoplasm of large intestine: Secondary | ICD-10-CM | POA: Diagnosis not present

## 2022-01-28 DIAGNOSIS — I69359 Hemiplegia and hemiparesis following cerebral infarction affecting unspecified side: Secondary | ICD-10-CM | POA: Diagnosis not present

## 2022-01-28 DIAGNOSIS — E663 Overweight: Secondary | ICD-10-CM | POA: Diagnosis not present

## 2022-01-28 DIAGNOSIS — Z1331 Encounter for screening for depression: Secondary | ICD-10-CM | POA: Diagnosis not present

## 2022-01-28 DIAGNOSIS — Z0000 Encounter for general adult medical examination without abnormal findings: Secondary | ICD-10-CM | POA: Diagnosis not present

## 2022-01-28 DIAGNOSIS — Z794 Long term (current) use of insulin: Secondary | ICD-10-CM | POA: Diagnosis not present

## 2022-01-28 DIAGNOSIS — B3731 Acute candidiasis of vulva and vagina: Secondary | ICD-10-CM | POA: Diagnosis not present

## 2022-01-28 DIAGNOSIS — E1122 Type 2 diabetes mellitus with diabetic chronic kidney disease: Secondary | ICD-10-CM | POA: Diagnosis not present

## 2022-01-28 DIAGNOSIS — Z6827 Body mass index (BMI) 27.0-27.9, adult: Secondary | ICD-10-CM | POA: Diagnosis not present

## 2022-01-28 DIAGNOSIS — E785 Hyperlipidemia, unspecified: Secondary | ICD-10-CM | POA: Diagnosis not present

## 2022-01-28 DIAGNOSIS — I10 Essential (primary) hypertension: Secondary | ICD-10-CM | POA: Diagnosis not present

## 2022-01-28 DIAGNOSIS — E1140 Type 2 diabetes mellitus with diabetic neuropathy, unspecified: Secondary | ICD-10-CM | POA: Diagnosis not present

## 2022-01-28 DIAGNOSIS — Z1339 Encounter for screening examination for other mental health and behavioral disorders: Secondary | ICD-10-CM | POA: Diagnosis not present

## 2022-02-07 DIAGNOSIS — Z780 Asymptomatic menopausal state: Secondary | ICD-10-CM | POA: Diagnosis not present

## 2022-02-07 DIAGNOSIS — M8588 Other specified disorders of bone density and structure, other site: Secondary | ICD-10-CM | POA: Diagnosis not present

## 2022-02-07 DIAGNOSIS — M810 Age-related osteoporosis without current pathological fracture: Secondary | ICD-10-CM | POA: Diagnosis not present

## 2022-03-17 ENCOUNTER — Other Ambulatory Visit: Payer: Self-pay

## 2022-03-17 ENCOUNTER — Emergency Department (HOSPITAL_BASED_OUTPATIENT_CLINIC_OR_DEPARTMENT_OTHER)
Admission: EM | Admit: 2022-03-17 | Discharge: 2022-03-17 | Disposition: A | Discharge status: Home / Self Care | Payer: PPO | Attending: Emergency Medicine | Admitting: Emergency Medicine

## 2022-03-17 ENCOUNTER — Emergency Department (HOSPITAL_BASED_OUTPATIENT_CLINIC_OR_DEPARTMENT_OTHER): Payer: PPO

## 2022-03-17 ENCOUNTER — Encounter (HOSPITAL_BASED_OUTPATIENT_CLINIC_OR_DEPARTMENT_OTHER): Payer: Self-pay | Admitting: Emergency Medicine

## 2022-03-17 DIAGNOSIS — R300 Dysuria: Secondary | ICD-10-CM | POA: Diagnosis not present

## 2022-03-17 DIAGNOSIS — W19XXXA Unspecified fall, initial encounter: Secondary | ICD-10-CM

## 2022-03-17 DIAGNOSIS — Z7982 Long term (current) use of aspirin: Secondary | ICD-10-CM | POA: Diagnosis not present

## 2022-03-17 DIAGNOSIS — Z9049 Acquired absence of other specified parts of digestive tract: Secondary | ICD-10-CM | POA: Diagnosis not present

## 2022-03-17 DIAGNOSIS — I700 Atherosclerosis of aorta: Secondary | ICD-10-CM | POA: Diagnosis not present

## 2022-03-17 DIAGNOSIS — Z794 Long term (current) use of insulin: Secondary | ICD-10-CM | POA: Diagnosis not present

## 2022-03-17 DIAGNOSIS — S29001A Unspecified injury of muscle and tendon of front wall of thorax, initial encounter: Secondary | ICD-10-CM | POA: Diagnosis present

## 2022-03-17 DIAGNOSIS — R81 Glycosuria: Secondary | ICD-10-CM

## 2022-03-17 DIAGNOSIS — J984 Other disorders of lung: Secondary | ICD-10-CM | POA: Diagnosis not present

## 2022-03-17 DIAGNOSIS — E119 Type 2 diabetes mellitus without complications: Secondary | ICD-10-CM | POA: Insufficient documentation

## 2022-03-17 DIAGNOSIS — S20211A Contusion of right front wall of thorax, initial encounter: Secondary | ICD-10-CM | POA: Insufficient documentation

## 2022-03-17 DIAGNOSIS — W1839XA Other fall on same level, initial encounter: Secondary | ICD-10-CM | POA: Insufficient documentation

## 2022-03-17 LAB — URINALYSIS, ROUTINE W REFLEX MICROSCOPIC
Bilirubin Urine: NEGATIVE
Glucose, UA: >=500 mg/dL — AB
Ketones, ur: NEGATIVE mg/dL
Nitrite: NEGATIVE
Protein, ur: NEGATIVE mg/dL
Specific Gravity, Urine: 1.025 (ref 1.005–1.030)
pH: 6 (ref 5.0–8.0)

## 2022-03-17 LAB — URINALYSIS, MICROSCOPIC (REFLEX)

## 2022-03-17 MED ORDER — NITROFURANTOIN MONOHYD MACRO 100 MG PO CAPS: 100.0000 mg | ORAL_CAPSULE | Freq: Two times a day (BID) | ORAL | 0 refills | Status: DC

## 2022-03-17 MED ORDER — ACETAMINOPHEN 500 MG PO TABS
1000.0000 mg | ORAL_TABLET | Freq: Once | ORAL | Status: AC
  Administered 2022-03-17: 1000 mg via ORAL
  Filled 2022-03-17: qty 2

## 2022-03-17 NOTE — ED Triage Notes (Addendum)
Pt arrives pov, to triage in wheelchair, c/o mechanical fall last week. C/o RT side Chest pain, and when bending forward, reaching with RT arm, feels shob. Pt also adds dysuria x 4 days

## 2022-03-17 NOTE — Discharge Instructions (Signed)
Please take the Macrobid which is an antibiotic for the entire course.  Please follow-up with your primary care doctor.

## 2022-03-17 NOTE — ED Provider Notes (Cosign Needed Addendum)
[facility] EMERGENCY DEPARTMENT Provider Note   CSN: [id number] Arrival date & time: 03/17/22  1613     History  Chief Complaint  Patient presents with   [name] is a 66 y.o. female.   Fall  Patient is a 66 female with past medical history significant for prior stroke with left-sided weakness, chronic right knee pain, diabetes  Patient states that 6 days ago she tripped and fell onto her right side on a Ortho surface.  She did not strike her head or lose consciousness she denies any neck or back pain.  She was able to get up afterwards but states that she has had some right-sided chest wall pain since that time.  She denies any hemoptysis, cough, lightness or dizziness.  She states it is uncomfortable and she takes a deep breath sometimes which makes her feel somewhat short of breath but states that she does not have any difficulty breathing.  She states it also hurts in her chest when she bends over or reaches hours of her right arm.  She denies any extremity pain, abdominal pain, lightness or dizziness.  She also endorses some occasional dysuria that she states occurs at most once per day over the past 3 days.  She has not had any hematuria, fevers, abdominal pain, urinary frequency or urgency.     Home Medications Prior to Admission medications   Medication Sig Start Date End Date Taking? Authorizing Provider  nitrofurantoin, macrocrystal-monohydrate, (MACROBID) 100 MG capsule Take 1 capsule (100 mg total) by mouth 2 (two) times daily. 03/17/22  Yes Kweku Stankey, Ova [place] S, PA  acetaminophen (TYLENOL) 500 MG tablet Take 1 tablet (500 mg total) by mouth every 6 (six) hours as needed. 08/13/14   Domenic Moras, PA-C  aspirin EC 81 MG tablet Take 81 mg by mouth daily.    [provider]  EPINEPHrine 0.3 mg/0.3 mL IJ SOAJ injection Inject 0.3 mLs (0.3 mg total) into the muscle as needed (Tongue swelling, anaphylaxis). 08/16/14   Evelina Bucy, MD   insulin aspart (NOVOLOG) 100 UNIT/ML injection Inject 30 Units into the skin 2 (two) times daily.     [provider]  insulin detemir (LEVEMIR) 100 UNIT/ML injection Inject 15 Units into the skin every evening.     [provider]  metoCLOPramide (REGLAN) 10 MG tablet Take as directed per prep instructions 10/06/19   Pyrtle, Lajuan Lines, MD  [facility] VERIO test strip  09/23/19   [provider]  sulfamethoxazole-trimethoprim (BACTRIM DS) 800-160 MG tablet Take 1 tablet by mouth 2 (two) times daily. 11/08/19   [provider]  tobramycin (TOBREX) 0.3 % ophthalmic solution Place 1 drop into the right eye every 4 (four) hours. 01/23/20   Raylene Everts, MD      Allergies    Glipizide, Ibuprofen, Iohexol, Rosiglitazone-metformin, Hydrocodone-acetaminophen, Morphine, and Pantoprazole    Review of Systems   Review of Systems  Physical Exam Updated Vital Signs BP 133/71   Pulse 76   Temp 98.1 F (36.7 C)   Resp 18   Ht '5\' 3"'$  (1.6 m)   Wt 72.6 kg   SpO2 96%   BMI 28.34 kg/m  Physical Exam Vitals and nursing note reviewed.  Constitutional:      General: She is not in acute distress.    Appearance: She is obese.  HENT:     Head: Normocephalic and atraumatic.     Nose: Nose normal.  Mouth/Throat:     Mouth: Mucous membranes are moist.  Eyes:     General: No scleral icterus. Cardiovascular:     Rate and Rhythm: Normal rate and regular rhythm.     Pulses: Normal pulses.     Heart sounds: Normal heart sounds.  Pulmonary:     Effort: Pulmonary effort is normal. No respiratory distress.     Breath sounds: No wheezing.  Chest:     Chest wall: Tenderness present.  Abdominal:     Palpations: Abdomen is soft.     Tenderness: There is no abdominal tenderness. There is no guarding or rebound.  Musculoskeletal:     Cervical back: Normal range of motion.     Right lower leg: No edema.     Left lower leg: No edema.     Comments: Some right chest wall  tenderness over ribs 7 through 9 approximately.   No bony tenderness over joints or long bones of the upper and lower extremities.    No neck or back midline tenderness, step-off, deformity, or bruising.   Full range of motion of upper and lower extremity joints shown after palpation was conducted; with 5/5 symmetrical strength in upper and lower extremities. No chest wall tenderness, no facial or cranial tenderness.   Patient has intact sensation grossly in lower and upper extremities. Radial pulses palpated BL.    Skin:    General: Skin is warm and dry.     Capillary Refill: Capillary refill takes less than 2 seconds.  Neurological:     Mental Status: She is alert. Mental status is at baseline.  Psychiatric:        Mood and Affect: Mood normal.        Behavior: Behavior normal.     ED Results / Procedures / Treatments   Labs (all labs ordered are listed, but only abnormal results are displayed) Labs Reviewed  URINALYSIS, ROUTINE W REFLEX MICROSCOPIC - Abnormal; Notable for the following components:      Result Value   APPearance CLOUDY (*)    Glucose, UA >=500 (*)    Hgb urine dipstick TRACE (*)    Leukocytes,Ua TRACE (*)    All other components within normal limits  URINALYSIS, MICROSCOPIC (REFLEX) - Abnormal; Notable for the following components:   Bacteria, UA MANY (*)    All other components within normal limits  URINE CULTURE    EKG None  Radiology DG Ribs Unilateral W/Chest Right  Result Date: 03/17/2022 CLINICAL DATA:  Fall onto right-sided chest. 7th through ninth lateral right ribs tender to palpation. EXAM: RIGHT RIBS AND CHEST - 3+ VIEW COMPARISON:  AP chest 01/15/2019 FINDINGS: Left chest wall porta-catheter tip again overlies the left basal vein near the confluence with the superior vena cava. Cardiac silhouette and mediastinal contours within normal limits with mild calcification again seen within the aortic arch. Mild linear scarring within the  superolateral right upper lung. The left lung remains clear. No pleural effusion or pneumothorax. A BB marker overlies the lateral right tenth rib. No acute fracture is visualized within the right ribs. Right upper quadrant cholecystectomy clips. IMPRESSION: 1. No acute right rib fracture is visualized.  No pneumothorax. 2. Mild linear scarring within the superolateral right upper lung, unchanged from prior. Electronically Signed   By: Yvonne Kendall M.D.   On: 03/17/2022 17:22    Procedures Procedures    Medications Ordered in ED Medications  acetaminophen (TYLENOL) tablet 1,000 mg (1,000 mg Oral Given 03/17/22 1656)  ED Course/ Medical Decision Making/ A&P Clinical Course as of 03/17/22 1735  Mon Mar 17, 2022  1725 CXR viewed by me. NAD IMPRESSION: 1. No acute right rib fracture is visualized.  No pneumothorax. 2. Mild linear scarring within the superolateral right upper lung, unchanged from prior.   [WF]    Clinical Course User Index [WF] Tedd Sias, PA                           Medical Decision Making Amount and/or Complexity of Data Reviewed Labs: ordered. Radiology: ordered.  Risk OTC drugs. Prescription drug management.   Patient is a 34 female with past medical history significant for prior stroke with left-sided weakness, chronic right knee pain, diabetes  Patient states that 6 days ago she tripped and fell onto her right side on a Ortho surface.  She did not strike her head or lose consciousness she denies any neck or back pain.  She was able to get up afterwards but states that she has had some right-sided chest wall pain since that time.  She denies any hemoptysis, cough, lightness or dizziness.  She states it is uncomfortable and she takes a deep breath sometimes which makes her feel somewhat short of breath but states that she does not have any difficulty breathing.  She states it also hurts in her chest when she bends over or reaches hours of her right  arm.  She denies any extremity pain, abdominal pain, lightness or dizziness.  She also endorses some occasional dysuria that she states occurs at most once per day over the past 3 days.  She has not had any hematuria, fevers, abdominal pain, urinary frequency or urgency.   Physical exam with some right chest wall tenderness  No abdominal tenderness  I personally ordered and reviewed chest x-ray reviewed right ribs.  I do not appreciate any fractures or pneumothorax.  Urine with many bacteria, some WBCs, trace leukocytes, trace blood.  Given her urinary discomfort and intermediate UA we will treat empirically with ABX and obtain urine culture.  We will discharge home with Macrobid.  Patient given 1 dose of Tylenol with some improvement in her symptoms of right chest wall pain.  Still tender however.  No pneumothorax.  We will discharge home with follow-up with PCP.  Return precautions discussed given the glucose.  She will certainly need to have a follow-up with her PCP to continue to monitor her blood sugars.  Return precautions discussed.  Patient agreeable plan.  Vital signs within normal limits, patient well-appearing at time of discharge   Final Clinical Impression(s) / ED Diagnoses Final diagnoses:  Rib contusion, right, initial encounter  Fall, initial encounter    Rx / DC Orders ED Discharge Orders          Ordered    nitrofurantoin, macrocrystal-monohydrate, (MACROBID) 100 MG capsule  2 times daily        03/17/22 1735              Tedd Sias, PA 03/17/22 1729    Tedd Sias, [place] 03/17/22 1735    Dorie Rank, MD 03/19/22 [phone]

## 2022-03-19 LAB — URINE CULTURE: Culture: >=100000 — AB

## 2022-03-20 ENCOUNTER — Telehealth (HOSPITAL_BASED_OUTPATIENT_CLINIC_OR_DEPARTMENT_OTHER): Payer: Self-pay | Admitting: *Deleted

## 2022-03-20 NOTE — Telephone Encounter (Signed)
Post ED Visit - Positive Culture Follow-up  Culture report reviewed by antimicrobial stewardship pharmacist: [place] Team '[]'$  Elenor Quinones, Pharm.D. '[]'$  Heide Guile, Pharm.D., BCPS AQ-ID '[]'$  Parks Neptune, Pharm.D., BCPS '[]'$  Alycia Rossetti, Pharm.D., BCPS '[]'$  [place], Pharm.D., BCPS, AAHIVP '[]'$  Legrand Como, Pharm.D., BCPS, AAHIVP '[]'$  Salome Arnt, PharmD, BCPS '[]'$  Johnnette Gourd, PharmD, BCPS '[]'$  Hughes Better, PharmD, BCPS '[]'$  Leeroy Cha, PharmD '[]'$  Laqueta Linden, PharmD, BCPS '[]'$  Albertina Parr, PharmD  [place] [facility] Team '[]'$  Leodis Sias, PharmD '[]'$  Lindell Spar, PharmD '[]'$  Royetta Asal, PharmD '[]'$  Graylin Shiver, Rph '[]'$  Rema Fendt) Glennon Mac, PharmD '[]'$  Arlyn Dunning, PharmD '[]'$  Netta Cedars, PharmD '[]'$  Dia Sitter, PharmD '[]'$  Leone Haven, PharmD '[]'$  Gretta Arab, PharmD '[]'$  Theodis Shove, PharmD '[]'$  Peggyann Juba, PharmD '[]'$  Reuel Boom, PharmD   Positive urine culture Treated with Nitrofurantoin Nonohyd Macro, organism sensitive to the same and no further patient follow-up is required at this time.  Titus Dubin, PharmD  Harlon [place] 03/20/2022, 11:27 AM

## 2022-04-28 DIAGNOSIS — R0981 Nasal congestion: Secondary | ICD-10-CM | POA: Diagnosis not present

## 2022-04-28 DIAGNOSIS — J0100 Acute maxillary sinusitis, unspecified: Secondary | ICD-10-CM | POA: Diagnosis not present

## 2022-04-28 DIAGNOSIS — R051 Acute cough: Secondary | ICD-10-CM | POA: Diagnosis not present

## 2022-04-28 DIAGNOSIS — I10 Essential (primary) hypertension: Secondary | ICD-10-CM | POA: Diagnosis not present

## 2022-04-28 DIAGNOSIS — R519 Headache, unspecified: Secondary | ICD-10-CM | POA: Diagnosis not present

## 2022-04-28 DIAGNOSIS — Z1152 Encounter for screening for COVID-19: Secondary | ICD-10-CM | POA: Diagnosis not present

## 2022-04-30 DIAGNOSIS — E1122 Type 2 diabetes mellitus with diabetic chronic kidney disease: Secondary | ICD-10-CM | POA: Diagnosis not present

## 2022-04-30 DIAGNOSIS — I10 Essential (primary) hypertension: Secondary | ICD-10-CM | POA: Diagnosis not present

## 2022-04-30 DIAGNOSIS — Z23 Encounter for immunization: Secondary | ICD-10-CM | POA: Diagnosis not present

## 2022-04-30 DIAGNOSIS — Z794 Long term (current) use of insulin: Secondary | ICD-10-CM | POA: Diagnosis not present

## 2022-04-30 DIAGNOSIS — E785 Hyperlipidemia, unspecified: Secondary | ICD-10-CM | POA: Diagnosis not present

## 2022-05-02 ENCOUNTER — Other Ambulatory Visit (HOSPITAL_COMMUNITY): Payer: Self-pay

## 2022-05-02 MED ORDER — OZEMPIC (1 MG/DOSE) 4 MG/3ML ~~LOC~~ SOPN
1.0000 mg | PEN_INJECTOR | SUBCUTANEOUS | 11 refills | Status: AC
  Filled 2022-05-02: qty 3, 28d supply, fill #0

## 2022-05-05 ENCOUNTER — Other Ambulatory Visit (HOSPITAL_COMMUNITY): Payer: Self-pay

## 2022-05-06 ENCOUNTER — Other Ambulatory Visit (HOSPITAL_COMMUNITY): Payer: Self-pay

## 2022-06-18 DIAGNOSIS — H65191 Other acute nonsuppurative otitis media, right ear: Secondary | ICD-10-CM | POA: Diagnosis not present

## 2022-06-18 DIAGNOSIS — E1140 Type 2 diabetes mellitus with diabetic neuropathy, unspecified: Secondary | ICD-10-CM | POA: Diagnosis not present

## 2022-06-18 DIAGNOSIS — R8281 Pyuria: Secondary | ICD-10-CM | POA: Diagnosis not present

## 2022-08-07 DIAGNOSIS — I69359 Hemiplegia and hemiparesis following cerebral infarction affecting unspecified side: Secondary | ICD-10-CM | POA: Diagnosis not present

## 2022-08-07 DIAGNOSIS — E785 Hyperlipidemia, unspecified: Secondary | ICD-10-CM | POA: Diagnosis not present

## 2022-08-07 DIAGNOSIS — I10 Essential (primary) hypertension: Secondary | ICD-10-CM | POA: Diagnosis not present

## 2022-08-07 DIAGNOSIS — N390 Urinary tract infection, site not specified: Secondary | ICD-10-CM | POA: Diagnosis not present

## 2022-08-07 DIAGNOSIS — E1140 Type 2 diabetes mellitus with diabetic neuropathy, unspecified: Secondary | ICD-10-CM | POA: Diagnosis not present

## 2022-08-07 DIAGNOSIS — Z85038 Personal history of other malignant neoplasm of large intestine: Secondary | ICD-10-CM | POA: Diagnosis not present

## 2022-08-07 DIAGNOSIS — M810 Age-related osteoporosis without current pathological fracture: Secondary | ICD-10-CM | POA: Diagnosis not present

## 2022-08-07 DIAGNOSIS — Z794 Long term (current) use of insulin: Secondary | ICD-10-CM | POA: Diagnosis not present

## 2022-08-07 DIAGNOSIS — R300 Dysuria: Secondary | ICD-10-CM | POA: Diagnosis not present

## 2022-08-27 DIAGNOSIS — R0981 Nasal congestion: Secondary | ICD-10-CM | POA: Diagnosis not present

## 2022-08-27 DIAGNOSIS — J040 Acute laryngitis: Secondary | ICD-10-CM | POA: Diagnosis not present

## 2022-08-27 DIAGNOSIS — J0100 Acute maxillary sinusitis, unspecified: Secondary | ICD-10-CM | POA: Diagnosis not present

## 2022-08-27 DIAGNOSIS — E1122 Type 2 diabetes mellitus with diabetic chronic kidney disease: Secondary | ICD-10-CM | POA: Diagnosis not present

## 2022-08-27 DIAGNOSIS — Z1152 Encounter for screening for COVID-19: Secondary | ICD-10-CM | POA: Diagnosis not present

## 2022-08-27 DIAGNOSIS — R5383 Other fatigue: Secondary | ICD-10-CM | POA: Diagnosis not present

## 2022-09-09 DIAGNOSIS — Z7985 Long-term (current) use of injectable non-insulin antidiabetic drugs: Secondary | ICD-10-CM | POA: Diagnosis not present

## 2022-09-09 DIAGNOSIS — Z823 Family history of stroke: Secondary | ICD-10-CM | POA: Diagnosis not present

## 2022-09-09 DIAGNOSIS — Z8249 Family history of ischemic heart disease and other diseases of the circulatory system: Secondary | ICD-10-CM | POA: Diagnosis not present

## 2022-09-09 DIAGNOSIS — Z8673 Personal history of transient ischemic attack (TIA), and cerebral infarction without residual deficits: Secondary | ICD-10-CM | POA: Diagnosis not present

## 2022-09-09 DIAGNOSIS — Z794 Long term (current) use of insulin: Secondary | ICD-10-CM | POA: Diagnosis not present

## 2022-09-09 DIAGNOSIS — Z85038 Personal history of other malignant neoplasm of large intestine: Secondary | ICD-10-CM | POA: Diagnosis not present

## 2022-09-09 DIAGNOSIS — Z809 Family history of malignant neoplasm, unspecified: Secondary | ICD-10-CM | POA: Diagnosis not present

## 2022-09-09 DIAGNOSIS — Z008 Encounter for other general examination: Secondary | ICD-10-CM | POA: Diagnosis not present

## 2022-09-09 DIAGNOSIS — R030 Elevated blood-pressure reading, without diagnosis of hypertension: Secondary | ICD-10-CM | POA: Diagnosis not present

## 2022-09-09 DIAGNOSIS — Z833 Family history of diabetes mellitus: Secondary | ICD-10-CM | POA: Diagnosis not present

## 2022-09-09 DIAGNOSIS — E1165 Type 2 diabetes mellitus with hyperglycemia: Secondary | ICD-10-CM | POA: Diagnosis not present

## 2022-09-09 DIAGNOSIS — R69 Illness, unspecified: Secondary | ICD-10-CM | POA: Diagnosis not present

## 2022-09-18 DIAGNOSIS — Z1231 Encounter for screening mammogram for malignant neoplasm of breast: Secondary | ICD-10-CM | POA: Diagnosis not present

## 2022-10-13 DIAGNOSIS — E119 Type 2 diabetes mellitus without complications: Secondary | ICD-10-CM | POA: Diagnosis not present

## 2022-10-23 DIAGNOSIS — N390 Urinary tract infection, site not specified: Secondary | ICD-10-CM | POA: Diagnosis not present

## 2022-11-25 DIAGNOSIS — E1140 Type 2 diabetes mellitus with diabetic neuropathy, unspecified: Secondary | ICD-10-CM | POA: Diagnosis not present

## 2022-11-25 DIAGNOSIS — J209 Acute bronchitis, unspecified: Secondary | ICD-10-CM | POA: Diagnosis not present

## 2022-11-25 DIAGNOSIS — R058 Other specified cough: Secondary | ICD-10-CM | POA: Diagnosis not present

## 2023-02-25 DIAGNOSIS — E1122 Type 2 diabetes mellitus with diabetic chronic kidney disease: Secondary | ICD-10-CM | POA: Diagnosis not present

## 2023-02-25 DIAGNOSIS — E785 Hyperlipidemia, unspecified: Secondary | ICD-10-CM | POA: Diagnosis not present

## 2023-02-25 DIAGNOSIS — M810 Age-related osteoporosis without current pathological fracture: Secondary | ICD-10-CM | POA: Diagnosis not present

## 2023-03-12 ENCOUNTER — Telehealth: Payer: Self-pay | Admitting: Internal Medicine

## 2023-03-12 ENCOUNTER — Encounter: Payer: Self-pay | Admitting: Physician Assistant

## 2023-03-12 NOTE — Telephone Encounter (Signed)
Urgent referral in [place] from PCP for rectal bleeding.  Patient stated she is "spotting".  Last saw Dr. Rhea Belton in 2013.  Called [facility] to verify she is has not established with their practice.  Please review and advise scheduling and urgency.  Thanks

## 2023-03-12 NOTE — Telephone Encounter (Signed)
She should see APP urgently; history of colon cancer; attempted colonoscopy in 2021 failed due to intolerance of prep If she does come to reestablish care we will need to discuss prep and likely use metoclopramide before bowel prep

## 2023-03-12 NOTE — Telephone Encounter (Signed)
Called and spoke with patient.  She confirmed appt on 03/19/23.  Paperwork mailed

## 2023-03-12 NOTE — Telephone Encounter (Signed)
Pt scheduled to see Selena Lesser PA 03/19/23 at 3pm. Please notify pt of appt.

## 2023-03-16 DIAGNOSIS — Z794 Long term (current) use of insulin: Secondary | ICD-10-CM | POA: Diagnosis not present

## 2023-03-16 DIAGNOSIS — E1122 Type 2 diabetes mellitus with diabetic chronic kidney disease: Secondary | ICD-10-CM | POA: Diagnosis not present

## 2023-03-16 DIAGNOSIS — I10 Essential (primary) hypertension: Secondary | ICD-10-CM | POA: Diagnosis not present

## 2023-03-16 DIAGNOSIS — E785 Hyperlipidemia, unspecified: Secondary | ICD-10-CM | POA: Diagnosis not present

## 2023-03-19 ENCOUNTER — Ambulatory Visit (INDEPENDENT_AMBULATORY_CARE_PROVIDER_SITE_OTHER)
Admission: RE | Admit: 2023-03-19 | Discharge: 2023-03-19 | Disposition: A | Discharge status: Home / Self Care | Payer: PPO | Source: Ambulatory Visit | Attending: Physician Assistant | Admitting: Physician Assistant

## 2023-03-19 ENCOUNTER — Encounter: Payer: Self-pay | Admitting: Physician Assistant

## 2023-03-19 ENCOUNTER — Ambulatory Visit: Payer: Medicare HMO | Admitting: Physician Assistant

## 2023-03-19 ENCOUNTER — Other Ambulatory Visit (INDEPENDENT_AMBULATORY_CARE_PROVIDER_SITE_OTHER): Payer: Medicare HMO

## 2023-03-19 VITALS — BP 120/80 | HR 75 | Ht 63.0 in | Wt 186.5 lb

## 2023-03-19 DIAGNOSIS — K5904 Chronic idiopathic constipation: Secondary | ICD-10-CM

## 2023-03-19 DIAGNOSIS — Z794 Long term (current) use of insulin: Secondary | ICD-10-CM | POA: Diagnosis not present

## 2023-03-19 DIAGNOSIS — R195 Other fecal abnormalities: Secondary | ICD-10-CM

## 2023-03-19 DIAGNOSIS — E119 Type 2 diabetes mellitus without complications: Secondary | ICD-10-CM | POA: Diagnosis not present

## 2023-03-19 DIAGNOSIS — Z85038 Personal history of other malignant neoplasm of large intestine: Secondary | ICD-10-CM

## 2023-03-19 DIAGNOSIS — R159 Full incontinence of feces: Secondary | ICD-10-CM

## 2023-03-19 DIAGNOSIS — I878 Other specified disorders of veins: Secondary | ICD-10-CM | POA: Diagnosis not present

## 2023-03-19 DIAGNOSIS — R197 Diarrhea, unspecified: Secondary | ICD-10-CM | POA: Diagnosis not present

## 2023-03-19 DIAGNOSIS — R152 Fecal urgency: Secondary | ICD-10-CM

## 2023-03-19 DIAGNOSIS — I69359 Hemiplegia and hemiparesis following cerebral infarction affecting unspecified side: Secondary | ICD-10-CM

## 2023-03-19 DIAGNOSIS — Z01818 Encounter for other preprocedural examination: Secondary | ICD-10-CM

## 2023-03-19 LAB — CBC WITH DIFFERENTIAL/PLATELET
Basophils Absolute: 0 10*3/uL (ref 0.0–0.1)
Basophils Relative: 0.2 % (ref 0.0–3.0)
Eosinophils Absolute: 0.2 10*3/uL (ref 0.0–0.7)
Eosinophils Relative: 3.2 % (ref 0.0–5.0)
HCT: 40.8 % (ref 36.0–46.0)
Hemoglobin: 12.8 g/dL (ref 12.0–15.0)
Lymphocytes Relative: 26.9 % (ref 12.0–46.0)
Lymphs Abs: 2 10*3/uL (ref 0.7–4.0)
MCHC: 31.5 g/dL (ref 30.0–36.0)
MCV: 80.7 fL (ref 78.0–100.0)
Monocytes Absolute: 0.4 10*3/uL (ref 0.1–1.0)
Monocytes Relative: 5.8 % (ref 3.0–12.0)
Neutro Abs: 4.7 10*3/uL (ref 1.4–7.7)
Neutrophils Relative %: 63.9 % (ref 43.0–77.0)
Platelets: 224 10*3/uL (ref 150.0–400.0)
RBC: 5.05 Mil/uL (ref 3.87–5.11)
RDW: 15.2 % (ref 11.5–15.5)
WBC: 7.4 10*3/uL (ref 4.0–10.5)

## 2023-03-19 LAB — COMPREHENSIVE METABOLIC PANEL
ALT: 10 U/L (ref 0–35)
AST: 13 U/L (ref 0–37)
Albumin: 3.8 g/dL (ref 3.5–5.2)
Alkaline Phosphatase: 97 U/L (ref 39–117)
BUN: 17 mg/dL (ref 6–23)
CO2: 30 meq/L (ref 19–32)
Calcium: 9.2 mg/dL (ref 8.4–10.5)
Chloride: 103 meq/L (ref 96–112)
Creatinine, Ser: 0.72 mg/dL (ref 0.40–1.20)
GFR: 87.2 mL/min (ref >60.00)
Glucose, Bld: 271 mg/dL — ABNORMAL HIGH (ref 70–99)
Potassium: 3.8 meq/L (ref 3.5–5.1)
Sodium: 140 meq/L (ref 135–145)
Total Bilirubin: 0.4 mg/dL (ref 0.2–1.2)
Total Protein: 7.3 g/dL (ref 6.0–8.3)

## 2023-03-19 MED ORDER — HYDROCORTISONE ACETATE 25 MG RE SUPP: 25.0000 mg | Freq: Two times a day (BID) | RECTAL | 0 refills | Status: DC

## 2023-03-19 MED ORDER — SUTAB 1479-225-188 MG PO TABS: 24.0000 | ORAL_TABLET | ORAL | 0 refills | Status: AC

## 2023-03-19 MED ORDER — ONDANSETRON HCL 4 MG PO TABS: 4.0000 mg | ORAL_TABLET | Freq: Three times a day (TID) | ORAL | 0 refills | Status: DC | PRN

## 2023-03-19 MED ORDER — HYDROCORTISONE (PERIANAL) 2.5 % EX CREA: 1.0000 | TOPICAL_CREAM | Freq: Two times a day (BID) | CUTANEOUS | 2 refills | Status: DC

## 2023-03-19 MED ORDER — METOCLOPRAMIDE HCL 10 MG PO TABS
ORAL_TABLET | ORAL | 0 refills | Status: DC
Start: 2023-03-19 — End: 2023-11-29

## 2023-03-19 NOTE — Progress Notes (Signed)
03/19/2023 KULSUM ROFFMAN [place] [id number] 1956-03-29  Referring provider: Melida Quitter, MD Primary GI doctor: Dr. Rhea Belton ( Dr. Juanda Chance)  ASSESSMENT AND PLAN:   Constipation with rectal bleeding, fecal incontinence in patient with history of colon cancer s/p colectomy 2002 Worsening over six months, likely secondary to Ozempic use for type 2 diabetes. Large volume soft stool on examination. Failed colonoscopy attempt in May 2021 due to intolerance of prep. FOBT+ -Start MiraLAX daily for constipation. -Plan repeat colonoscopy with two-day prep, including Reglan and Zofran prior to Sutab ingestion, miralax prior. -We have discussed the risks of bleeding, infection, perforation, medication reactions, and remote risk of death associated with colonoscopy. All questions were answered and the patient acknowledges these risk and wishes to proceed. -Order abdominal x-ray to evaluate for obstructive pattern. -Discuss with primary care the risk/benefit of continuing Ozempic.  Rectal Bleeding Positive FOBT and presence of hemorrhoids. History of colon cancer with last colonoscopy in 2012. -Treat with suppositories. -Order CBC to evaluate for anemia given positive FOBT and history of colon cancer.  Pelvic Floor Dysfunction Poor rectal tone on examination, likely contributing to constipation/overflow diarrhea. -Consider referral for pelvic floor physical therapy/manometry after colonoscopy.  Type 2 Diabetes Insulin-dependent, on Ozempic for the last six months. -Discuss with primary care the risk/benefit of continuing Ozempic.  History of CVA Left-sided hemiparesis, walks with a cane. -Continue low dose aspirin.   Patient Care Team: Melida Quitter, MD as PCP - General (Internal Medicine)  HISTORY OF PRESENT ILLNESS: 67 y.o. 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   2012 colonoscopy small polyp 11/06/2011 endoscopy for epigastric pain melenic stools showed normal esophagus, mild gastritis, normal duodenum 03/12/2023 Dr. Rhea Belton received phone call stating patient had history of attempted colonoscopy 10/2019 failed due to intolerance of prep with 2 day miralax/sutab suggested reestablishing care here to discuss prep with metoclopramide use prior.  She had change in her bowel habits with rectal bleeding 6 months ago.  She started to see small volume BRB blood/spotting with Bm's at times associated with straining.  Prior to 6 months ago she was having a BM daily, then 6 months ago when she started to see the BRB she also started to have constipation.  She was having small volume hard stool, with straining, BRB, will have the urge to go worse after eating. Then every 3-4 days she would have AB cramping, large volume loose stools, and fecal incontinence and then she would go back to constipation.  Denies fever, chills, weight loss. She has been gaining weight.  She has been on ozempic for last 6 months Denies GERD, nausea, vomiting.   She denies blood thinner use.  She denies NSAID use.  She denies ETOH use.   She denies tobacco use.  She denies drug use.    She  reports that she has never smoked. She has never used smokeless tobacco. She reports that she does not currently use alcohol. She reports that she does not use drugs.  RELEVANT LABS AND IMAGING:  Results   LABS FOBT: positive      CBC    Component Value Date/Time   WBC 8.0 01/17/2019 0227   RBC 4.34 01/17/2019 0227   HGB 11.5 (L) 01/17/2019 0227   HCT 36.0 01/17/2019 0227  PLT 181 01/17/2019 0227   MCV 82.9 01/17/2019 0227   MCH 26.5 01/17/2019 0227   MCHC 31.9 01/17/2019 0227   RDW 14.3 01/17/2019 0227   LYMPHSABS 1.8 01/15/2019 1043   MONOABS 0.7 01/15/2019 1043   EOSABS 0.0 01/15/2019 1043   BASOSABS 0.0  01/15/2019 1043   No results for input(s): "HGB" in the last 8760 hours.  CMP     Component Value Date/Time   NA 142 01/17/2019 0227   K 3.5 01/17/2019 0227   CL 108 01/17/2019 0227   CO2 24 01/17/2019 0227   GLUCOSE 232 (H) 01/17/2019 0227   BUN 22 01/17/2019 0227   CREATININE 0.68 01/17/2019 0227   CALCIUM 8.2 (L) 01/17/2019 0227   PROT 6.3 (L) 01/16/2019 0259   ALBUMIN 3.3 (L) 01/16/2019 0259   AST 18 01/16/2019 0259   ALT 15 01/16/2019 0259   ALKPHOS 84 01/16/2019 0259   BILITOT 1.0 01/16/2019 0259   GFRNONAA >60 01/17/2019 0227   GFRAA >60 01/17/2019 0227      Latest Ref Rng & Units 01/16/2019    2:59 AM 09/16/2015    3:15 AM 11/06/2011   11:37 PM  Hepatic Function  Total Protein 6.5 - 8.1 g/dL 6.3  6.9  7.1   Albumin 3.5 - 5.0 g/dL 3.3  3.6  3.7   AST 15 - 41 U/L 18  18  13    ALT 0 - 44 U/L 15  14  12    Alk Phosphatase 38 - 126 U/L 84  86  94   Total Bilirubin 0.3 - 1.2 mg/dL 1.0  0.4  0.3       Current Medications:   Current Outpatient Medications (Endocrine & Metabolic):    alendronate (FOSAMAX) 70 MG tablet, Take 70 mg by mouth once a week.   insulin aspart (NOVOLOG) 100 UNIT/ML injection, Inject 30 Units into the skin 2 (two) times daily.    insulin degludec (TRESIBA FLEXTOUCH) 200 UNIT/ML FlexTouch Pen, Inject 50 Units into the skin daily.   Semaglutide, 1 MG/DOSE, (OZEMPIC, 1 MG/DOSE,) 4 MG/3ML SOPN, Inject 1 mg into the skin once a week.  Current Outpatient Medications (Cardiovascular):    EPINEPHrine 0.3 mg/0.3 mL IJ SOAJ injection, Inject 0.3 mLs (0.3 mg total) into the muscle as needed (Tongue swelling, anaphylaxis).   irbesartan (AVAPRO) 75 MG tablet, Take 75 mg by mouth daily.   Current Outpatient Medications (Analgesics):    acetaminophen (TYLENOL) 500 MG tablet, Take 1 tablet (500 mg total) by mouth every 6 (six) hours as needed.   aspirin EC 81 MG tablet, Take 81 mg by mouth daily.   Current Outpatient Medications (Other):    nitrofurantoin,  macrocrystal-monohydrate, (MACROBID) 100 MG capsule, Take 1 capsule (100 mg total) by mouth 2 (two) times daily.   ondansetron (ZOFRAN) 4 MG tablet, Take 1 tablet (4 mg total) by mouth every 8 (eight) hours as needed for nausea or vomiting (take an hour before sutabs and then as needed).   ONETOUCH VERIO test strip,    sulfamethoxazole-trimethoprim (BACTRIM DS) 800-160 MG tablet, Take 1 tablet by mouth 2 (two) times daily.   tobramycin (TOBREX) 0.3 % ophthalmic solution, Place 1 drop into the right eye every 4 (four) hours.   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   Allergies:  Allergies  Allergen Reactions   Glipizide Nausea And Vomiting    REACTION: nausea and vomiting   Ibuprofen Other (See Comments)    REACTION: tongue swells and dyspnea   Iohexol      Code: HIVES, Desc: pt called day after IV contrast c/o red, swollen itching feet and red face that started 4hrs after ct, Onset Date: [id number]    Rosiglitazone-Metformin     REACTION: rash--tolerates Metformin alone   Hydrocodone-Acetaminophen Nausea And Vomiting and Rash    REACTION: vomiting and rash   Morphine Rash    REACTION: rash   Pantoprazole Palpitations     Surgical History:  She  has a past surgical history that includes Colon surgery; Esophagogastroduodenoscopy (11/07/2011); Cholecystectomy; Patellar tendon repair (N/A, 01/15/2019); Upper gastrointestinal endoscopy; Colonoscopy; and Partial hysterectomy. Family History:  Her family history includes Cancer in an other family member; Colon cancer (age of onset: 26) in her mother; Diabetes in an other family member.  REVIEW OF SYSTEMS  : All other systems reviewed and negative except where  noted in the History of Present Illness.  PHYSICAL EXAM: BP 120/80   Pulse 75   Ht 5\' 3"  (1.6 m)   Wt 186 lb 8 oz (84.6 kg)   BMI 33.04 kg/m  General Appearance: elderly appearing, NAD Head:   Normocephalic and atraumatic. Eyes:  sclerae anicteric,conjunctive pink  Respiratory: Respiratory effort normal, BS equal bilaterally without rales, rhonchi, wheezing. Cardio: RRR with no MRGs. Peripheral pulses intact.  Abdomen: Soft,  Obese ,active bowel sounds. No tenderness . Without guarding and Without rebound. No masses. Rectal: Normal external rectal exam, decreased rectal tone, appreciated internal hemorrhoids, non-tender, no masses, , large volume soft brown stool, hemoccult very Positive Musculoskeletal: Full ROM, Antalgic gait with cane. With edema. Skin:  Dry and intact without significant lesions or rashes Neuro: Alert and  oriented x4;  No focal deficits. Psych:  Cooperative. Normal mood and affect.    Doree Albee, PA-C 3:44 PM

## 2023-03-19 NOTE — Patient Instructions (Addendum)
I'm want you to start on the miralax with fiber daily Talk with your doctor about the ozempic We will give you reglan 30 mins before your prep and zofran to take about an hour before the tablets.   Your provider has requested that you have an abdominal x ray before leaving today. Please go to the basement floor to our Radiology department for the test.  Miralax is an osmotic laxative.  It only brings more water into the stool.  This is safe to take daily.  Can take up to 17 gram of miralax twice a day.  Mix with juice or coffee.  Start 1 capful at night for 3-4 days and reassess your response in 3-4 days.  You can increase and decrease the dose based on your response.  Remember, it can take up to 3-4 days to take effect OR for the effects to wear off.   I often pair this with benefiber in the morning to help assure the stool is not too loose.   Toileting tips to help with your constipation - Drink at least 64-80 ounces of water/liquid per day. - Establish a time to try to move your bowels every day.  For many people, this is after a cup of coffee or after a meal such as breakfast. - Sit all of the way back on the toilet keeping your back fairly straight and while sitting up, try to rest the tops of your forearms on your upper thighs.   - Raising your feet with a step stool/squatty potty can be helpful to improve the angle that allows your stool to pass through the rectum. - Relax the rectum feeling it bulge toward the toilet water.  If you feel your rectum raising toward your body, you are contracting rather than relaxing. - Breathe in and slowly exhale. "Belly breath" by expanding your belly towards your belly button. Keep belly expanded as you gently direct pressure down and back to the anus.  A low pitched GRRR sound can assist with increasing intra-abdominal pressure.  (Can also trying to blow on a pinwheel and make it move, this helps with the same belly breathing) - Repeat 3-4 times.  If unsuccessful, contract the pelvic floor to restore normal tone and get off the toilet.  Avoid excessive straining. - To reduce excessive wiping by teaching your anus to normally contract, place hands on outer aspect of knees and resist knee movement outward.  Hold 5-10 second then place hands just inside of knees and resist inward movement of knees.  Hold 5 seconds.  Repeat a few times each way.  Go to the ER if unable to pass gas, severe AB pain, unable to hold down food, any shortness of breath of chest pain.  Here some information about pelvic floor dysfunction. This may be contributing to some of your symptoms. We will continue with our evaluation but I do want you to consider adding on fiber supplement with low-dose MiraLAX daily. We could also refer to pelvic floor physical therapy.   Pelvic Floor Dysfunction, Female Pelvic floor dysfunction (PFD) is a condition that results when the group of muscles and connective tissues that support the organs in the pelvis (pelvic floor muscles) do not work well. These muscles and their connections form a sling that supports the colon and bladder. In women, they also support the uterus. PFD causes pelvic floor muscles to be too weak, too tight, or both. In PFD, muscle movements are not coordinated. This may cause bowel  or bladder problems. It may also cause pain. What are the causes? This condition may be caused by an injury to the pelvic area or by a weakening of pelvic muscles. This often results from pregnancy and childbirth or other types of strain. In many cases, the exact cause is not known. What increases the risk? The following factors may make you more likely to develop this condition: Having chronic bladder tissue inflammation (interstitial cystitis). Being an older person. Being overweight. History of radiation treatment for cancer in the pelvic region. Previous pelvic surgery, such as removal of the uterus (hysterectomy). What are the  signs or symptoms? Symptoms of this condition vary and may include: Bladder symptoms, such as: Trouble starting urination and emptying the bladder. Frequent urinary tract infections. Leaking urine when coughing, laughing, or exercising (stress incontinence). Having to pass urine urgently or frequently. Pain when passing urine. Bowel symptoms, such as: Constipation. Urgent or frequent bowel movements. Incomplete bowel movements. Painful bowel movements. Leaking stool or gas. Unexplained genital or rectal pain. Genital or rectal muscle spasms. Low back pain. Other symptoms may include: A heavy, full, or aching feeling in the vagina. A bulge that protrudes into the vagina. Pain during or after sex. How is this diagnosed? This condition may be diagnosed based on: Your symptoms and medical history. A physical exam. During the exam, your health care provider may check your pelvic muscles for tightness, spasm, pain, or weakness. This may include a rectal exam and a pelvic exam. In some cases, you may have diagnostic tests, such as: Electrical muscle function tests. Urine flow testing. X-ray tests of bowel function. Ultrasound of the pelvic organs. How is this treated? Treatment for this condition depends on the symptoms. Treatment options include: Physical therapy. This may include Kegel exercises to help relax or strengthen the pelvic floor muscles. Biofeedback. This type of therapy provides feedback on how tight your pelvic floor muscles are so that you can learn to control them. Internal or external massage therapy. A treatment that involves electrical stimulation of the pelvic floor muscles to help control pain (transcutaneous electrical nerve stimulation, or TENS). Sound wave therapy (ultrasound) to reduce muscle spasms. Medicines, such as: Muscle relaxants. Bladder control medicines. Surgery to reconstruct or support pelvic floor muscles may be an option if other treatments do  not help. Follow these instructions at home: Activity Do your usual activities as told by your health care provider. Ask your health care provider if you should modify any activities. Do pelvic floor strengthening or relaxing exercises at home as told by your physical therapist. Lifestyle Maintain a healthy weight. Eat foods that are high in fiber, such as beans, whole grains, and fresh fruits and vegetables. Limit foods that are high in fat and processed sugars, such as fried or sweet foods. Manage stress with relaxation techniques such as yoga or meditation. General instructions If you have problems with leakage: Use absorbable pads or wear padded underwear. Wash frequently with mild soap. Keep your genital and anal area as clean and dry as possible. Ask your health care provider if you should try a barrier cream to prevent skin irritation. Take warm baths to relieve pelvic muscle tension or spasms. Take over-the-counter and prescription medicines only as told by your health care provider. Keep all follow-up visits. How is this prevented? The cause of PFD is not always known, but there are a few things you can do to reduce the risk of developing this condition, including: Staying at a healthy weight. Getting  regular exercise. Managing stress. Contact a health care provider if: Your symptoms are not improving with home care. You have signs or symptoms of PFD that get worse at home. You develop new signs or symptoms. You have signs of a urinary tract infection, such as: Fever. Chills. Increased urinary frequency. A burning feeling when urinating. You have not had a bowel movement in 3 days (constipation). Summary Pelvic floor dysfunction results when the muscles and connective tissues in your pelvic floor do not work well. These muscles and their connections form a sling that supports your colon and bladder. In women, they also support the uterus. PFD may be caused by an injury  to the pelvic area or by a weakening of pelvic muscles. PFD causes pelvic floor muscles to be too weak, too tight, or a combination of both. Symptoms may vary from person to person. In most cases, PFD can be treated with physical therapies and medicines. Surgery may be an option if other treatments do not help. This information is not intended to replace advice given to you by your health care provider. Make sure you discuss any questions you have with your health care provider. Document Revised: 09/26/2020 Document Reviewed: 09/26/2020 Elsevier Patient Education  2022 [employer].  You have been scheduled for a colonoscopy. Please follow written instructions given to you at your visit today.   Please pick up your prep supplies at the pharmacy within the next 1-3 days.  If you use inhalers (even only as needed), please bring them with you on the day of your procedure.  DO NOT TAKE 7 DAYS PRIOR TO TEST- Trulicity (dulaglutide) Ozempic, Wegovy (semaglutide) Mounjaro (tirzepatide) Bydureon Bcise (exanatide extended release)  DO NOT TAKE 1 DAY PRIOR TO YOUR TEST Rybelsus (semaglutide) Adlyxin (lixisenatide) Victoza (liraglutide) Byetta (exanatide) ___________________________________________________________________________ _______________________________________________________  If your blood pressure at your visit was 140/90 or greater, please contact your primary care physician to follow up on this.  _______________________________________________________  If you are age 66 or older, your body mass index should be between 23-30. Your Body mass index is 33.04 kg/m. If this is out of the aforementioned range listed, please consider follow up with your Primary Care Provider.  If you are age 69 or younger, your body mass index should be between 19-25. Your Body mass index is 33.04 kg/m. If this is out of the aformentioned range listed, please consider follow up with your Primary Care  Provider.   ________________________________________________________  The [facility] providers would like to encourage you to use [facility] to communicate with providers for non-urgent requests or questions.  Due to long hold times on the telephone, sending your provider a message by [facility] may be a faster and more efficient way to get a response.  Please allow 48 business hours for a response.  Please remember that this is for non-urgent requests.  _______________________________________________________

## 2023-03-30 NOTE — Progress Notes (Signed)
Addendum: Reviewed and agree with assessment and management plan. Kadijah Shamoon M, MD  

## 2023-04-10 ENCOUNTER — Encounter: Payer: Self-pay | Admitting: Internal Medicine

## 2023-04-21 ENCOUNTER — Telehealth: Payer: Self-pay | Admitting: Internal Medicine

## 2023-04-21 NOTE — Telephone Encounter (Signed)
Inbound call from patient , calling to cancel her procedure for tomorrow at 8:00 AM. Patient states she is sick and has a runny nose, patient rescheduled for 1/14 at 4:00 PM.

## 2023-04-22 ENCOUNTER — Encounter: Payer: PPO | Admitting: Internal Medicine

## 2023-05-14 ENCOUNTER — Ambulatory Visit: Payer: Medicare HMO

## 2023-05-14 VITALS — Ht 63.0 in | Wt 186.0 lb

## 2023-05-14 DIAGNOSIS — R159 Full incontinence of feces: Secondary | ICD-10-CM

## 2023-05-14 DIAGNOSIS — Z85038 Personal history of other malignant neoplasm of large intestine: Secondary | ICD-10-CM

## 2023-05-14 DIAGNOSIS — R152 Fecal urgency: Secondary | ICD-10-CM

## 2023-05-14 DIAGNOSIS — R195 Other fecal abnormalities: Secondary | ICD-10-CM

## 2023-05-14 DIAGNOSIS — E119 Type 2 diabetes mellitus without complications: Secondary | ICD-10-CM

## 2023-05-14 DIAGNOSIS — Z794 Long term (current) use of insulin: Secondary | ICD-10-CM

## 2023-05-14 DIAGNOSIS — K5904 Chronic idiopathic constipation: Secondary | ICD-10-CM

## 2023-05-14 NOTE — Progress Notes (Signed)
No egg or soy allergy known to patient  No issues known to pt with past sedation with any surgeries or procedures Patient denies ever being told they had issues or difficulty with intubation  No FH of Malignant Hyperthermia Pt is not on diet pills Pt is not on  home 02  Pt is not on blood thinners  Pt denies issues with constipation  No A fib or A flutter Have any cardiac testing pending-- no  LOA: independent  Prep:  2 day sutab and miralax   Patient's chart reviewed by Cathlyn Parsons CNRA prior to previsit and patient appropriate for the [facility].  Previsit completed and red dot placed by patient's name on their procedure day (on provider's schedule).     PV competed with patient. Prep instructions sent via mychart and home address. Pt has prep supplies from [facility] per pt

## 2023-06-11 ENCOUNTER — Encounter: Payer: Self-pay | Admitting: Internal Medicine

## 2023-06-16 ENCOUNTER — Encounter: Payer: PPO | Admitting: Internal Medicine

## 2023-07-08 ENCOUNTER — Telehealth: Payer: Self-pay | Admitting: Internal Medicine

## 2023-07-08 NOTE — Telephone Encounter (Signed)
 Patient called and stated that she had a toothache and was prescribed an antibiotic. Patient is wanting to know if it is ok to still have her procedure on Friday Feb 7th. Patient is requesting a call back. Please advise.

## 2023-07-08 NOTE — Telephone Encounter (Signed)
 Spoke with pt and she is aware of Dr. Marietta Shorter recommendations that she is ok to proceed with colonoscopy.

## 2023-07-08 NOTE — Telephone Encounter (Signed)
 Should be okay to proceed as scheduled

## 2023-07-08 NOTE — Telephone Encounter (Signed)
 Pt states she has been placed on an antibiotic for a "bad tooth", they plan to extract the tooth on Monday. Pt wants to know if she is still ok to proceed with Colon that is scheduled for 07/10/23. Please advise.

## 2023-07-10 ENCOUNTER — Encounter: Payer: PPO | Admitting: Internal Medicine

## 2023-08-18 ENCOUNTER — Encounter: Payer: Self-pay | Admitting: Certified Registered Nurse Anesthetist

## 2023-08-24 ENCOUNTER — Telehealth: Payer: Self-pay | Admitting: Internal Medicine

## 2023-08-24 NOTE — Telephone Encounter (Signed)
 Patient called stated her son is at the hospital and she will need to cancel her procedure for tomorrow.

## 2023-08-25 ENCOUNTER — Encounter: Payer: Medicare (Managed Care) | Admitting: Internal Medicine

## 2023-11-29 ENCOUNTER — Ambulatory Visit (HOSPITAL_COMMUNITY)
Admission: EM | Admit: 2023-11-29 | Discharge: 2023-11-29 | Disposition: A | Discharge status: Home / Self Care | Attending: Emergency Medicine | Admitting: Emergency Medicine

## 2023-11-29 ENCOUNTER — Encounter (HOSPITAL_COMMUNITY): Payer: Self-pay

## 2023-11-29 DIAGNOSIS — L03116 Cellulitis of left lower limb: Secondary | ICD-10-CM | POA: Diagnosis not present

## 2023-11-29 DIAGNOSIS — L089 Local infection of the skin and subcutaneous tissue, unspecified: Secondary | ICD-10-CM

## 2023-11-29 DIAGNOSIS — T148XXA Other injury of unspecified body region, initial encounter: Secondary | ICD-10-CM

## 2023-11-29 DIAGNOSIS — R21 Rash and other nonspecific skin eruption: Secondary | ICD-10-CM | POA: Diagnosis not present

## 2023-11-29 DIAGNOSIS — L97309 Non-pressure chronic ulcer of unspecified ankle with unspecified severity: Secondary | ICD-10-CM

## 2023-11-29 DIAGNOSIS — E11622 Type 2 diabetes mellitus with other skin ulcer: Secondary | ICD-10-CM | POA: Diagnosis not present

## 2023-11-29 MED ORDER — SULFAMETHOXAZOLE-TRIMETHOPRIM 800-160 MG PO TABS: 2.0000 | ORAL_TABLET | Freq: Two times a day (BID) | ORAL | 0 refills | Status: AC

## 2023-11-29 MED ORDER — METHYLPREDNISOLONE 4 MG PO TBPK
ORAL_TABLET | ORAL | 0 refills | Status: AC
Start: 2023-11-29 — End: ?

## 2023-11-29 MED ORDER — TRIAMCINOLONE ACETONIDE 0.1 % EX OINT
1.0000 | TOPICAL_OINTMENT | Freq: Two times a day (BID) | CUTANEOUS | 0 refills | Status: AC
Start: 2023-11-29 — End: ?

## 2023-11-29 NOTE — Discharge Instructions (Signed)
 Please read below to learn more about the medications, dosages and frequencies that I recommend to help alleviate your symptoms and to get you feeling better soon:   Bactrim  (trimethoprim  sulfamethoxazole ): Please take two (2) tablets twice daily for 7 days.     Medrol  Dosepak (methylprednisolone ): This is a steroid that will significantly calm your upper and lower airways.  Please take one row of tablets daily with your breakfast meal starting tomorrow morning until the prescription is complete.      Topical Kenalog (triamcinolone): This is a topical steroid that can be applied to the affected area of the skin twice daily.  Please use it exactly as directed by the prescription instructions.  Different strengths of topical steroids are used to treat different areas of the body so it is important that this steroid is only applied to the areas for which it has been prescribed.  Unnecessary exposure to topical steroids can cause bleaching of the skin which can be permanent so please immediately discontinue use of this topical steroid once the skin condition is resolved.  If symptoms have not meaningfully improved in the next 5 to 7 days, please return for repeat evaluation or follow-up with your regular provider.  If symptoms have worsened in the next 3 to 5 days, please go to the emergency room for further evaluation.    Thank you for visiting urgent care today.  We appreciate the opportunity to participate in your care.

## 2023-11-29 NOTE — ED Triage Notes (Signed)
 Patient reports that she has had a rash to both shoulders,back, chest, and bilateral upper thighs x 3 days.  Patient states she has been using hydrocortisone  cream.   Patient also c/o of pain, redness, and swelling to the left outer ankle. Patient states she had a bubble to the area and it burst 2 days ago. Patient states she has had increased pain since the blister burst.  Patient states she has been putting apple cider vinegar on the area.

## 2023-11-29 NOTE — ED Provider Notes (Signed)
 [facility]    CSN: [id number] Arrival date & time: 11/29/23  1141    HISTORY   Chief Complaint  Patient presents with   Rash   HPI [name] Fire Imagene is a pleasant, 68 y.o. female who presents to urgent care today. Patient complains of a 3-day history of progressively spreading rash that began on her chest, radiated to her shoulders, upper back, underarms and torso.  Patient states she been using hydrocortisone  cream without meaningful relief.  Patient states she is never had a rash but similar in the past.  Patient also complains of a blister bursting on the back of her left lower leg 2 days ago, states that for the came out of it was clear.  Patient states that since the blister burst, she has had progressively worsening pain, redness and swelling to the left lateral and posterior lower leg.  Patient states has been putting apple cider vinegar on the lesion without meaningful relief.  The history is provided by the patient.  Rash  Past Medical History:  Diagnosis Date   Anemia    Colon cancer (HCC)    Diabetes mellitus    GERD (gastroesophageal reflux disease)    Hyperlipidemia    Stroke 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 tx   Patient Active Problem List   Diagnosis Date Noted   CVA, old, hemiparesis (HCC) 01/16/2019   Type 2 diabetes mellitus without complication, with long-term current use of insulin  (HCC) 01/16/2019   Right patella fracture 01/16/2019   Patellar fracture 01/15/2019   Rectal bleeding 04/10/2012   Supratherapeutic INR 04/10/2012   Heme positive stool 04/10/2012   Melena 11/07/2011   Coagulopathy (HCC) 11/07/2011   H/O colon cancer 11/07/2011   OTHER ANAPHYLACTIC REACTION 06/20/2010   ULCERATION OF INTESTINE 04/05/2010   ANEMIA 04/02/2010   SCABIES 07/06/2009   UPPER RESPIRATORY INFECTION 07/06/2009   PRURITUS, VAGINAL 01/25/2009   URINARY INCONTINENCE 01/25/2009   CANDIDIASIS, VAGINAL 11/06/2008    EXTERNAL OTITIS 07/26/2008   CHEST PAIN 05/05/2008   EPISTAXIS 10/07/2007   History of colonic polyps 02/09/2007   ANEMIA, B12 DEFICIENCY 01/19/2007   SINUSITIS, ACUTE NOS 01/19/2007   HYPERLIPIDEMIA 11/10/2006   Essential hypertension 11/10/2006   Unspecified cerebral artery occlusion with cerebral infarction 06/02/2001   PATENT FORAMEN OVALE 04/21/2001   DIABETES MELLITUS, TYPE II, UNCONTROLLED 09/01/1999   Past Surgical History:  Procedure Laterality Date   CHOLECYSTECTOMY     COLON SURGERY     COLONOSCOPY     2012   ESOPHAGOGASTRODUODENOSCOPY  11/07/2011   Procedure: ESOPHAGOGASTRODUODENOSCOPY (EGD);  Surgeon: Gordy CHRISTELLA Starch, MD;  Location: [facility];  Service: Gastroenterology;  Laterality: N/A;   PARTIAL HYSTERECTOMY     PATELLAR TENDON REPAIR N/A 01/15/2019   Procedure: PATELLA TENDON REPAIR;  Surgeon: Dozier Soulier, MD;  Location: [facility];  Service: Orthopedics;  Laterality: N/A;   UPPER GASTROINTESTINAL ENDOSCOPY     OB History   No obstetric history on file.    Home Medications    Prior to Admission medications   Medication Sig Start Date End Date Taking? Authorizing Provider  acetaminophen  (TYLENOL ) 500 MG tablet Take 1 tablet (500 mg total) by mouth every 6 (six) hours as needed. 08/13/14   Nivia Colon, PA-C  alendronate (FOSAMAX) 70 MG tablet Take 70 mg by mouth once a week. Patient not taking: Reported on 05/14/2023 02/13/22   [provider]  aspirin  EC 81  MG tablet Take 81 mg by mouth daily.    [provider]  EPINEPHrine  0.3 mg/0.3 mL IJ SOAJ injection Inject 0.3 mLs (0.3 mg total) into the muscle as needed (Tongue swelling, anaphylaxis). 08/16/14   Elpidio Lamp, MD  hydrocortisone  (ANUSOL -HC) 2.5 % rectal cream Place 1 Application rectally 2 (two) times daily. Patient not taking: Reported on 05/14/2023 03/19/23   Craig Alan SAUNDERS, PA-C  hydrocortisone  (ANUSOL -HC) 25 MG suppository Place 1 suppository (25 mg total) rectally 2 (two) times  daily. Patient not taking: Reported on 05/14/2023 03/19/23   Craig Alan SAUNDERS, PA-C  insulin  aspart (NOVOLOG ) 100 UNIT/ML injection Inject 30 Units into the skin 2 (two) times daily.     [provider]  insulin  degludec (TRESIBA FLEXTOUCH) 200 UNIT/ML FlexTouch Pen Inject 50 Units into the skin daily. 04/30/22   [provider]  irbesartan (AVAPRO) 75 MG tablet Take 75 mg by mouth daily. 10/10/21   [provider]  metoCLOPramide  (REGLAN ) 10 MG tablet Take as directed per prep instructions Patient not taking: Reported on 05/14/2023 03/19/23   Craig Alan SAUNDERS, PA-C  nitrofurantoin , macrocrystal-monohydrate, (MACROBID ) 100 MG capsule Take 1 capsule (100 mg total) by mouth 2 (two) times daily. Patient not taking: Reported on 05/14/2023 03/17/22   Neldon Hamp RAMAN, PA  ondansetron  (ZOFRAN ) 4 MG tablet Take 1 tablet (4 mg total) by mouth every 8 (eight) hours as needed for nausea or vomiting (take an hour before sutabs and then as needed). Patient not taking: Reported on 05/14/2023 03/19/23   Craig Alan SAUNDERS, PA-C  [facility] VERIO test strip  09/23/19   [provider]  Pitavastatin Calcium 1 MG TABS Take 1 tablet by mouth daily. 09/07/21   [provider]  Semaglutide , 1 MG/DOSE, (OZEMPIC , 1 MG/DOSE,) 4 MG/3ML SOPN Inject 1 mg into the skin once a week. 05/02/22     Sodium Sulfate-Mag Sulfate-KCl (SUTAB ) 219 [id number] 4230 MG TABS Take 24 tablets by mouth as directed. 03/19/23   Craig Alan SAUNDERS, PA-C  sulfamethoxazole -trimethoprim  (BACTRIM  DS) 800-160 MG tablet Take 1 tablet by mouth 2 (two) times daily. Patient not taking: Reported on 05/14/2023 11/08/19   [provider]  tobramycin  (TOBREX ) 0.3 % ophthalmic solution Place 1 drop into the right eye every 4 (four) hours. Patient not taking: Reported on 05/14/2023 01/23/20   Maranda Jamee Jacob, MD    Family History Family History  Problem Relation Age of Onset   Colon cancer Mother 46   Cancer Other     Diabetes Other    Colon polyps Neg Hx    Esophageal cancer Neg Hx    Stomach cancer Neg Hx    Rectal cancer Neg Hx    Social History Social History   Tobacco Use   Smoking status: Never   Smokeless tobacco: Never  Vaping Use   Vaping status: Never Used  Substance Use Topics   Alcohol  use: Not Currently    Comment: occasional glass of wine   Drug use: No   Allergies   Ibuprofen, Iohexol, Glipizide, Hydrocodone, Morphine, Pantoprazole , and Rosiglitazone  Review of Systems Review of Systems  Skin:  Positive for rash.   Pertinent findings revealed after performing a 14 point review of systems has been noted in the history of present illness.  Physical Exam Vital Signs BP 110/60 (BP Location: Left Arm)   Pulse 81   Temp 98.1 F (36.7 C) (Oral)   Resp 16   SpO2 92%   No data found.  Physical Exam Vitals  and nursing note reviewed. Exam conducted with a chaperone present.  Constitutional:      General: She is awake. She is not in acute distress.    Appearance: Normal appearance. She is well-developed and well-groomed. She is not ill-appearing.   Cardiovascular:     Rate and Rhythm: Normal rate and regular rhythm.  Pulmonary:     Effort: Pulmonary effort is normal.   Musculoskeletal:     Cervical back: Full passive range of motion without pain, normal range of motion and neck supple.   Skin:    Findings: Rash (Please see photos below.) and wound (Please see photo below, likely diabetic with surrounding erythema, induration, warmth to touch, without drainage, area is tender to palpation) present.   Neurological:     Mental Status: She is alert.   Psychiatric:        Behavior: Behavior is cooperative.              UC Couse / Diagnostics / Procedures:     Radiology No results found.  Procedures Procedures (including critical care time) EKG  Pending results:  Labs Reviewed - No data to display  Medications Ordered in UC: Medications - No  data to display  UC Diagnoses / Final Clinical Impressions(s)   I have reviewed the triage vital signs and the nursing notes.  Pertinent labs & imaging results that were available during my care of the patient were reviewed by me and considered in my medical decision making (see chart for details).    Final diagnoses:  Diabetic ulcer of ankle (HCC)  Wound infection  Cellulitis of left lower extremity  Rash and nonspecific skin eruption   For rash of uncertain etiology, patient was provided with a Medrol  Dosepak and topical steroid.  Because patient has been scratching and there is clearly evidence of superficial infection secondary to her scratching, patient will also benefit from the antibiotic prescribed for treatment of what appears to be an infected diabetic wound on her left lower extremity.  Bactrim  chosen for MRSA coverage.  Patient discouraged from using apple cider vinegar on the lesion.  Conservative care recommended.  Return cautions advised.  Please see discharge instructions below for details of plan of care as provided to patient. ED Prescriptions     Medication Sig Dispense Auth. Provider   sulfamethoxazole -trimethoprim  (BACTRIM  DS) 800-160 MG tablet Take 2 tablets by mouth 2 (two) times daily for 7 days. 28 tablet Joesph Shaver Scales, PA-C   methylPREDNISolone  (MEDROL  DOSEPAK) 4 MG TBPK tablet Take 24 mg on day 1, 20 mg on day 2, 16 mg on day 3, 12 mg on day 4, 8 mg on day 5, 4 mg on day 6.  Take all tablets in each row at once, do not spread tablets out throughout the day. 21 tablet Joesph Shaver Scales, PA-C   triamcinolone ointment (KENALOG) 0.1 % Apply 1 Application topically 2 (two) times daily. Apply to affected area(s) twice daily , do not apply to face. 80 g Joesph Shaver Scales, PA-C      PDMP not reviewed this encounter.  Pending results:  Labs Reviewed - No data to display    Discharge Instructions      Please read below to learn more about the  medications, dosages and frequencies that I recommend to help alleviate your symptoms and to get you feeling better soon:   Bactrim  (trimethoprim  sulfamethoxazole ): Please take two (2) tablets twice daily for 7 days.     Medrol  Dosepak (methylprednisolone ):  This is a steroid that will significantly calm your upper and lower airways.  Please take one row of tablets daily with your breakfast meal starting tomorrow morning until the prescription is complete.      Topical Kenalog (triamcinolone): This is a topical steroid that can be applied to the affected area of the skin twice daily.  Please use it exactly as directed by the prescription instructions.  Different strengths of topical steroids are used to treat different areas of the body so it is important that this steroid is only applied to the areas for which it has been prescribed.  Unnecessary exposure to topical steroids can cause bleaching of the skin which can be permanent so please immediately discontinue use of this topical steroid once the skin condition is resolved.  If symptoms have not meaningfully improved in the next 5 to 7 days, please return for repeat evaluation or follow-up with your regular provider.  If symptoms have worsened in the next 3 to 5 days, please go to the emergency room for further evaluation.    Thank you for visiting urgent care today.  We appreciate the opportunity to participate in your care.       Disposition Upon Discharge:  Condition: stable for discharge home  Patient presented with an acute illness with associated systemic symptoms and significant discomfort requiring urgent management. In my opinion, this is a condition that a prudent lay person (someone who possesses an average knowledge of health and medicine) may potentially expect to result in complications if not addressed urgently such as respiratory distress, impairment of bodily function or dysfunction of bodily organs.   Routine symptom  specific, illness specific and/or disease specific instructions were discussed with the patient and/or caregiver at length.   As such, the patient has been evaluated and assessed, work-up was performed and treatment was provided in alignment with urgent care protocols and evidence based medicine.  Patient/parent/caregiver has been advised that the patient may require follow up for further testing and treatment if the symptoms continue in spite of treatment, as clinically indicated and appropriate.  Patient/parent/caregiver has been advised to return to the [facility] or PCP if no better; to PCP or the Emergency Department if new signs and symptoms develop, or if the current signs or symptoms continue to change or worsen for further workup, evaluation and treatment as clinically indicated and appropriate  The patient will follow up with their current PCP if and as advised. If the patient does not currently have a PCP we will assist them in obtaining one.   The patient may need specialty follow up if the symptoms continue, in spite of conservative treatment and management, for further workup, evaluation, consultation and treatment as clinically indicated and appropriate.  Patient/parent/caregiver verbalized understanding and agreement of plan as discussed.  All questions were addressed during visit.  Please see discharge instructions below for further details of plan.  This office note has been dictated using Teaching laboratory technician.  Unfortunately, this method of dictation can sometimes lead to typographical or grammatical errors.  I apologize for your inconvenience in advance if this occurs.  Please do not hesitate to reach out to me if clarification is needed.      Joesph Shaver Scales, PA-C 11/29/23 1315

## 2024-01-26 ENCOUNTER — Other Ambulatory Visit: Payer: Self-pay

## 2024-01-26 DIAGNOSIS — N390 Urinary tract infection, site not specified: Secondary | ICD-10-CM

## 2024-01-27 ENCOUNTER — Other Ambulatory Visit

## 2024-02-02 ENCOUNTER — Other Ambulatory Visit

## 2024-02-03 ENCOUNTER — Ambulatory Visit
Admission: RE | Admit: 2024-02-03 | Discharge: 2024-02-03 | Disposition: A | Discharge status: Home / Self Care | Source: Ambulatory Visit

## 2024-02-03 ENCOUNTER — Other Ambulatory Visit

## 2024-02-03 DIAGNOSIS — N390 Urinary tract infection, site not specified: Secondary | ICD-10-CM

## 2024-05-04 ENCOUNTER — Other Ambulatory Visit (HOSPITAL_BASED_OUTPATIENT_CLINIC_OR_DEPARTMENT_OTHER): Payer: Self-pay | Admitting: Nurse Practitioner

## 2024-05-04 DIAGNOSIS — E2839 Other primary ovarian failure: Secondary | ICD-10-CM
